# Patient Record
Sex: Female | Born: 1965 | Race: Black or African American | Hispanic: No | Marital: Single
Health system: Midwestern US, Community
[De-identification: ages and names within clinical notes are randomized; demographics above are authoritative.]

## PROBLEM LIST (undated history)

## (undated) DIAGNOSIS — Z87891 Personal history of nicotine dependence: Secondary | ICD-10-CM

## (undated) DIAGNOSIS — F319 Bipolar disorder, unspecified: Secondary | ICD-10-CM

## (undated) DIAGNOSIS — Z7901 Long term (current) use of anticoagulants: Secondary | ICD-10-CM

## (undated) DIAGNOSIS — I2699 Other pulmonary embolism without acute cor pulmonale: Secondary | ICD-10-CM

## (undated) DIAGNOSIS — K219 Gastro-esophageal reflux disease without esophagitis: Secondary | ICD-10-CM

## (undated) DIAGNOSIS — Z8739 Personal history of other diseases of the musculoskeletal system and connective tissue: Secondary | ICD-10-CM

## (undated) DIAGNOSIS — M545 Low back pain, unspecified: Secondary | ICD-10-CM

## (undated) DIAGNOSIS — J309 Allergic rhinitis, unspecified: Secondary | ICD-10-CM

## (undated) DIAGNOSIS — I509 Heart failure, unspecified: Secondary | ICD-10-CM

## (undated) DIAGNOSIS — I10 Essential (primary) hypertension: Secondary | ICD-10-CM

## (undated) DIAGNOSIS — M5136 Other intervertebral disc degeneration, lumbar region: Secondary | ICD-10-CM

## (undated) DIAGNOSIS — M51369 Other intervertebral disc degeneration, lumbar region without mention of lumbar back pain or lower extremity pain: Secondary | ICD-10-CM

## (undated) DIAGNOSIS — G8929 Other chronic pain: Secondary | ICD-10-CM

## (undated) DIAGNOSIS — J449 Chronic obstructive pulmonary disease, unspecified: Secondary | ICD-10-CM

## (undated) DIAGNOSIS — M797 Fibromyalgia: Secondary | ICD-10-CM

## (undated) DIAGNOSIS — E669 Obesity, unspecified: Secondary | ICD-10-CM

## (undated) DIAGNOSIS — M419 Scoliosis, unspecified: Secondary | ICD-10-CM

## (undated) DIAGNOSIS — E66811 Obesity, class 1: Secondary | ICD-10-CM

## (undated) DIAGNOSIS — E785 Hyperlipidemia, unspecified: Secondary | ICD-10-CM

## (undated) DIAGNOSIS — I2510 Atherosclerotic heart disease of native coronary artery without angina pectoris: Secondary | ICD-10-CM

## (undated) DIAGNOSIS — I2602 Saddle embolus of pulmonary artery with acute cor pulmonale: Principal | ICD-10-CM

## (undated) DIAGNOSIS — R319 Hematuria, unspecified: Secondary | ICD-10-CM

## (undated) DIAGNOSIS — R52 Pain, unspecified: Secondary | ICD-10-CM

## (undated) HISTORY — DX: Hyperlipidemia, unspecified: E78.5

## (undated) HISTORY — PX: ABDOMINAL HYSTERECTOMY: SHX81

## (undated) HISTORY — DX: Other chronic pain: G89.29

## (undated) HISTORY — DX: Gastro-esophageal reflux disease without esophagitis: K21.9

## (undated) HISTORY — DX: Low back pain, unspecified: M54.50

## (undated) HISTORY — PX: CARDIAC SURGERY: SHX584

## (undated) HISTORY — DX: Obesity, class 1: E66.811

## (undated) HISTORY — DX: Other intervertebral disc degeneration, lumbar region without mention of lumbar back pain or lower extremity pain: M51.369

## (undated) HISTORY — DX: Personal history of nicotine dependence: Z87.891

## (undated) HISTORY — DX: Allergic rhinitis, unspecified: J30.9

## (undated) HISTORY — DX: Atherosclerotic heart disease of native coronary artery without angina pectoris: I25.10

## (undated) HISTORY — DX: Scoliosis, unspecified: M41.9

## (undated) HISTORY — PX: ABDOMINAL SURGERY: SHX537

## (undated) HISTORY — DX: Obesity, unspecified: E66.9

## (undated) HISTORY — DX: Other intervertebral disc degeneration, lumbar region: M51.36

## (undated) HISTORY — DX: Low back pain: M54.5

## (undated) HISTORY — DX: Long term (current) use of anticoagulants: Z79.01

---

## 2009-11-14 LAB — COMPREHENSIVE METABOLIC PANEL
ALT: 12 U/L (ref 0–63)
AST: 24 U/L (ref 0–35)
Albumin: 4.1 g/dL (ref 3.5–5.2)
Alk Phosphatase: 81 U/L (ref 40–135)
Anion Gap: 7 mEq/L (ref 7–13)
BUN: 6 mg/dL — ABNORMAL LOW (ref 10–26)
CO2: 20 mEq/L — ABNORMAL LOW (ref 22–32)
Calcium: 9 mg/dL (ref 8.5–10.2)
Chloride: 115 mEq/L — ABNORMAL HIGH (ref 99–111)
Creatinine: 0.78 mg/dL (ref 0.50–1.10)
GFR African American: 96.9
GFR Non-African American: 80.1
Globulin: 2.9 g/dL (ref 2.3–3.5)
Glucose: 88 mg/dL (ref 60–115)
Potassium: 4.5 mEq/L (ref 3.5–5.5)
Sodium: 142 mEq/L (ref 135–146)
Total Bilirubin: 0.2 mg/dL (ref 0.2–1.1)
Total Protein: 7 g/dL (ref 6.4–8.1)

## 2009-12-02 LAB — CBC
HCT: 33.5 % — ABNORMAL LOW (ref 37.0–47.0)
Hemoglobin: 11.3 g/dL — ABNORMAL LOW (ref 12.0–16.0)
MCH: 31.3 pg — ABNORMAL HIGH (ref 27.0–31.0)
MCHC: 33.8 % (ref 33.0–37.0)
MCV: 92.7 fL (ref 82.0–100.0)
MPV: 6.9 fL — ABNORMAL LOW (ref 7.4–10.4)
Platelets: 299 10*3/uL (ref 130–400)
RBC: 3.61 10*6/uL — ABNORMAL LOW (ref 4.20–5.40)
RDW: 17.1 % — ABNORMAL HIGH (ref 11.5–14.5)
WBC: 5 10*3/uL (ref 4.8–10.8)

## 2009-12-02 LAB — BASIC METABOLIC PANEL
Anion Gap: 6 mEq/L — ABNORMAL LOW (ref 7–13)
BUN: 10 mg/dL (ref 10–26)
CO2: 24 mEq/L (ref 22–32)
Calcium: 9 mg/dL (ref 8.5–10.2)
Chloride: 114 mEq/L — ABNORMAL HIGH (ref 99–111)
Creatinine: 0.76 mg/dL (ref 0.50–1.10)
GFR African American: 99.8
GFR Non-African American: 82.5
Glucose: 88 mg/dL (ref 60–115)
Potassium: 4.1 mEq/L (ref 3.5–5.5)
Sodium: 144 mEq/L (ref 135–146)

## 2009-12-09 ENCOUNTER — Ambulatory Visit: Admit: 2009-12-09 | Disposition: A | Discharge status: Home / Self Care

## 2009-12-09 LAB — APTT: PTT: 32.7 s (ref 23.5–33.0)

## 2009-12-09 LAB — PROTIME-INR
INR: 1.2
Protime: 11.9 s (ref 9.5–12.5)

## 2009-12-10 LAB — CBC WITH DIFFERENTIAL
Basophils %: 0.3 % (ref 0.0–2.0)
Basophils Absolute: 0 10*3/uL (ref 0.0–0.2)
Eosinophils %: 2.1 % (ref 0.0–4.0)
Eosinophils Absolute: 0.2 10*3/uL (ref 0.0–0.7)
Granulocyte Absolute Count: 5.2 10*3/uL (ref 1.4–6.5)
Granulocytes, Relative Percent: 72 % (ref 42.2–75.2)
HCT: 26.6 % — ABNORMAL LOW (ref 37.0–47.0)
Hemoglobin: 9.2 g/dL — ABNORMAL LOW (ref 12.0–16.0)
Lymphocytes %: 18.3 % — ABNORMAL LOW (ref 20.5–51.1)
Lymphocytes Absolute: 1.3 10*3/uL (ref 1.0–4.8)
MCH: 31.8 pg — ABNORMAL HIGH (ref 27.0–31.0)
MCHC: 34.5 % (ref 33.0–37.0)
MCV: 92.3 fL (ref 82.0–100.0)
MPV: 6.6 fL — ABNORMAL LOW (ref 7.4–10.4)
Monocytes %: 7.3 % (ref 2.0–11.0)
Monocytes Absolute: 0.5 10*3/uL (ref 0.2–0.8)
Platelets: 231 10*3/uL (ref 130–400)
RBC: 2.89 10*6/uL — ABNORMAL LOW (ref 4.20–5.40)
RDW: 16.5 % — ABNORMAL HIGH (ref 11.5–14.5)
WBC: 7.2 10*3/uL (ref 4.8–10.8)

## 2009-12-10 LAB — LUTEINIZING HORMONE: LH: 45.8 m[IU]/mL

## 2009-12-10 LAB — FOLLICLE STIMULATING HORMONE: FSH: 74.7 m[IU]/mL

## 2010-01-23 LAB — COMPREHENSIVE METABOLIC PANEL
ALT: 9 U/L (ref 0–63)
AST: 16 U/L (ref 0–35)
Albumin: 4.5 g/dL (ref 3.5–5.2)
Alk Phosphatase: 88 U/L (ref 40–135)
Anion Gap: 9 mEq/L (ref 7–13)
BUN: 7 mg/dL — ABNORMAL LOW (ref 10–26)
CO2: 22 mEq/L (ref 22–32)
Calcium: 9 mg/dL (ref 8.5–10.2)
Chloride: 112 mEq/L — ABNORMAL HIGH (ref 99–111)
Creatinine: 0.83 mg/dL (ref 0.50–1.10)
GFR African American: 90.1
GFR Non-African American: 74.5
Globulin: 3 g/dL (ref 2.3–3.5)
Glucose: 92 mg/dL (ref 60–115)
Potassium: 4.3 mEq/L (ref 3.5–5.5)
Sodium: 143 mEq/L (ref 135–146)
Total Bilirubin: 0.3 mg/dL (ref 0.2–1.1)
Total Protein: 7.5 g/dL (ref 6.4–8.1)

## 2010-05-07 LAB — CBC WITH DIFFERENTIAL
Basophils %: 0.7 % (ref 0.0–2.0)
Basophils Absolute: 0 10*3/uL (ref 0.0–0.2)
Eosinophils %: 3.3 % (ref 0.0–4.0)
Eosinophils Absolute: 0.2 10*3/uL (ref 0.0–0.7)
Granulocyte Absolute Count: 2 10*3/uL (ref 1.4–6.5)
Granulocytes, Relative Percent: 41 % — ABNORMAL LOW (ref 42.2–75.2)
HCT: 35.1 % — ABNORMAL LOW (ref 37.0–47.0)
Hemoglobin: 11.6 g/dL — ABNORMAL LOW (ref 12.0–16.0)
Lymphocytes %: 47.7 % (ref 20.5–51.1)
Lymphocytes Absolute: 2.3 10*3/uL (ref 1.0–4.8)
MCH: 30.1 pg (ref 27.0–31.0)
MCHC: 33 % (ref 33.0–37.0)
MCV: 91.2 fL (ref 82.0–100.0)
MPV: 7 fL — ABNORMAL LOW (ref 7.4–10.4)
Monocytes %: 7.3 % (ref 2.0–11.0)
Monocytes Absolute: 0.4 10*3/uL (ref 0.2–0.8)
Platelets: 282 10*3/uL (ref 130–400)
RBC: 3.85 10*6/uL — ABNORMAL LOW (ref 4.20–5.40)
RDW: 16.9 % — ABNORMAL HIGH (ref 11.5–14.5)
WBC: 4.9 10*3/uL (ref 4.8–10.8)

## 2010-05-07 LAB — CREATINE KINASE: Total CK: 145 U/L (ref 26–174)

## 2010-05-07 LAB — MAGNESIUM: Magnesium: 2.3 mg/dL (ref 1.4–2.6)

## 2010-05-07 LAB — COMPREHENSIVE METABOLIC PANEL
ALT: 5 U/L (ref 0–63)
AST: 17 U/L (ref 0–35)
Albumin: 4.3 g/dL (ref 3.5–5.2)
Alk Phosphatase: 73 U/L (ref 40–135)
Anion Gap: 6 mEq/L — ABNORMAL LOW (ref 7–13)
BUN: 9 mg/dL — ABNORMAL LOW (ref 10–26)
CO2: 24 mEq/L (ref 22–32)
Calcium: 9.1 mg/dL (ref 8.5–10.2)
Chloride: 114 mEq/L — ABNORMAL HIGH (ref 99–111)
Creatinine: 0.95 mg/dL (ref 0.50–1.10)
GFR African American: 77
GFR Non-African American: 63.6
Globulin: 3.1 g/dL (ref 2.3–3.5)
Glucose: 80 mg/dL (ref 60–115)
Potassium: 4.2 mEq/L (ref 3.5–5.5)
Sodium: 144 mEq/L (ref 135–146)
Total Bilirubin: 0.3 mg/dL (ref 0.2–1.1)
Total Protein: 7.4 g/dL (ref 6.4–8.1)

## 2010-05-07 LAB — PROTIME-INR
INR: 1.5
Protime: 15 s — ABNORMAL HIGH (ref 9.5–12.5)

## 2010-05-07 LAB — URINALYSIS, DIRECTED
Bilirubin, Urine: NEGATIVE
Blood, Urine: NEGATIVE
Glucose, UA: NEGATIVE
Ketones, Urine: NEGATIVE
Leukocyte Esterase, Urine: NEGATIVE
Nitrite, Urine: NEGATIVE
Protein, UA: NEGATIVE
Specific Gravity, UA: 1.01 — ABNORMAL LOW (ref 1.015–1.025)
Urobilinogen, Urine: 1
pH, UA: 6.5

## 2010-05-07 LAB — LIPID PANEL
Chol/HDL Ratio: 6.6
Cholesterol: 318 mg/dL — ABNORMAL HIGH (ref 0–199)
HDL: 48 mg/dL (ref 40–59)
LDL Calculated: 248 mg/dL — ABNORMAL HIGH (ref 0–129)
LDL/HDL Ratio: 5.2 — ABNORMAL HIGH (ref 0.0–2.9)
Triglycerides: 136 mg/dL (ref 0–149)

## 2010-05-07 LAB — APTT: PTT: 29 s (ref 23.5–33.0)

## 2010-05-07 LAB — TROPONIN: Troponin I: 0 ng/mL (ref 0.000–0.040)

## 2010-05-07 LAB — C-REACTIVE PROTEIN HIGH SENSITIVITY: CRP, High Sensitivity: 4.9 mg/L

## 2010-05-07 LAB — BTNP: BTNP: 24 pg/mL

## 2010-05-07 LAB — TSH, HIGH SENSITIVE: TSH: 0.366 u[IU]/mL — ABNORMAL LOW (ref 0.550–4.780)

## 2010-05-12 LAB — POCT CREATININE: POC Creatinine: 1 mg/dL (ref 0.6–1.3)

## 2010-06-05 LAB — COMPREHENSIVE METABOLIC PANEL
ALT: 7 U/L (ref 0–63)
AST: 15 U/L (ref 0–35)
Albumin: 4.2 g/dL (ref 3.5–5.2)
Alk Phosphatase: 71 U/L (ref 40–135)
Anion Gap: 3 mEq/L — ABNORMAL LOW (ref 7–13)
BUN: 8 mg/dL — ABNORMAL LOW (ref 10–26)
CO2: 26 mEq/L (ref 22–32)
Calcium: 8.8 mg/dL (ref 8.5–10.2)
Chloride: 114 mEq/L — ABNORMAL HIGH (ref 99–111)
Creatinine: 0.9 mg/dL (ref 0.50–1.10)
GFR African American: 81.9
GFR Non-African American: 67.7
Globulin: 2.7 g/dL (ref 2.3–3.5)
Glucose: 86 mg/dL (ref 60–115)
Potassium: 3.8 mEq/L (ref 3.5–5.5)
Sodium: 143 mEq/L (ref 135–146)
Total Bilirubin: 0.3 mg/dL (ref 0.2–1.1)
Total Protein: 6.9 g/dL (ref 6.4–8.1)

## 2010-08-31 LAB — CBC WITH DIFFERENTIAL
Basophils %: 0.6 % (ref 0.0–2.0)
Basophils Absolute: 0 10*3/uL (ref 0.0–0.2)
Eosinophils %: 2.5 % (ref 0.0–4.0)
Eosinophils Absolute: 0.1 10*3/uL (ref 0.0–0.7)
Granulocyte Absolute Count: 2.8 10*3/uL (ref 1.4–6.5)
Granulocytes, Relative Percent: 50 % (ref 42.2–75.2)
Hematocrit: 31.6 % — ABNORMAL LOW (ref 37.0–47.0)
Hemoglobin: 10.9 g/dL — ABNORMAL LOW (ref 12.0–16.0)
Lymphocytes %: 40.5 % (ref 20.5–51.1)
Lymphocytes Absolute: 2.3 10*3/uL (ref 1.0–4.8)
MCH: 32.4 pg — ABNORMAL HIGH (ref 27.0–31.0)
MCHC: 34.5 % (ref 33.0–37.0)
MCV: 93.9 fL (ref 82.0–100.0)
MPV: 7.3 fL — ABNORMAL LOW (ref 7.4–10.4)
Monocytes %: 6.4 % (ref 2.0–11.0)
Monocytes Absolute: 0.4 10*3/uL (ref 0.2–0.8)
Platelets: 236 10*3/uL (ref 130–400)
RBC: 3.36 10*6/uL — ABNORMAL LOW (ref 4.20–5.40)
RDW: 16.1 % — ABNORMAL HIGH (ref 11.5–14.5)
WBC: 5.7 10*3/uL (ref 4.8–10.8)

## 2010-08-31 LAB — COMPREHENSIVE METABOLIC PANEL
ALT: 11 U/L (ref 0–63)
AST: 21 U/L (ref 0–35)
Albumin: 4.1 g/dL (ref 3.5–5.2)
Alk Phosphatase: 64 U/L (ref 40–135)
Anion Gap: 7 mEq/L (ref 7–13)
BUN: 9 mg/dL — ABNORMAL LOW (ref 10–26)
CO2: 22 mEq/L (ref 22–32)
Calcium: 9 mg/dL (ref 8.5–10.2)
Chloride: 114 mEq/L — ABNORMAL HIGH (ref 99–111)
Creatinine: 0.86 mg/dL (ref 0.50–1.10)
GFR African American: 86.2
GFR Non-African American: 71.3
Globulin: 2.9 g/dL (ref 2.3–3.5)
Glucose: 86 mg/dL (ref 60–115)
Potassium: 3.8 mEq/L (ref 3.5–5.5)
Sodium: 143 mEq/L (ref 135–146)
Total Bilirubin: 0.3 mg/dL (ref 0.2–1.1)
Total Protein: 7 g/dL (ref 6.4–8.1)

## 2010-08-31 LAB — MULTI DRUG SCREEN, URINE

## 2010-08-31 LAB — LIPID PANEL
Chol/HDL Ratio: 7.3
Cholesterol: 312 mg/dL — ABNORMAL HIGH (ref 0–199)
HDL: 43 mg/dL (ref 40–59)
LDL Calculated: 246 mg/dL — ABNORMAL HIGH (ref 0–129)
LDL/HDL Ratio: 5.7 — ABNORMAL HIGH (ref 0.0–2.9)
Triglycerides: 146 mg/dL (ref 0–149)

## 2010-08-31 LAB — D-DIMER, QUANTITATIVE: D-Dimer, Quant: <0.19 (ref 0.00–0.50)

## 2010-08-31 LAB — PROTIME-INR
INR: 1.9
Protime: 19.6 s — ABNORMAL HIGH (ref 9.5–12.5)

## 2010-08-31 LAB — BTNP: BTNP: 32 pg/mL

## 2010-08-31 LAB — TROPONIN: Troponin I: 0 ng/mL (ref 0.000–0.040)

## 2010-08-31 LAB — MAGNESIUM: Magnesium: 2.1 mg/dL (ref 1.4–2.6)

## 2010-08-31 LAB — APTT: PTT: 26.3 s (ref 23.5–33.0)

## 2010-08-31 LAB — C-REACTIVE PROTEIN HIGH SENSITIVITY: CRP, High Sensitivity: 2.5 mg/L (ref 0.0–5.0)

## 2010-08-31 LAB — CREATINE KINASE: Total CK: 114 U/L (ref 26–174)

## 2010-09-01 LAB — TSH, HIGH SENSITIVE: TSH: 1.029 u[IU]/mL (ref 0.550–4.780)

## 2010-10-03 LAB — CBC
Hematocrit: 35.3 % — ABNORMAL LOW (ref 37.0–47.0)
Hemoglobin: 11.8 g/dL — ABNORMAL LOW (ref 12.0–16.0)
MCH: 31.4 pg — ABNORMAL HIGH (ref 27.0–31.0)
MCHC: 33.3 % (ref 33.0–37.0)
MCV: 94.4 fL (ref 82.0–100.0)
MPV: 7.5 fL (ref 7.4–10.4)
Platelets: 274 10*3/uL (ref 130–400)
RBC: 3.74 10*6/uL — ABNORMAL LOW (ref 4.20–5.40)
RDW: 15.5 % — ABNORMAL HIGH (ref 11.5–14.5)
WBC: 3.9 10*3/uL — ABNORMAL LOW (ref 4.8–10.8)

## 2010-10-03 LAB — COMPREHENSIVE METABOLIC PANEL
ALT: 7 U/L (ref 0–63)
AST: 15 U/L (ref 0–35)
Albumin: 4.1 g/dL (ref 3.5–5.2)
Alk Phosphatase: 50 U/L (ref 40–135)
Anion Gap: 4 mEq/L — ABNORMAL LOW (ref 7–13)
BUN: 10 mg/dL (ref 10–26)
CO2: 23 mEq/L (ref 22–32)
Calcium: 8.6 mg/dL (ref 8.5–10.2)
Chloride: 116 mEq/L — ABNORMAL HIGH (ref 99–111)
Creatinine: 0.79 mg/dL (ref 0.50–1.10)
GFR African American: 95.1
GFR Non-African American: 78.6
Globulin: 2.8 g/dL (ref 2.3–3.5)
Glucose: 80 mg/dL (ref 60–115)
Potassium: 4.3 mEq/L (ref 3.5–5.5)
Sodium: 143 mEq/L (ref 135–146)
Total Bilirubin: 0.4 mg/dL (ref 0.2–1.1)
Total Protein: 6.9 g/dL (ref 6.4–8.1)

## 2010-10-03 LAB — LIPID PANEL
Chol/HDL Ratio: 7
Cholesterol: 331 mg/dL — ABNORMAL HIGH (ref 0–199)
HDL: 47 mg/dL (ref 40–59)
LDL Calculated: 252 mg/dL — ABNORMAL HIGH (ref 0–129)
LDL/HDL Ratio: 5.4 — ABNORMAL HIGH (ref 0.0–2.9)
Triglycerides: 197 mg/dL — ABNORMAL HIGH (ref 0–149)

## 2010-10-03 LAB — TSH, HIGH SENSITIVE: TSH: 1.014 u[IU]/mL (ref 0.550–4.780)

## 2010-11-27 LAB — CBC WITH DIFFERENTIAL
Basophils %: 1.1 % (ref 0.0–2.0)
Basophils Absolute: 0.1 10*3/uL (ref 0.0–0.2)
Eosinophils %: 4.1 % — ABNORMAL HIGH (ref 0.0–4.0)
Eosinophils Absolute: 0.2 10*3/uL (ref 0.0–0.7)
Granulocyte Absolute Count: 1.7 10*3/uL (ref 1.4–6.5)
Granulocytes, Relative Percent: 34.9 % — ABNORMAL LOW (ref 42.2–75.2)
Hematocrit: 33.8 % — ABNORMAL LOW (ref 37.0–47.0)
Hemoglobin: 11 g/dL — ABNORMAL LOW (ref 12.0–16.0)
Lymphocytes %: 52.8 % — ABNORMAL HIGH (ref 20.5–51.1)
Lymphocytes Absolute: 2.6 10*3/uL (ref 1.0–4.8)
MCH: 31 pg (ref 27.0–31.0)
MCHC: 32.6 % — ABNORMAL LOW (ref 33.0–37.0)
MCV: 95 fL (ref 82.0–100.0)
MPV: 7.3 fL — ABNORMAL LOW (ref 7.4–10.4)
Monocytes %: 7.1 % (ref 2.0–11.0)
Monocytes Absolute: 0.4 10*3/uL (ref 0.2–0.8)
Platelets: 247 10*3/uL (ref 130–400)
RBC: 3.56 10*6/uL — ABNORMAL LOW (ref 4.20–5.40)
RDW: 15.6 % — ABNORMAL HIGH (ref 11.5–14.5)
WBC: 4.9 10*3/uL (ref 4.8–10.8)

## 2010-11-27 LAB — PROTIME-INR
INR: 2.2
Protime: 22.7 s — ABNORMAL HIGH (ref 9.5–12.5)

## 2010-11-27 LAB — COMPREHENSIVE METABOLIC PANEL
ALT: 8 U/L (ref 0–63)
AST: 35 U/L (ref 0–35)
Albumin: 4.2 g/dL (ref 3.5–5.2)
Alk Phosphatase: 57 U/L (ref 40–135)
Anion Gap: 2 mEq/L — ABNORMAL LOW (ref 7–13)
BUN: 9 mg/dL — ABNORMAL LOW (ref 10–26)
CO2: 25 mEq/L (ref 22–32)
Calcium: 8.1 mg/dL — ABNORMAL LOW (ref 8.5–10.2)
Chloride: 115 mEq/L — ABNORMAL HIGH (ref 99–111)
Creatinine: 0.87 mg/dL (ref 0.50–1.10)
GFR African American: 85
GFR Non-African American: 70.2
Globulin: 2.7 g/dL (ref 2.3–3.5)
Glucose: 90 mg/dL (ref 60–115)
Potassium: 4.7 mEq/L (ref 3.5–5.5)
Sodium: 142 mEq/L (ref 135–146)
Total Bilirubin: 0.3 mg/dL (ref 0.2–1.1)
Total Protein: 6.9 g/dL (ref 6.4–8.1)

## 2010-11-27 LAB — MULTI DRUG SCREEN, URINE

## 2010-11-27 LAB — URINALYSIS, DIRECTED
Bilirubin Urine: NEGATIVE
Blood, Urine: NEGATIVE
Glucose, Ur: NEGATIVE
Ketones, Urine: NEGATIVE
Leukocyte Esterase, Urine: NEGATIVE
Nitrite, Urine: NEGATIVE
Protein, UA: NEGATIVE
Specific Gravity, UA: 1.01 — ABNORMAL LOW (ref 1.015–1.025)
Urobilinogen, Urine: 1
pH, UA: 7

## 2010-11-27 LAB — CREATINE KINASE: Total CK: 119 U/L (ref 26–174)

## 2010-11-27 LAB — POTASSIUM: Potassium: 3.4 mEq/L — ABNORMAL LOW (ref 3.5–5.5)

## 2010-11-27 LAB — APTT: PTT: 34.3 s — ABNORMAL HIGH (ref 23.5–33.0)

## 2010-11-27 LAB — TSH, HIGH SENSITIVE: TSH: 1.567 u[IU]/mL (ref 0.550–4.780)

## 2010-11-27 LAB — TROPONIN: Troponin I: 0 ng/mL (ref 0.000–0.040)

## 2010-11-27 LAB — ETHANOL: ALCOHOL, PLASMA: <10 mg/dL

## 2010-12-04 LAB — COMPREHENSIVE METABOLIC PANEL
ALT: 10 U/L (ref 0–63)
AST: 18 U/L (ref 0–35)
Albumin: 4.2 g/dL (ref 3.5–5.2)
Alk Phosphatase: 84 U/L (ref 40–135)
Anion Gap: 5 mEq/L — ABNORMAL LOW (ref 7–13)
BUN: 10 mg/dL (ref 10–26)
CO2: 25 mEq/L (ref 22–32)
Calcium: 8.8 mg/dL (ref 8.5–10.2)
Chloride: 117 mEq/L — ABNORMAL HIGH (ref 99–111)
Creatinine: 0.78 mg/dL (ref 0.50–1.10)
GFR African American: 96.4
GFR Non-African American: 79.7
Globulin: 2.7 g/dL (ref 2.3–3.5)
Glucose: 91 mg/dL (ref 60–115)
Potassium: 3.9 mEq/L (ref 3.5–5.5)
Sodium: 147 mEq/L — ABNORMAL HIGH (ref 135–146)
Total Bilirubin: 0.2 mg/dL (ref 0.2–1.1)
Total Protein: 6.9 g/dL (ref 6.4–8.1)

## 2011-01-14 LAB — CBC WITH DIFFERENTIAL
Basophils %: 0.5 % (ref 0.0–2.0)
Basophils Absolute: 0 10*3/uL (ref 0.0–0.2)
Eosinophils %: 2.3 % (ref 0.0–4.0)
Eosinophils Absolute: 0.1 10*3/uL (ref 0.0–0.7)
Granulocyte Absolute Count: 3.6 10*3/uL (ref 1.4–6.5)
Granulocytes, Relative Percent: 58.3 % (ref 42.2–75.2)
Hematocrit: 35.2 % — ABNORMAL LOW (ref 37.0–47.0)
Hemoglobin: 11.7 g/dL — ABNORMAL LOW (ref 12.0–16.0)
Lymphocytes %: 32.8 % (ref 20.5–51.1)
Lymphocytes Absolute: 2 10*3/uL (ref 1.0–4.8)
MCH: 31.5 pg — ABNORMAL HIGH (ref 27.0–31.0)
MCHC: 33.1 % (ref 33.0–37.0)
MCV: 95 fL (ref 82.0–100.0)
MPV: 7.5 fL (ref 7.4–10.4)
Monocytes %: 6.1 % (ref 2.0–11.0)
Monocytes Absolute: 0.4 10*3/uL (ref 0.2–0.8)
Platelets: 243 10*3/uL (ref 130–400)
RBC: 3.71 10*6/uL — ABNORMAL LOW (ref 4.20–5.40)
RDW: 15.5 % — ABNORMAL HIGH (ref 11.5–14.5)
WBC: 6.1 10*3/uL (ref 4.8–10.8)

## 2011-01-14 LAB — COMPREHENSIVE METABOLIC PANEL
ALT: 9 U/L (ref 0–63)
AST: 20 U/L (ref 0–35)
Albumin: 4.4 g/dL (ref 3.5–5.2)
Alk Phosphatase: 83 U/L (ref 40–135)
Anion Gap: 3 mEq/L — ABNORMAL LOW (ref 7–13)
BUN: 7 mg/dL — ABNORMAL LOW (ref 10–26)
CO2: 22 mEq/L (ref 22–32)
Calcium: 8.7 mg/dL (ref 8.5–10.2)
Chloride: 117 mEq/L — ABNORMAL HIGH (ref 99–111)
Creatinine: 0.82 mg/dL (ref 0.50–1.10)
GFR African American: 91
GFR Non-African American: 75.2
Globulin: 2.6 g/dL (ref 2.3–3.5)
Glucose: 79 mg/dL (ref 60–115)
Potassium: 4.4 mEq/L (ref 3.5–5.5)
Sodium: 142 mEq/L (ref 135–146)
Total Bilirubin: 0.2 mg/dL (ref 0.2–1.1)
Total Protein: 7 g/dL (ref 6.4–8.1)

## 2011-01-14 LAB — SEDIMENTATION RATE: Sed Rate: 14 mm/hr (ref 0–20)

## 2011-01-14 LAB — LACTATE DEHYDROGENASE: LD: 182 U/L (ref 110–240)

## 2011-02-28 LAB — COMPREHENSIVE METABOLIC PANEL
ALT: 9 U/L (ref 0–63)
AST: 18 U/L (ref 0–35)
Albumin: 4.5 g/dL (ref 3.5–5.2)
Alk Phosphatase: 78 U/L (ref 40–135)
Anion Gap: 7 mEq/L (ref 7–13)
BUN: 12 mg/dL (ref 10–26)
CO2: 20 mEq/L — ABNORMAL LOW (ref 22–32)
Calcium: 9.2 mg/dL (ref 8.5–10.2)
Chloride: 115 mEq/L — ABNORMAL HIGH (ref 99–111)
Creatinine: 0.72 mg/dL (ref 0.50–1.10)
GFR African American: 105.6
GFR Non-African American: 87.3
Globulin: 2.9 g/dL (ref 2.3–3.5)
Glucose: 103 mg/dL (ref 60–115)
Potassium: 4.1 mEq/L (ref 3.5–5.5)
Sodium: 142 mEq/L (ref 135–146)
Total Bilirubin: 0.2 mg/dL (ref 0.2–1.1)
Total Protein: 7.4 g/dL (ref 6.4–8.1)

## 2011-09-05 LAB — URINALYSIS, DIRECTED
Bilirubin Urine: NEGATIVE
Blood, Urine: NEGATIVE
Glucose, Ur: NEGATIVE mg/dL
Ketones, Urine: NEGATIVE mg/dL
Leukocyte Esterase, Urine: NEGATIVE
Nitrite, Urine: NEGATIVE
Protein, UA: NEGATIVE mg/dL
Specific Gravity, UA: 1.012 (ref 1.005–1.030)
Urobilinogen, Urine: 0.2 E.U./dL (ref <2.0)
pH, UA: 5.5 (ref 5.0–9.0)

## 2011-09-06 LAB — CBC WITH DIFFERENTIAL
Basophils %: 1.1 %
Basophils Absolute: 0.1 10*3/uL (ref 0.0–0.2)
Eosinophils %: 3.3 %
Eosinophils Absolute: 0.2 10*3/uL (ref 0.0–0.7)
Hematocrit: 34.7 % — ABNORMAL LOW (ref 37.0–47.0)
Hemoglobin: 11.3 g/dL — ABNORMAL LOW (ref 12.0–16.0)
Lymphocytes %: 53.8 %
Lymphocytes Absolute: 2.7 10*3/uL (ref 1.0–4.8)
MCH: 30.9 pg (ref 27.0–31.3)
MCHC: 32.6 % — ABNORMAL LOW (ref 33.0–37.0)
MCV: 94.7 fL (ref 82.0–100.0)
MPV: 7.1 fL — ABNORMAL LOW (ref 7.4–10.4)
Monocytes %: 7.4 %
Monocytes Absolute: 0.4 10*3/uL (ref 0.2–0.8)
Neutrophils %: 34.4 %
Neutrophils Absolute: 1.7 10*3/uL (ref 1.4–6.5)
Platelets: 220 10*3/uL (ref 130–400)
RBC: 3.66 M/uL — ABNORMAL LOW (ref 4.20–5.40)
RDW: 14.7 % — ABNORMAL HIGH (ref 11.5–14.5)
WBC: 5 10*3/uL (ref 4.8–10.8)

## 2011-09-06 LAB — COMPREHENSIVE METABOLIC PANEL
ALT: 5 U/L (ref 0–63)
AST: 17 U/L (ref 0–35)
Albumin: 3.9 g/dL (ref 3.5–5.2)
Alkaline Phosphatase: 61 U/L (ref 40–135)
Anion Gap: 7 mEq/L (ref 7–13)
BUN: 10 mg/dL (ref 10–26)
CO2: 21 mEq/L — ABNORMAL LOW (ref 22–32)
Calcium: 9.1 mg/dL (ref 8.5–10.2)
Chloride: 115 mEq/L — ABNORMAL HIGH (ref 99–111)
Creatinine: 0.77 mg/dL (ref 0.50–1.10)
GFR African American: >60 (ref >60)
GFR Non-African American: >60 (ref >60)
Globulin: 2.7 g/dL (ref 2.3–3.5)
Glucose: 82 mg/dL (ref 60–115)
Potassium: 4.3 mEq/L (ref 3.5–5.5)
Sodium: 143 mEq/L (ref 135–146)
Total Bilirubin: 0.3 mg/dL (ref 0.2–1.1)
Total Protein: 6.6 g/dL (ref 6.4–8.1)

## 2011-09-06 LAB — PROTIME-INR
INR: 1.2
Protime: 12.1 s — ABNORMAL HIGH (ref 9.7–11.4)

## 2011-09-06 LAB — LIPID PANEL
Cholesterol, Total: 235 mg/dL — ABNORMAL HIGH (ref 0–199)
HDL: 36 mg/dL — ABNORMAL LOW (ref 40–59)
LDL Calculated: 177 mg/dL — ABNORMAL HIGH (ref 0–129)
Triglycerides: 110 mg/dL (ref 0–149)

## 2011-09-06 LAB — CREATINE KINASE: Total CK: 150 U/L (ref 26–174)

## 2011-09-06 LAB — MAGNESIUM: Magnesium: 2.1 mg/dL (ref 1.4–2.6)

## 2011-09-06 LAB — TROPONIN: Troponin I: 0.023 ng/dL (ref 0.000–0.040)

## 2011-09-06 LAB — APTT: aPTT: 26.7 s (ref 23.5–33.0)

## 2011-09-06 LAB — BTNP: BNP: 11 pg/mL

## 2011-09-06 LAB — TSH, HIGH SENSITIVE: TSH: 1.065 u[IU]/mL (ref 0.550–4.780)

## 2011-10-02 LAB — CBC WITH DIFFERENTIAL
Basophils %: 0.3 %
Basophils Absolute: 0 10*3/uL (ref 0.0–0.2)
Eosinophils %: 1.1 %
Eosinophils Absolute: 0.1 10*3/uL (ref 0.0–0.7)
Hematocrit: 33.5 % — ABNORMAL LOW (ref 37.0–47.0)
Hemoglobin: 11.2 g/dL — ABNORMAL LOW (ref 12.0–16.0)
Lymphocytes %: 31.8 %
Lymphocytes Absolute: 2.1 10*3/uL (ref 1.0–4.8)
MCH: 31.7 pg — ABNORMAL HIGH (ref 27.0–31.3)
MCHC: 33.5 % (ref 33.0–37.0)
MCV: 94.6 fL (ref 82.0–100.0)
MPV: 7.1 fL — ABNORMAL LOW (ref 7.4–10.4)
Monocytes %: 7.2 %
Monocytes Absolute: 0.5 10*3/uL (ref 0.2–0.8)
Neutrophils %: 59.6 %
Neutrophils Absolute: 4 10*3/uL (ref 1.4–6.5)
Platelets: 281 10*3/uL (ref 130–400)
RBC: 3.54 M/uL — ABNORMAL LOW (ref 4.20–5.40)
RDW: 14 % (ref 11.5–14.5)
WBC: 6.7 10*3/uL (ref 4.8–10.8)

## 2011-10-02 LAB — COMPREHENSIVE METABOLIC PANEL
ALT: 6 U/L (ref 0–63)
AST: 13 U/L (ref 0–35)
Albumin: 3.6 g/dL (ref 3.5–5.2)
Alkaline Phosphatase: 71 U/L (ref 40–135)
Anion Gap: 6 mEq/L — ABNORMAL LOW (ref 7–13)
BUN: 9 mg/dL — ABNORMAL LOW (ref 10–26)
CO2: 27 mEq/L (ref 22–32)
Calcium: 8.2 mg/dL — ABNORMAL LOW (ref 8.5–10.2)
Chloride: 108 mEq/L (ref 99–111)
Creatinine: 0.67 mg/dL (ref 0.50–1.10)
GFR African American: >60 (ref >60)
GFR Non-African American: >60 (ref >60)
Globulin: 2.7 g/dL (ref 2.3–3.5)
Glucose: 80 mg/dL (ref 60–115)
Potassium: 4.3 mEq/L (ref 3.5–5.5)
Sodium: 141 mEq/L (ref 135–146)
Total Bilirubin: 0.3 mg/dL (ref 0.2–1.1)
Total Protein: 6.3 g/dL — ABNORMAL LOW (ref 6.4–8.1)

## 2011-10-02 LAB — URINALYSIS, DIRECTED
Bilirubin Urine: NEGATIVE
Blood, Urine: NEGATIVE
Glucose, Ur: NEGATIVE mg/dL
Ketones, Urine: NEGATIVE mg/dL
Leukocyte Esterase, Urine: NEGATIVE
Nitrite, Urine: NEGATIVE
Protein, UA: NEGATIVE mg/dL
Specific Gravity, UA: 1.006 (ref 1.005–1.030)
Urobilinogen, Urine: 0.2 E.U./dL (ref <2.0)
pH, UA: 6 (ref 5.0–9.0)

## 2011-10-02 LAB — MULTI DRUG SCREEN, URINE
Amphetamine Screen, Urine: NEGATIVE (ref <1000)
Barbiturate Screen, Ur: POSITIVE — AB (ref <200)
Benzodiazepine Screen, Urine: NEGATIVE (ref <200)
Cannabinoid Scrn, Ur: NEGATIVE
Cocaine Metabolite Screen, Urine: NEGATIVE (ref <300)
Opiate Scrn, Ur: NEGATIVE
PCP Screen, Urine: NEGATIVE (ref <25)
Tricyclic: NEGATIVE

## 2011-10-02 LAB — LACTIC ACID: Lactic Acid: 0.9 mmol/L (ref 0.3–1.3)

## 2011-10-02 LAB — LIPASE: Lipase: 32 U/L (ref 7–59)

## 2011-12-10 LAB — COMPREHENSIVE METABOLIC PANEL
ALT: 12 U/L (ref 0–63)
AST: 18 U/L (ref 0–35)
Albumin: 4.4 g/dL (ref 3.5–5.2)
Alkaline Phosphatase: 73 U/L (ref 40–135)
Anion Gap: 7 mEq/L (ref 7–13)
BUN: 9 mg/dL — ABNORMAL LOW (ref 10–26)
CO2: 21 mEq/L — ABNORMAL LOW (ref 22–32)
Calcium: 9.3 mg/dL (ref 8.5–10.2)
Chloride: 112 mEq/L — ABNORMAL HIGH (ref 99–111)
Creatinine: 0.77 mg/dL (ref 0.50–1.10)
GFR African American: >60 (ref >60)
GFR Non-African American: >60 (ref >60)
Globulin: 2.8 g/dL (ref 2.3–3.5)
Glucose: 84 mg/dL (ref 60–115)
Potassium: 3.8 mEq/L (ref 3.5–5.5)
Sodium: 140 mEq/L (ref 135–146)
Total Bilirubin: 0.4 mg/dL (ref 0.2–1.1)
Total Protein: 7.2 g/dL (ref 6.4–8.1)

## 2011-12-10 LAB — HOMOCYSTEINE: Homocysteine: 21.4 umol/L — ABNORMAL HIGH (ref 3.7–13.9)

## 2011-12-11 LAB — COMPREHENSIVE METABOLIC PANEL
ALT: 7 U/L (ref 0–63)
AST: 13 U/L (ref 0–35)
Albumin: 3.9 g/dL (ref 3.5–5.2)
Alkaline Phosphatase: 72 U/L (ref 40–135)
Anion Gap: 5 mEq/L — ABNORMAL LOW (ref 7–13)
BUN: 8 mg/dL — ABNORMAL LOW (ref 10–26)
CO2: 23 mEq/L (ref 22–32)
Calcium: 8.7 mg/dL (ref 8.5–10.2)
Chloride: 116 mEq/L — ABNORMAL HIGH (ref 99–111)
Creatinine: 0.81 mg/dL (ref 0.50–1.10)
GFR African American: >60 (ref >60)
GFR Non-African American: >60 (ref >60)
Globulin: 2.4 g/dL (ref 2.3–3.5)
Glucose: 97 mg/dL (ref 60–115)
Potassium: 3.9 mEq/L (ref 3.5–5.5)
Sodium: 144 mEq/L (ref 135–146)
Total Bilirubin: 0.3 mg/dL (ref 0.2–1.1)
Total Protein: 6.3 g/dL — ABNORMAL LOW (ref 6.4–8.1)

## 2011-12-11 LAB — CBC WITH DIFFERENTIAL
Basophils %: 0.5 %
Basophils Absolute: 0 10*3/uL (ref 0.0–0.2)
Eosinophils %: 2.2 %
Eosinophils Absolute: 0.1 10*3/uL (ref 0.0–0.7)
Hematocrit: 35.4 % — ABNORMAL LOW (ref 37.0–47.0)
Hemoglobin: 12 g/dL (ref 12.0–16.0)
Lymphocytes %: 52.7 %
Lymphocytes Absolute: 3.5 10*3/uL (ref 1.0–4.8)
MCH: 32.9 pg — ABNORMAL HIGH (ref 27.0–31.3)
MCHC: 33.8 % (ref 33.0–37.0)
MCV: 97.4 fL (ref 82.0–100.0)
MPV: 6.7 fL — ABNORMAL LOW (ref 7.4–10.4)
Monocytes %: 7.2 %
Monocytes Absolute: 0.5 10*3/uL (ref 0.2–0.8)
Neutrophils %: 37.4 %
Neutrophils Absolute: 2.5 10*3/uL (ref 1.4–6.5)
Platelets: 245 10*3/uL (ref 130–400)
RBC: 3.64 M/uL — ABNORMAL LOW (ref 4.20–5.40)
RDW: 14.9 % — ABNORMAL HIGH (ref 11.5–14.5)
WBC: 6.6 10*3/uL (ref 4.8–10.8)

## 2011-12-25 LAB — PROTIME-INR
INR: 1.1
Protime: 11.4 s (ref 9.7–11.4)

## 2011-12-25 LAB — APTT: aPTT: 25.9 s (ref 23.5–33.0)

## 2012-01-08 LAB — COMPREHENSIVE METABOLIC PANEL
ALT: 17 U/L (ref 0–63)
AST: 22 U/L (ref 0–35)
Albumin: 3.8 g/dL (ref 3.5–5.2)
Alkaline Phosphatase: 62 U/L (ref 40–135)
Anion Gap: 2 mEq/L — ABNORMAL LOW (ref 7–13)
BUN: 5 mg/dL — ABNORMAL LOW (ref 10–26)
CO2: 21 mEq/L — ABNORMAL LOW (ref 22–32)
Calcium: 9.2 mg/dL (ref 8.5–10.2)
Chloride: 119 mEq/L — ABNORMAL HIGH (ref 99–111)
Creatinine: 0.85 mg/dL (ref 0.50–1.10)
GFR African American: >60 (ref >60)
GFR Non-African American: >60 (ref >60)
Globulin: 2.6 g/dL (ref 2.3–3.5)
Glucose: 94 mg/dL (ref 60–115)
Potassium: 4.2 mEq/L (ref 3.5–5.5)
Sodium: 142 mEq/L (ref 135–146)
Total Bilirubin: 0.3 mg/dL (ref 0.2–1.1)
Total Protein: 6.4 g/dL (ref 6.4–8.1)

## 2012-01-08 LAB — CREATINE KINASE: Total CK: 84 U/L (ref 26–174)

## 2012-01-08 LAB — PROTIME-INR
INR: 2.3
Protime: 24.1 s — ABNORMAL HIGH (ref 9.7–11.4)

## 2012-01-08 LAB — CBC WITH DIFFERENTIAL
Basophils %: 0.4 %
Basophils Absolute: 0 10*3/uL (ref 0.0–0.2)
Eosinophils %: 2.2 %
Eosinophils Absolute: 0.1 10*3/uL (ref 0.0–0.7)
Hematocrit: 35.8 % — ABNORMAL LOW (ref 37.0–47.0)
Hemoglobin: 12.1 g/dL (ref 12.0–16.0)
Lymphocytes %: 44.2 %
Lymphocytes Absolute: 2.4 10*3/uL (ref 1.0–4.8)
MCH: 32.6 pg — ABNORMAL HIGH (ref 27.0–31.3)
MCHC: 33.7 % (ref 33.0–37.0)
MCV: 96.6 fL (ref 82.0–100.0)
MPV: 6.6 fL — ABNORMAL LOW (ref 7.4–10.4)
Monocytes %: 6.4 %
Monocytes Absolute: 0.3 10*3/uL (ref 0.2–0.8)
Neutrophils %: 46.8 %
Neutrophils Absolute: 2.5 10*3/uL (ref 1.4–6.5)
Platelets: 268 10*3/uL (ref 130–400)
RBC: 3.7 M/uL — ABNORMAL LOW (ref 4.20–5.40)
RDW: 13.5 % (ref 11.5–14.5)
WBC: 5.4 10*3/uL (ref 4.8–10.8)

## 2012-01-08 LAB — BTNP: BNP: 17 pg/mL

## 2012-01-08 LAB — LIPID PANEL
Cholesterol, Total: 156 mg/dL (ref 0–199)
HDL: 52 mg/dL (ref 40–59)
LDL Calculated: 93 mg/dL (ref 0–129)
Triglycerides: 53 mg/dL (ref 0–149)

## 2012-01-08 LAB — TSH, HIGH SENSITIVE: TSH: 0.541 u[IU]/mL — ABNORMAL LOW (ref 0.550–4.780)

## 2012-01-08 LAB — MAGNESIUM: Magnesium: 2.2 mg/dL (ref 1.4–2.6)

## 2012-01-08 LAB — TROPONIN: Troponin: 0 ng/dL (ref 0.000–0.040)

## 2012-01-08 LAB — D-DIMER, QUANTITATIVE: D-Dimer, Quant: 0.39 mg/L FEU (ref 0.00–0.50)

## 2012-01-08 LAB — APTT: aPTT: 30.5 s (ref 23.5–33.0)

## 2012-01-08 LAB — C-REACTIVE PROTEIN HIGH SENSITIVITY: CRP High Sensitivity: 0.3 mg/L (ref 0.0–5.0)

## 2012-06-08 LAB — COMPREHENSIVE METABOLIC PANEL
ALT: 7 U/L (ref 0–33)
AST: 16 U/L (ref 0–35)
Albumin: 4.5 g/dL (ref 3.9–4.9)
Alkaline Phosphatase: 63 U/L (ref 40–130)
Anion Gap: 11 mEq/L (ref 7–13)
BUN: 10 mg/dL (ref 6–20)
CO2: 20 mEq/L — ABNORMAL LOW (ref 22–29)
Calcium: 9 mg/dL (ref 8.6–10.2)
Chloride: 111 mEq/L — ABNORMAL HIGH (ref 98–107)
Creatinine: 0.77 mg/dL (ref 0.50–0.90)
GFR African American: >60 (ref >60)
GFR Non-African American: >60 (ref >60)
Globulin: 2.1 g/dL — ABNORMAL LOW (ref 2.3–3.5)
Glucose: 95 mg/dL (ref 74–109)
Potassium: 3.9 mEq/L (ref 3.5–5.1)
Sodium: 142 mEq/L (ref 132–144)
Total Bilirubin: 0.2 mg/dL (ref 0.0–1.2)
Total Protein: 6.6 g/dL (ref 6.4–8.1)

## 2012-06-22 LAB — CBC WITH DIFFERENTIAL
Basophils %: 0.9 %
Basophils Absolute: 0 10*3/uL (ref 0.0–0.2)
Eosinophils %: 2.4 %
Eosinophils Absolute: 0.1 10*3/uL (ref 0.0–0.7)
Hematocrit: 33.7 % — ABNORMAL LOW (ref 37.0–47.0)
Hemoglobin: 11.5 g/dL — ABNORMAL LOW (ref 12.0–16.0)
Lymphocytes %: 56.3 %
Lymphocytes Absolute: 2.8 10*3/uL (ref 1.0–4.8)
MCH: 31.4 pg — ABNORMAL HIGH (ref 27.0–31.3)
MCHC: 34.2 % (ref 33.0–37.0)
MCV: 91.9 fL (ref 82.0–100.0)
MPV: 6.9 fL — ABNORMAL LOW (ref 7.4–10.4)
Monocytes %: 8.2 %
Monocytes Absolute: 0.4 10*3/uL (ref 0.2–0.8)
Neutrophils %: 32.2 %
Neutrophils Absolute: 1.6 10*3/uL (ref 1.4–6.5)
Platelets: 221 10*3/uL (ref 130–400)
RBC: 3.67 M/uL — ABNORMAL LOW (ref 4.20–5.40)
RDW: 13.3 % (ref 11.5–14.5)
WBC: 5 10*3/uL (ref 4.8–10.8)

## 2012-06-22 LAB — COMPREHENSIVE METABOLIC PANEL
ALT: 9 U/L (ref 0–33)
AST: 39 U/L — ABNORMAL HIGH (ref 0–35)
Albumin: 4.3 g/dL (ref 3.9–4.9)
Alkaline Phosphatase: 62 U/L (ref 40–130)
Anion Gap: 9 mEq/L (ref 7–13)
BUN: 8 mg/dL (ref 6–20)
CO2: 23 mEq/L (ref 22–29)
Calcium: 8.8 mg/dL (ref 8.6–10.2)
Chloride: 107 mEq/L (ref 98–107)
Creatinine: 0.68 mg/dL (ref 0.50–0.90)
GFR African American: >60 (ref >60)
GFR Non-African American: >60 (ref >60)
Globulin: 2.4 g/dL (ref 2.3–3.5)
Glucose: 91 mg/dL (ref 74–109)
Potassium: 4.5 mEq/L (ref 3.5–5.1)
Sodium: 139 mEq/L (ref 132–144)
Total Bilirubin: 0.3 mg/dL (ref 0.0–1.2)
Total Protein: 6.7 g/dL (ref 6.4–8.1)

## 2012-06-22 LAB — PROTIME-INR
INR: 1.4
Protime: 14.4 s — ABNORMAL HIGH (ref 9.7–11.4)

## 2012-06-22 LAB — APTT: aPTT: 26.6 s (ref 23.5–33.0)

## 2012-09-30 NOTE — Progress Notes (Signed)
Alisha Mccall  47 y.o. female  Referred by: Mal Misty    Medicines, social history, past medical history, surgical history, and allergies have been reviewed and updated as needed.     Chief Complaint   Patient presents with   ??? Abdominal Pain   ??? Diarrhea         HPI:   Alisha Mccall is a 47 year old BF with abdominal pain.  Patient has left sided abdominal pain and bouts of diarrhea.  I saw the patient in 2007 and performed a colonoscopy which was normal with a negative biopsy.  EGD showed a medium sized hiatal hernia.  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        Physical Exam:  BP 125/83   Pulse 67   Wt 156 lb (70.761 kg)  Constitutional:  Patient appears well nourished, well developed, and hydrated. Alert and oriented x3 and appropriate. Non icteric and not pale.  Neck/Thyroid: Neck is supple. No palpable nodules. No carotid bruits. Thyroid is symmetrical, without thyromegaly, masses, or palpable nodules.  Respiratory:  Normal to inspection.  Lungs clear to auscultation and percussion. No wheezes rhonchi or rales.  Cardiovascular:  Regular rate and rhythm.  No murmurs, gallops, or rubs.  Abdomen:  Soft, tender left lateral abdomen and non-distended. No organomegaly. No masses. No rebound or guarding. Normal bowel sounds.  Integumentary:  The skin is unremarkable.  No rashes.  No suspicious lesions. Warm and dry.  Neuro: Grossly intact. Cranial nerves, sensation and reflexes grossly intact.      Assessment:  1. Abdominal pain, left lower quadrant    2. Diarrhea        Plan:  I have asked the patient to bring in the records from her abdominal surgery.  I have advised her to take Metamucil twice a day and I will see her again in one month.      I, Quin Hoop, scribed the above note for Dr Lynnell Catalan. 09/30/2012 4:16 PM             I , Lynnell Catalan, have read and agree with the above documentation.  10/06/2012 3:05 PM

## 2012-10-17 LAB — CBC WITH DIFFERENTIAL
Basophils %: 0.5 %
Basophils %: 0.5 %
Basophils Absolute: 0 10*3/uL (ref 0.0–0.2)
Basophils Absolute: 0 10*3/uL (ref 0.0–0.2)
Eosinophils %: 3 %
Eosinophils %: 2.7 %
Eosinophils Absolute: 0.2 10*3/uL (ref 0.0–0.7)
Eosinophils Absolute: 0.1 10*3/uL (ref 0.0–0.7)
Hematocrit: 33.6 % — ABNORMAL LOW (ref 37.0–47.0)
Hematocrit: 33.8 % — ABNORMAL LOW (ref 37.0–47.0)
Hemoglobin: 11.2 g/dL — ABNORMAL LOW (ref 12.0–16.0)
Hemoglobin: 11.1 g/dL — ABNORMAL LOW (ref 12.0–16.0)
Lymphocytes %: 58.8 %
Lymphocytes %: 68.4 %
Lymphocytes Absolute: 3.1 10*3/uL (ref 1.0–4.8)
Lymphocytes Absolute: 3.1 10*3/uL (ref 1.0–4.8)
MCH: 31.4 pg — ABNORMAL HIGH (ref 27.0–31.3)
MCH: 30.9 pg (ref 27.0–31.3)
MCHC: 33.5 % (ref 33.0–37.0)
MCHC: 32.9 % — ABNORMAL LOW (ref 33.0–37.0)
MCV: 93.9 fL (ref 82.0–100.0)
MCV: 94.1 fL (ref 82.0–100.0)
MPV: 7.1 fL — ABNORMAL LOW (ref 7.4–10.4)
MPV: 8 fL (ref 7.4–10.4)
Monocytes %: 7.6 %
Monocytes %: 5.6 %
Monocytes Absolute: 0.4 10*3/uL (ref 0.2–0.8)
Monocytes Absolute: 0.3 10*3/uL (ref 0.2–0.8)
Neutrophils %: 30.1 %
Neutrophils %: 22.8 %
Neutrophils Absolute: 1.6 10*3/uL (ref 1.4–6.5)
Neutrophils Absolute: 1 10*3/uL — ABNORMAL LOW (ref 1.4–6.5)
Platelets: 215 10*3/uL (ref 130–400)
Platelets: 181 10*3/uL (ref 130–400)
RBC: 3.58 M/uL — ABNORMAL LOW (ref 4.20–5.40)
RBC: 3.6 M/uL — ABNORMAL LOW (ref 4.20–5.40)
RDW: 13.9 % (ref 11.5–14.5)
RDW: 13.8 % (ref 11.5–14.5)
WBC: 5.3 10*3/uL (ref 4.8–10.8)
WBC: 4.5 10*3/uL — ABNORMAL LOW (ref 4.8–10.8)

## 2012-10-17 LAB — COMPREHENSIVE METABOLIC PANEL
ALT: 6 U/L (ref 0–33)
ALT: 6 U/L (ref 0–33)
AST: 15 U/L (ref 0–35)
AST: 23 U/L (ref 0–35)
Albumin: 4.1 g/dL (ref 3.9–4.9)
Albumin: 3.9 g/dL (ref 3.9–4.9)
Alkaline Phosphatase: 52 U/L (ref 40–130)
Alkaline Phosphatase: 45 U/L (ref 40–130)
Anion Gap: 8 mEq/L (ref 7–13)
Anion Gap: 10 mEq/L (ref 7–13)
BUN: 10 mg/dL (ref 6–20)
BUN: 8 mg/dL (ref 6–20)
CO2: 24 mEq/L (ref 22–29)
CO2: 20 mEq/L — ABNORMAL LOW (ref 22–29)
Calcium: 8.8 mg/dL (ref 8.6–10.2)
Calcium: 8.5 mg/dL — ABNORMAL LOW (ref 8.6–10.2)
Chloride: 107 mEq/L (ref 98–107)
Chloride: 108 mEq/L — ABNORMAL HIGH (ref 98–107)
Creatinine: 0.78 mg/dL (ref 0.50–0.90)
Creatinine: 0.62 mg/dL (ref 0.50–0.90)
GFR African American: >60 (ref >60)
GFR African American: >60 (ref >60)
GFR Non-African American: >60 (ref >60)
GFR Non-African American: >60 (ref >60)
Globulin: 1.9 g/dL — ABNORMAL LOW (ref 2.3–3.5)
Globulin: 1.8 g/dL — ABNORMAL LOW (ref 2.3–3.5)
Glucose: 89 mg/dL (ref 74–109)
Glucose: 80 mg/dL (ref 74–109)
Potassium: 3.3 mEq/L — ABNORMAL LOW (ref 3.5–5.1)
Potassium: 4.1 mEq/L (ref 3.5–5.1)
Sodium: 139 mEq/L (ref 132–144)
Sodium: 138 mEq/L (ref 132–144)
Total Bilirubin: 0.2 mg/dL (ref 0.0–1.2)
Total Bilirubin: 0.3 mg/dL (ref 0.0–1.2)
Total Protein: 6 g/dL — ABNORMAL LOW (ref 6.4–8.1)
Total Protein: 5.7 g/dL — ABNORMAL LOW (ref 6.4–8.1)

## 2012-10-17 LAB — TROPONIN
Troponin: <0.01 ng/mL (ref 0.000–0.010)
Troponin: <0.01 ng/mL (ref 0.000–0.010)
Troponin: <0.01 ng/mL (ref 0.000–0.010)

## 2012-10-17 LAB — PROTIME-INR
INR: 1.2
INR: 1.4
Protime: 13.2 s — ABNORMAL HIGH (ref 9.7–11.4)
Protime: 14.7 s — ABNORMAL HIGH (ref 9.7–11.4)

## 2012-10-17 LAB — APTT
aPTT: 26.7 s (ref 23.5–33.0)
aPTT: 30.6 s (ref 23.5–33.0)

## 2012-10-17 LAB — D-DIMER, QUANTITATIVE: D-Dimer, Quant: 0.25 mg/L FEU (ref 0.00–0.50)

## 2012-10-17 LAB — LIPID PANEL
Cholesterol, Total: 157 mg/dL (ref 0–199)
HDL: 49 mg/dL (ref 40–59)
LDL Calculated: 84 mg/dL (ref 0–129)
Triglycerides: 119 mg/dL (ref 0–200)

## 2012-10-17 LAB — MYOGLOBIN, PLASMA: Myoglobin: 24 ng/mL — ABNORMAL LOW (ref 25–58)

## 2012-10-17 LAB — C-REACTIVE PROTEIN HIGH SENSITIVITY: CRP High Sensitivity: 0.7 mg/L (ref 0.0–5.0)

## 2012-10-17 LAB — CREATINE KINASE
Total CK: 90 U/L (ref 0–170)
Total CK: 93 U/L (ref 0–170)
Total CK: 91 U/L (ref 0–170)

## 2012-10-17 LAB — MAGNESIUM: Magnesium: 2.1 mg/dL (ref 1.7–2.3)

## 2012-10-17 LAB — BTNP: Pro-BNP: 86 pg/mL

## 2012-10-17 LAB — TSH, HIGH SENSITIVE: TSH: 2.33 u[IU]/mL (ref 0.270–4.200)

## 2012-11-10 LAB — HOMOCYSTEINE: Homocysteine: 10.3 umol/L (ref 0.0–15.0)

## 2012-12-08 LAB — MULTI DRUG SCREEN, URINE
Amphetamine Screen, Urine: NEGATIVE (ref <1000)
Barbiturate Screen, Ur: NEGATIVE (ref <200)
Benzodiazepine Screen, Urine: NEGATIVE (ref <200)
Cannabinoid Scrn, Ur: NEGATIVE (ref <50)
Cocaine Metabolite Screen, Urine: NEGATIVE (ref <300)
Opiate Scrn, Ur: NEGATIVE (ref <300)
PCP Screen, Urine: NEGATIVE (ref <25)

## 2012-12-08 LAB — CBC WITH DIFFERENTIAL
Basophils %: 1.3 %
Basophils Absolute: 0.1 10*3/uL (ref 0.0–0.2)
Eosinophils %: 0.4 %
Eosinophils Absolute: 0 10*3/uL (ref 0.0–0.7)
Hematocrit: 44 % (ref 37.0–47.0)
Hemoglobin: 14.5 g/dL (ref 12.0–16.0)
Lymphocytes %: 39.5 %
Lymphocytes Absolute: 2.9 10*3/uL (ref 1.0–4.8)
MCH: 30.8 pg (ref 27.0–31.3)
MCHC: 32.9 % — ABNORMAL LOW (ref 33.0–37.0)
MCV: 93.6 fL (ref 82.0–100.0)
MPV: 7.7 fL (ref 7.4–10.4)
Monocytes %: 4.8 %
Monocytes Absolute: 0.4 10*3/uL (ref 0.2–0.8)
Neutrophils %: 54 %
Neutrophils Absolute: 4 10*3/uL (ref 1.4–6.5)
Platelets: 307 10*3/uL (ref 130–400)
RBC: 4.69 M/uL (ref 4.20–5.40)
RDW: 14.1 % (ref 11.5–14.5)
WBC: 7.5 10*3/uL (ref 4.8–10.8)

## 2012-12-08 LAB — TROPONIN: Troponin: <0.01 ng/mL (ref 0.000–0.010)

## 2012-12-08 LAB — URINALYSIS WITH MICROSCOPIC

## 2012-12-08 LAB — URINALYSIS, DIRECTED
Blood, Urine: NEGATIVE
Glucose, Ur: NEGATIVE mg/dL
Nitrite, Urine: NEGATIVE
Protein, UA: NEGATIVE mg/dL
Specific Gravity, UA: 1.026 (ref 1.005–1.030)
Urobilinogen, Urine: 1 E.U./dL (ref <2.0)
pH, UA: 5.5 (ref 5.0–9.0)

## 2012-12-08 LAB — LIPID PANEL
Cholesterol, Total: 175 mg/dL (ref 0–199)
HDL: 46 mg/dL (ref 40–59)
LDL Calculated: 100 mg/dL (ref 0–129)
Triglycerides: 143 mg/dL (ref 0–200)

## 2012-12-08 LAB — COMPREHENSIVE METABOLIC PANEL
ALT: 7 U/L (ref 0–33)
AST: 29 U/L (ref 0–35)
Albumin: 4.5 g/dL (ref 3.9–4.9)
Alkaline Phosphatase: 60 U/L (ref 40–130)
Anion Gap: 12 mEq/L (ref 7–13)
BUN: 15 mg/dL (ref 6–20)
CO2: 17 mEq/L — ABNORMAL LOW (ref 22–29)
Calcium: 9.5 mg/dL (ref 8.6–10.2)
Chloride: 108 mEq/L — ABNORMAL HIGH (ref 98–107)
Creatinine: 0.99 mg/dL — ABNORMAL HIGH (ref 0.50–0.90)
GFR African American: >60 (ref >60)
GFR Non-African American: >60 (ref >60)
Globulin: 2.7 g/dL (ref 2.3–3.5)
Glucose: 96 mg/dL (ref 74–109)
Potassium: 5.2 mEq/L — ABNORMAL HIGH (ref 3.5–5.1)
Sodium: 137 mEq/L (ref 132–144)
Total Bilirubin: 0.2 mg/dL (ref 0.0–1.2)
Total Protein: 7.2 g/dL (ref 6.4–8.1)

## 2012-12-08 LAB — C-REACTIVE PROTEIN HIGH SENSITIVITY: CRP High Sensitivity: 4 mg/L (ref 0.0–5.0)

## 2012-12-08 LAB — BTNP: Pro-BNP: 124 pg/mL

## 2012-12-08 LAB — PROTIME-INR
INR: 1.4
Protime: 15.3 s — ABNORMAL HIGH (ref 9.7–11.4)

## 2012-12-08 LAB — CREATINE KINASE: Total CK: 84 U/L (ref 0–170)

## 2012-12-08 LAB — MAGNESIUM: Magnesium: 2.2 mg/dL (ref 1.7–2.3)

## 2012-12-08 LAB — APTT: aPTT: 22.6 s — ABNORMAL LOW (ref 23.5–33.0)

## 2012-12-08 LAB — TSH, HIGH SENSITIVE: TSH: 0.737 u[IU]/mL (ref 0.270–4.200)

## 2013-03-03 LAB — COMPREHENSIVE METABOLIC PANEL
ALT: 8 U/L (ref 0–33)
AST: 15 U/L (ref 0–35)
Albumin: 4 g/dL (ref 3.9–4.9)
Alkaline Phosphatase: 49 U/L (ref 40–130)
Anion Gap: 11 mEq/L (ref 7–13)
BUN: 9 mg/dL (ref 6–20)
CO2: 20 mEq/L — ABNORMAL LOW (ref 22–29)
Calcium: 8.6 mg/dL (ref 8.6–10.2)
Chloride: 109 mEq/L — ABNORMAL HIGH (ref 98–107)
Creatinine: 0.75 mg/dL (ref 0.50–0.90)
GFR African American: >60 (ref >60)
GFR Non-African American: >60 (ref >60)
Globulin: 2.3 g/dL (ref 2.3–3.5)
Glucose: 80 mg/dL (ref 74–109)
Potassium: 3.9 mEq/L (ref 3.5–5.1)
Sodium: 140 mEq/L (ref 132–144)
Total Bilirubin: 0.3 mg/dL (ref 0.0–1.2)
Total Protein: 6.3 g/dL — ABNORMAL LOW (ref 6.4–8.1)

## 2013-03-04 LAB — HOMOCYSTEINE: Homocysteine: 10 umol/L (ref 0.0–15.0)

## 2013-05-22 LAB — CBC WITH DIFFERENTIAL
Basophils %: 1.4 %
Basophils Absolute: 0.1 10*3/uL (ref 0.0–0.2)
Eosinophils %: 2.7 %
Eosinophils Absolute: 0.1 10*3/uL (ref 0.0–0.7)
Hematocrit: 37.5 % (ref 37.0–47.0)
Hemoglobin: 12.1 g/dL (ref 12.0–16.0)
Lymphocytes %: 44.5 %
Lymphocytes Absolute: 2.4 10*3/uL (ref 1.0–4.8)
MCH: 31.3 pg (ref 27.0–31.3)
MCHC: 32.2 % — ABNORMAL LOW (ref 33.0–37.0)
MCV: 97.2 fL (ref 82.0–100.0)
MPV: 7.3 fL — ABNORMAL LOW (ref 7.4–10.4)
Monocytes %: 5.8 %
Monocytes Absolute: 0.3 10*3/uL (ref 0.2–0.8)
Neutrophils %: 45.6 %
Neutrophils Absolute: 2.5 10*3/uL (ref 1.4–6.5)
Platelets: 196 10*3/uL (ref 130–400)
RBC: 3.86 M/uL — ABNORMAL LOW (ref 4.20–5.40)
RDW: 15 % — ABNORMAL HIGH (ref 11.5–14.5)
WBC: 5.4 10*3/uL (ref 4.8–10.8)

## 2013-05-22 LAB — COMPREHENSIVE METABOLIC PANEL
ALT: 10 U/L (ref 0–33)
AST: 33 U/L (ref 0–35)
Albumin: 3.9 g/dL (ref 3.9–4.9)
Alkaline Phosphatase: 44 U/L (ref 40–130)
Anion Gap: 11 mEq/L (ref 7–13)
BUN: 12 mg/dL (ref 6–20)
CO2: 19 mEq/L — ABNORMAL LOW (ref 22–29)
Calcium: 8.4 mg/dL — ABNORMAL LOW (ref 8.6–10.2)
Chloride: 108 mEq/L — ABNORMAL HIGH (ref 98–107)
Creatinine: 0.64 mg/dL (ref 0.50–0.90)
GFR African American: >60 (ref >60)
GFR Non-African American: >60 (ref >60)
Globulin: 2.4 g/dL (ref 2.3–3.5)
Glucose: 73 mg/dL — ABNORMAL LOW (ref 74–109)
Potassium: 4.6 mEq/L (ref 3.5–5.1)
Sodium: 138 mEq/L (ref 132–144)
Total Bilirubin: 0.3 mg/dL (ref 0.0–1.2)
Total Protein: 6.3 g/dL — ABNORMAL LOW (ref 6.4–8.1)

## 2013-05-22 LAB — URINALYSIS, DIRECTED
Bilirubin Urine: NEGATIVE
Glucose, Ur: NEGATIVE mg/dL
Ketones, Urine: NEGATIVE mg/dL
Nitrite, Urine: NEGATIVE
Protein, UA: NEGATIVE mg/dL
Specific Gravity, UA: 1.026 (ref 1.005–1.030)
Urobilinogen, Urine: 0.2 E.U./dL (ref <2.0)
pH, UA: 6.5 (ref 5.0–9.0)

## 2013-05-22 LAB — URINALYSIS WITH MICROSCOPIC

## 2013-05-22 LAB — LACTIC ACID: Lactic Acid: 0.8 mmol/L (ref 0.5–2.2)

## 2013-05-22 LAB — LIPASE: Lipase: 43 U/L (ref 13–60)

## 2013-05-23 LAB — POCT VENOUS
GFR African American: >60 (ref >60)
GFR Non-African American: >60 (ref >60)
POC Creatinine: 0.8 mg/dL (ref 0.6–1.1)

## 2013-05-24 NOTE — Progress Notes (Signed)
Subjective:      Patient ID: Alisha Mccall is a 48 y.o. female.    HPI  [facility] and abdominal pain.  Patient notes  moderate discomfort in the abdomen.  The pain is located  LUQ.  She complains of nausea and diarrhea. There  is a bulge the epigastric area. The bulge is not getting larger. There is a history of surgery in proximity to the area of discomfort. The patient has known about a hernia for 2 year(s). She is on anticoagulants. She has had a ventral hernia repair in the past but unclear on details of any of her surgeries.  Imaging studies include  CT.  Findings include ventral hernia and gastroenteritis. Bowel function is diarrhea. She is being followed by Dr Lars MageJuan and Dr Ike Beneazack.    Review of Systems   Constitutional: Negative.  Negative for activity change, appetite change and unexpected weight change.   HENT: Negative.  Negative for congestion, nosebleeds, rhinorrhea and sneezing.    Eyes: Negative.  Negative for visual disturbance.   Respiratory: Negative.  Negative for chest tightness and shortness of breath.    Cardiovascular: Negative.  Negative for chest pain and leg swelling.   Gastrointestinal: Negative.  Negative for nausea, abdominal pain, blood in stool, abdominal distention and rectal pain.   Genitourinary: Negative.  Negative for difficulty urinating.   Musculoskeletal: Negative.    Skin: Negative.  Negative for color change.   Neurological: Negative.  Negative for seizures, light-headedness, numbness and headaches.   Hematological: Does not bruise/bleed easily.   Psychiatric/Behavioral: Negative.  Negative for sleep disturbance.       Objective:   Physical Exam   Constitutional: She is oriented to person, place, and time. She appears well-developed and well-nourished. No distress.   HENT:   Head: Normocephalic.   Eyes: Pupils are equal, round, and reactive to light.   Neck: Normal range of motion.   Cardiovascular: Normal rate and  normal heart sounds.    Pulmonary/Chest: Effort normal and breath sounds normal.   Abdominal: Soft. Bowel sounds are normal. There is no hepatosplenomegaly. There is tenderness in the epigastric area and left upper quadrant. A hernia (epigastric incisional tender) is present.   Neurological: She is alert and oriented to person, place, and time.   Skin: Skin is warm.   Psychiatric: She has a normal mood and affect.       Assessment:      Chronic ventral hernia with no obstruction  Abdominal pain      Plan:      Consult Dr Ike Beneazack  The patient is instructed to return to see me in 1 week.   [facility]

## 2013-08-29 LAB — VITAMIN D 25 HYDROXY: Vit D, 25-Hydroxy: 10.6 ng/mL — ABNORMAL LOW (ref 30.0–100.0)

## 2013-11-05 ENCOUNTER — Inpatient Hospital Stay: Admit: 2013-11-05 | Discharge: 2013-11-06 | Disposition: A | Discharge status: Home / Self Care

## 2013-11-05 LAB — CBC WITH DIFFERENTIAL
Basophils %: 0.3 %
Basophils Absolute: 0 10*3/uL (ref 0.0–0.2)
Eosinophils %: 1.9 %
Eosinophils Absolute: 0.1 10*3/uL (ref 0.0–0.7)
Hematocrit: 36.7 % — ABNORMAL LOW (ref 37.0–47.0)
Hemoglobin: 12.1 g/dL (ref 12.0–16.0)
Lymphocytes %: 46.9 %
Lymphocytes Absolute: 2.5 10*3/uL (ref 1.0–4.8)
MCH: 30.7 pg (ref 27.0–31.3)
MCHC: 33.1 % (ref 33.0–37.0)
MCV: 92.8 fL (ref 82.0–100.0)
MPV: 7.1 fL — ABNORMAL LOW (ref 7.4–10.4)
Monocytes %: 6.3 %
Monocytes Absolute: 0.3 10*3/uL (ref 0.2–0.8)
Neutrophils %: 44.6 %
Neutrophils Absolute: 2.4 10*3/uL (ref 1.4–6.5)
Platelets: 247 10*3/uL (ref 130–400)
RBC: 3.96 M/uL — ABNORMAL LOW (ref 4.20–5.40)
RDW: 14 % (ref 11.5–14.5)
WBC: 5.4 10*3/uL (ref 4.8–10.8)

## 2013-11-05 LAB — PROTIME-INR
INR: 1.4
Protime: 15.3 s — ABNORMAL HIGH (ref 9.6–12.3)

## 2013-11-05 LAB — TROPONIN: Troponin: <0.01 ng/mL (ref 0.000–0.010)

## 2013-11-05 LAB — APTT: aPTT: 29.7 s (ref 23.5–33.0)

## 2013-11-05 LAB — BTNP: Pro-BNP: 143 pg/mL (ref 0–450)

## 2013-11-06 LAB — COMPREHENSIVE METABOLIC PANEL
ALT: <5 U/L (ref 0–33)
AST: 15 U/L (ref 0–35)
Albumin: 4.2 g/dL (ref 3.9–4.9)
Alkaline Phosphatase: 53 U/L (ref 40–130)
Anion Gap: 12 mEq/L (ref 7–13)
BUN: 8 mg/dL (ref 6–20)
CO2: 24 mEq/L (ref 22–29)
Calcium: 8.8 mg/dL (ref 8.6–10.2)
Chloride: 104 mEq/L (ref 98–107)
Creatinine: 0.72 mg/dL (ref 0.50–0.90)
GFR African American: >60 (ref >60)
GFR Non-African American: >60 (ref >60)
Globulin: 2.5 g/dL (ref 2.3–3.5)
Glucose: 83 mg/dL (ref 74–109)
Potassium: 3.6 mEq/L (ref 3.5–5.1)
Sodium: 140 mEq/L (ref 132–144)
Total Bilirubin: 0.3 mg/dL (ref 0.0–1.2)
Total Protein: 6.7 g/dL (ref 6.4–8.1)

## 2013-11-06 LAB — TSH, HIGH SENSITIVE: TSH: 1.06 u[IU]/mL (ref 0.270–4.200)

## 2013-11-06 LAB — CREATINE KINASE: Total CK: 70 U/L (ref 0–170)

## 2013-11-06 LAB — D-DIMER, QUANTITATIVE: D-Dimer, Quant: 0.22 mg/L FEU (ref 0.00–0.50)

## 2013-11-06 LAB — C-REACTIVE PROTEIN HIGH SENSITIVITY: CRP High Sensitivity: 0.8 mg/L (ref 0.0–5.0)

## 2013-11-06 LAB — LIPID PANEL
Cholesterol, Total: 207 mg/dL — ABNORMAL HIGH (ref 0–199)
HDL: 53 mg/dL (ref 40–59)
LDL Calculated: 130 mg/dL — ABNORMAL HIGH (ref 0–129)
Triglycerides: 118 mg/dL (ref 0–200)

## 2013-11-06 LAB — MAGNESIUM: Magnesium: 2.1 mg/dL (ref 1.7–2.3)

## 2013-11-06 MED FILL — DIAZEPAM 5 MG/ML IJ SOLN: 5 MG/ML | INTRAMUSCULAR | Qty: 2

## 2013-11-06 MED FILL — SODIUM CHLORIDE 0.9 % IV SOLN: 0.9 % | INTRAVENOUS | Qty: 100

## 2013-11-06 MED FILL — MONOJECT FLUSH SYRINGE 0.9 % IV SOLN: 0.9 % | INTRAVENOUS | Qty: 10

## 2013-11-06 MED FILL — ORPHENADRINE CITRATE 30 MG/ML IJ SOLN: 30 MG/ML | INTRAMUSCULAR | Qty: 2

## 2014-01-26 LAB — COMPREHENSIVE METABOLIC PANEL
ALT: <5 U/L (ref 0–33)
AST: 14 U/L (ref 0–35)
Albumin: 3.8 g/dL — ABNORMAL LOW (ref 3.9–4.9)
Alkaline Phosphatase: 63 U/L (ref 40–130)
Anion Gap: 14 mEq/L — ABNORMAL HIGH (ref 7–13)
BUN: 8 mg/dL (ref 6–20)
CO2: 20 mEq/L — ABNORMAL LOW (ref 22–29)
Calcium: 8.4 mg/dL — ABNORMAL LOW (ref 8.6–10.2)
Chloride: 107 mEq/L (ref 98–107)
Creatinine: 0.66 mg/dL (ref 0.50–0.90)
GFR African American: >60 (ref >60)
GFR Non-African American: >60 (ref >60)
Globulin: 2.4 g/dL (ref 2.3–3.5)
Glucose: 87 mg/dL (ref 74–109)
Potassium: 4.1 mEq/L (ref 3.5–5.1)
Sodium: 141 mEq/L (ref 132–144)
Total Bilirubin: 0.2 mg/dL (ref 0.0–1.2)
Total Protein: 6.2 g/dL — ABNORMAL LOW (ref 6.4–8.1)

## 2014-01-26 LAB — HOMOCYSTEINE: Homocysteine: 11.6 umol/L (ref 0.0–15.0)

## 2014-02-06 LAB — CBC WITH DIFFERENTIAL
Basophils %: 0.5 %
Basophils Absolute: 0 10*3/uL (ref 0.0–0.2)
Eosinophils %: 2.6 %
Eosinophils Absolute: 0.1 10*3/uL (ref 0.0–0.7)
Hematocrit: 38.1 % (ref 37.0–47.0)
Hemoglobin: 12.4 g/dL (ref 12.0–16.0)
Lymphocytes %: 47.2 %
Lymphocytes Absolute: 2.3 10*3/uL (ref 1.0–4.8)
MCH: 31.2 pg (ref 27.0–31.3)
MCHC: 32.6 % — ABNORMAL LOW (ref 33.0–37.0)
MCV: 95.8 fL (ref 82.0–100.0)
Monocytes %: 10.3 %
Monocytes Absolute: 0.5 10*3/uL (ref 0.2–0.8)
Neutrophils %: 39.4 %
Neutrophils Absolute: 1.9 10*3/uL (ref 1.4–6.5)
Platelets: 233 10*3/uL (ref 130–400)
RBC: 3.98 M/uL — ABNORMAL LOW (ref 4.20–5.40)
RDW: 14.3 % (ref 11.5–14.5)
WBC: 4.8 10*3/uL (ref 4.8–10.8)

## 2014-02-06 LAB — COMPREHENSIVE METABOLIC PANEL
ALT: <5 U/L (ref 0–33)
AST: 17 U/L (ref 0–35)
Albumin: 4 g/dL (ref 3.9–4.9)
Alkaline Phosphatase: 62 U/L (ref 40–130)
Anion Gap: 16 mEq/L — ABNORMAL HIGH (ref 7–13)
BUN: 12 mg/dL (ref 6–20)
CO2: 18 mEq/L — ABNORMAL LOW (ref 22–29)
Calcium: 8.2 mg/dL — ABNORMAL LOW (ref 8.6–10.2)
Chloride: 107 mEq/L (ref 98–107)
Creatinine: 0.84 mg/dL (ref 0.50–0.90)
GFR African American: >60 (ref >60)
GFR Non-African American: >60 (ref >60)
Globulin: 2.4 g/dL (ref 2.3–3.5)
Glucose: 85 mg/dL (ref 74–109)
Potassium: 4.2 mEq/L (ref 3.5–5.1)
Sodium: 141 mEq/L (ref 132–144)
Total Bilirubin: 0.2 mg/dL (ref 0.0–1.2)
Total Protein: 6.4 g/dL (ref 6.4–8.1)

## 2014-02-06 LAB — LIPID PANEL
Cholesterol, Total: 189 mg/dL (ref 0–199)
HDL: 55 mg/dL (ref 40–59)
LDL Calculated: 115 mg/dL (ref 0–129)
Triglycerides: 93 mg/dL (ref 0–200)

## 2014-04-26 ENCOUNTER — Ambulatory Visit
Admit: 2014-04-26 | Discharge: 2014-04-26 | Payer: PRIVATE HEALTH INSURANCE | Attending: Urology | Primary: Family Medicine

## 2014-04-26 DIAGNOSIS — R319 Hematuria, unspecified: Secondary | ICD-10-CM

## 2014-04-26 LAB — POCT URINALYSIS DIPSTICK W/O MICROSCOPE (AUTO)
Glucose, UA POC: NEGATIVE
Nitrite, UA: NEGATIVE
Spec Grav, UA: 1.02
Urobilinogen, UA: 4
pH, UA: 7
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
## 2014-04-30 ENCOUNTER — Encounter: Admit: 2014-04-30 | Discharge: 2014-04-30 | Payer: PRIVATE HEALTH INSURANCE | Primary: Family Medicine

## 2014-05-03 ENCOUNTER — Encounter

## 2014-05-03 NOTE — Progress Notes (Signed)
Pre/post kub order needed for ct urogram. Order will be faxed to [facility].

## 2014-05-10 ENCOUNTER — Ambulatory Visit
Admit: 2014-05-10 | Discharge: 2014-05-10 | Payer: PRIVATE HEALTH INSURANCE | Attending: Urology | Primary: Family Medicine

## 2014-05-10 DIAGNOSIS — R319 Hematuria, unspecified: Secondary | ICD-10-CM
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
## 2014-05-17 LAB — HOMOCYSTEINE: Homocysteine: 9.7 umol/L (ref 0.0–15.0)

## 2014-05-25 ENCOUNTER — Encounter: Attending: Urology | Primary: Family Medicine

## 2014-05-25 MED FILL — MONOJECT FLUSH SYRINGE 0.9 % IV SOLN: 0.9 % | INTRAVENOUS | Qty: 10

## 2014-06-08 LAB — TSH, HIGH SENSITIVE: TSH: 0.448 u[IU]/mL (ref 0.270–4.200)

## 2014-06-08 LAB — CBC WITH DIFFERENTIAL
Basophils %: 0.5 %
Basophils Absolute: 0 10*3/uL (ref 0.0–0.2)
Eosinophils %: 2.7 %
Eosinophils Absolute: 0.1 10*3/uL (ref 0.0–0.7)
Hematocrit: 38.4 % (ref 37.0–47.0)
Hemoglobin: 13.2 g/dL (ref 12.0–16.0)
Lymphocytes %: 46.7 %
Lymphocytes Absolute: 1.9 10*3/uL (ref 1.0–4.8)
MCH: 32.6 pg — ABNORMAL HIGH (ref 27.0–31.3)
MCHC: 34.3 % (ref 33.0–37.0)
MCV: 95 fL (ref 82.0–100.0)
Monocytes %: 8.1 %
Monocytes Absolute: 0.3 10*3/uL (ref 0.2–0.8)
Neutrophils %: 42 %
Neutrophils Absolute: 1.7 10*3/uL (ref 1.4–6.5)
Platelets: 234 10*3/uL (ref 130–400)
RBC: 4.04 M/uL — ABNORMAL LOW (ref 4.20–5.40)
RDW: 14 % (ref 11.5–14.5)
WBC: 4.1 10*3/uL — ABNORMAL LOW (ref 4.8–10.8)

## 2014-06-08 LAB — COMPREHENSIVE METABOLIC PANEL
ALT: 9 U/L (ref 0–33)
AST: 19 U/L (ref 0–35)
Albumin: 4.2 g/dL (ref 3.9–4.9)
Alkaline Phosphatase: 49 U/L (ref 40–130)
Anion Gap: 10 mEq/L (ref 7–13)
BUN: 7 mg/dL (ref 6–20)
CO2: 20 mEq/L — ABNORMAL LOW (ref 22–29)
Calcium: 9 mg/dL (ref 8.6–10.2)
Chloride: 109 mEq/L — ABNORMAL HIGH (ref 98–107)
Creatinine: 0.7 mg/dL (ref 0.50–0.90)
GFR African American: >60 (ref >60)
GFR Non-African American: >60 (ref >60)
Globulin: 2.3 g/dL (ref 2.3–3.5)
Glucose: 90 mg/dL (ref 74–109)
Potassium: 3.8 mEq/L (ref 3.5–5.1)
Sodium: 139 mEq/L (ref 132–144)
Total Bilirubin: 0.4 mg/dL (ref 0.0–1.2)
Total Protein: 6.5 g/dL (ref 6.4–8.1)

## 2014-06-08 LAB — LIPID PANEL
Cholesterol, Total: 151 mg/dL (ref 0–199)
HDL: 66 mg/dL — ABNORMAL HIGH (ref 40–59)
LDL Calculated: 72 mg/dL (ref 0–129)
Triglycerides: 63 mg/dL (ref 0–200)

## 2014-11-09 ENCOUNTER — Inpatient Hospital Stay: Admit: 2014-11-09 | Discharge: 2014-11-09 | Disposition: A | Discharge status: Home / Self Care

## 2014-11-30 ENCOUNTER — Inpatient Hospital Stay
Admit: 2014-11-30 | Discharge: 2014-12-04 | Disposition: A | Discharge status: Home / Self Care | Payer: PRIVATE HEALTH INSURANCE | Source: Ambulatory Visit | Admitting: Psychiatry

## 2014-11-30 DIAGNOSIS — F3181 Bipolar II disorder: Principal | ICD-10-CM

## 2014-11-30 LAB — COMPREHENSIVE METABOLIC PANEL
ALT: 7 U/L (ref 0–33)
AST: 15 U/L (ref 0–35)
Albumin: 4.2 g/dL (ref 3.9–4.9)
Alkaline Phosphatase: 73 U/L (ref 40–130)
Anion Gap: 13 mEq/L (ref 7–13)
BUN: 13 mg/dL (ref 6–20)
CO2: 19 mEq/L — ABNORMAL LOW (ref 22–29)
Calcium: 8.9 mg/dL (ref 8.6–10.2)
Chloride: 105 mEq/L (ref 98–107)
Creatinine: 0.78 mg/dL (ref 0.50–0.90)
GFR African American: >60 (ref >60)
GFR Non-African American: >60 (ref >60)
Globulin: 2 g/dL — ABNORMAL LOW (ref 2.3–3.5)
Glucose: 128 mg/dL — ABNORMAL HIGH (ref 74–109)
Potassium: 3.3 mEq/L — ABNORMAL LOW (ref 3.5–5.1)
Sodium: 137 mEq/L (ref 132–144)
Total Bilirubin: 0.2 mg/dL (ref 0.0–1.2)
Total Protein: 6.2 g/dL — ABNORMAL LOW (ref 6.4–8.1)

## 2014-11-30 LAB — CBC WITH AUTO DIFFERENTIAL
Basophils %: 0.5 %
Basophils Absolute: 0 10*3/uL (ref 0.0–0.2)
Eosinophils %: 3 %
Eosinophils Absolute: 0.2 10*3/uL (ref 0.0–0.7)
Hematocrit: 35.7 % — ABNORMAL LOW (ref 37.0–47.0)
Hemoglobin: 12.2 g/dL (ref 12.0–16.0)
Lymphocytes %: 45.8 %
Lymphocytes Absolute: 2.3 10*3/uL (ref 1.0–4.8)
MCH: 31 pg (ref 27.0–31.3)
MCHC: 34.1 % (ref 33.0–37.0)
MCV: 91 fL (ref 82.0–100.0)
Monocytes %: 6.1 %
Monocytes Absolute: 0.3 10*3/uL (ref 0.2–0.8)
Neutrophils %: 44.6 %
Neutrophils Absolute: 2.2 10*3/uL (ref 1.4–6.5)
Platelets: 224 10*3/uL (ref 130–400)
RBC: 3.93 M/uL — ABNORMAL LOW (ref 4.20–5.40)
RDW: 15 % — ABNORMAL HIGH (ref 11.5–14.5)
WBC: 5 10*3/uL (ref 4.8–10.8)

## 2014-11-30 LAB — URINE DRUG SCREEN
Amphetamine Screen, Urine: NEGATIVE (ref <1000)
Barbiturate Screen, Ur: NEGATIVE (ref <200)
Benzodiazepine Screen, Urine: NEGATIVE (ref <200)
Cannabinoid Scrn, Ur: NEGATIVE (ref <50)
Cocaine Metabolite Screen, Urine: NEGATIVE (ref <300)
Opiate Scrn, Ur: NEGATIVE (ref <300)
PCP Screen, Urine: NEGATIVE (ref <25)

## 2014-11-30 LAB — PROTIME-INR
INR: 1.2
Protime: 13.2 s (ref 8.1–13.7)

## 2014-11-30 LAB — URINALYSIS WITH REFLEX TO CULTURE
Blood, Urine: NEGATIVE
Glucose, Ur: NEGATIVE mg/dL
Ketones, Urine: NEGATIVE mg/dL
Nitrite, Urine: NEGATIVE
Protein, UA: NEGATIVE mg/dL
Specific Gravity, UA: 1.027 (ref 1.005–1.030)
Urobilinogen, Urine: 1 E.U./dL (ref <2.0)
pH, UA: 6 (ref 5.0–9.0)

## 2014-11-30 LAB — URINALYSIS WITH MICROSCOPIC

## 2014-11-30 LAB — TSH: TSH: 0.875 u[IU]/mL (ref 0.270–4.200)

## 2014-11-30 LAB — ETHANOL: Ethanol Lvl: <10 mg/dL

## 2014-11-30 LAB — CK: Total CK: 73 U/L (ref 0–170)

## 2014-11-30 MED ORDER — NICOTINE POLACRILEX 2 MG MT GUM
2 MG | Freq: Once | OROMUCOSAL | Status: AC
Start: 2014-11-30 — End: 2014-11-30
  Administered 2014-11-30: 23:00:00 2 mg via ORAL

## 2014-11-30 MED FILL — NICORELIEF 2 MG MT GUM: 2 MG | OROMUCOSAL | Qty: 1

## 2014-11-30 NOTE — ED Notes (Signed)
Pt.requesting pain medication for lower back pain.  PA Theodoro Gristave notified.  Pain medication orders pending     Yancey FlemingsLaura A Valborg Friar, LPN  98/12/9108/14/16 [id number]

## 2014-11-30 NOTE — ED Notes (Signed)
Pt. Brought in via [facility]. Transferred to bed 4.  Pt. Is currently with registration.  Writer will obtain vitals and have patient change into hospital clothing and obtain urine sample.     Yancey FlemingsLaura A Roxie Kreeger, LPN  30/86/5710/14/16 [id number]

## 2014-11-30 NOTE — ED Notes (Signed)
Pt. Given nicorette gum p.o per order.  Bar code on bracet did not scan, and medication given did not have barcode.     Yancey FlemingsLaura A Goldman Birchall, LPN  16/11/9608/14/16 [id number]

## 2014-11-30 NOTE — ED Notes (Signed)
Pt. Vitals obtained.  Pt. Is resting in chair comfortably.  Dinner tray delivered.     Yancey FlemingsLaura A Collier Bohnet, LPN  04/54/0910/14/16 [phone]

## 2014-11-30 NOTE — Other (Signed)
The following meds were sent to pharmacy:  Diphenhydramine 25 mg, diclofnac 75 mg,  Gabapentin 300 mg (x2),  Latuda 40mg ,  Warfarin 5 mg,  Vit b12 1,06100mcg  Folic acid 1 mg,  Rosuvastatin 40mg ,  oxycoone 325mg ,  Vit b6 100mg ,  Nitrostat 0.4mg ,  Gabapentin 600mg   topmax 100mg   Omeprazole DR 20 mg  zanaflex 4mg   Aspirin 81 mg  Prenatal 100ct  metroprolol 25mg 
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
## 2014-11-30 NOTE — ED Notes (Signed)
[facility] called to [facility] Alberteen SpindleJohn RN.     Elmore GuiseJanet M Crystalyn Delia, RN  11/30/14 2151

## 2014-11-30 NOTE — ED Notes (Signed)
Pt. Is resting quietly in chair watching tv.  Denies any needs at this time.     Yancey FlemingsLaura A Nickol Collister, LPN  54/10/8108/14/16 [phone]

## 2014-11-30 NOTE — ED Notes (Signed)
Pt. Is at bedside talking quietly with another patient.  Denies needs at this time.  Awaiting transport to [facility].     Yancey FlemingsLaura A Raziya Aveni, LPN  16/11/9608/14/16 [phone]

## 2014-11-30 NOTE — ED Notes (Signed)
Lab @ bedside. Labs drawn & sent to lab. Tolerated lab draw well. Urine specimen sent to lab.     Elmore GuiseJanet M Lockie Bothun, RN  11/30/14 [phone]

## 2014-11-30 NOTE — ED Notes (Signed)
Pt. Is talking with PA/Student at bedside.  Denies any needs at this time.  Still awaiting order for nicorette gum for smoking cessation.     Yancey FlemingsLaura A Naoko Diperna, LPN  16/11/9608/14/16 [id number]

## 2014-12-01 MED ORDER — TOPIRAMATE 100 MG PO TABS
100 MG | Freq: Two times a day (BID) | ORAL | Status: DC
Start: 2014-12-01 — End: 2014-12-04
  Administered 2014-12-01 – 2014-12-04 (×7): 100 mg via ORAL

## 2014-12-01 MED ORDER — IBUPROFEN 800 MG PO TABS
800 MG | Freq: Three times a day (TID) | ORAL | Status: DC | PRN
Start: 2014-12-01 — End: 2014-12-04
  Administered 2014-12-01 – 2014-12-02 (×2): 800 mg via ORAL

## 2014-12-01 MED ORDER — FOLIC ACID 1 MG PO TABS
1 MG | Freq: Every day | ORAL | Status: DC
Start: 2014-12-01 — End: 2014-12-04
  Administered 2014-12-01 – 2014-12-04 (×4): 1 mg via ORAL

## 2014-12-01 MED ORDER — WARFARIN SODIUM 7.5 MG PO TABS
7.5 MG | ORAL | Status: DC
Start: 2014-12-01 — End: 2014-12-04
  Administered 2014-12-03: 22:00:00 7.5 mg via ORAL

## 2014-12-01 MED ORDER — LURASIDONE HCL 20 MG PO TABS
20 MG | Freq: Every evening | ORAL | Status: DC
Start: 2014-12-01 — End: 2014-12-03
  Administered 2014-12-02 – 2014-12-03 (×2): 60 mg via ORAL

## 2014-12-01 MED ORDER — ASPIRIN 81 MG PO CHEW
81 MG | Freq: Every day | ORAL | Status: DC
Start: 2014-12-01 — End: 2014-12-04
  Administered 2014-12-01 – 2014-12-04 (×4): 81 mg via ORAL

## 2014-12-01 MED ORDER — METOPROLOL TARTRATE 50 MG PO TABS
50 MG | Freq: Two times a day (BID) | ORAL | Status: DC
Start: 2014-12-01 — End: 2014-12-04
  Administered 2014-12-01 – 2014-12-04 (×7): 50 mg via ORAL

## 2014-12-01 MED ORDER — HYDROXYZINE PAMOATE 50 MG PO CAPS
50 MG | Freq: Four times a day (QID) | ORAL | Status: DC | PRN
Start: 2014-12-01 — End: 2014-12-04
  Administered 2014-12-01 – 2014-12-04 (×6): 50 mg via ORAL

## 2014-12-01 MED ORDER — MOMETASONE FURO-FORMOTEROL FUM 200-5 MCG/ACT IN AERO
200-5 MCG/ACT | Freq: Two times a day (BID) | RESPIRATORY_TRACT | Status: DC
Start: 2014-12-01 — End: 2014-12-04
  Administered 2014-12-01 – 2014-12-04 (×7): 1 via RESPIRATORY_TRACT

## 2014-12-01 MED ORDER — LURASIDONE HCL 40 MG PO TABS
40 MG | Freq: Every evening | ORAL | Status: DC
Start: 2014-12-01 — End: 2014-12-01

## 2014-12-01 MED ORDER — HYDROXYZINE HCL 50 MG/ML IM SOLN
50 MG/ML | Freq: Four times a day (QID) | INTRAMUSCULAR | Status: DC | PRN
Start: 2014-12-01 — End: 2014-12-04

## 2014-12-01 MED ORDER — MAGNESIUM HYDROXIDE 400 MG/5ML PO SUSP
400 MG/5ML | Freq: Every day | ORAL | Status: DC | PRN
Start: 2014-12-01 — End: 2014-12-04

## 2014-12-01 MED ORDER — ACETAMINOPHEN 325 MG PO TABS
325 MG | ORAL | Status: DC | PRN
Start: 2014-12-01 — End: 2014-12-04

## 2014-12-01 MED ORDER — PANTOPRAZOLE SODIUM 20 MG PO TBEC
20 MG | Freq: Every day | ORAL | Status: DC
Start: 2014-12-01 — End: 2014-12-04
  Administered 2014-12-01 – 2014-12-04 (×4): 20 mg via ORAL

## 2014-12-01 MED ORDER — IPRATROPIUM-ALBUTEROL 0.5-2.5 (3) MG/3ML IN SOLN
RESPIRATORY_TRACT | Status: DC | PRN
Start: 2014-12-01 — End: 2014-12-02

## 2014-12-01 MED ORDER — VITAMIN B-12 500 MCG PO TABS
500 MCG | Freq: Every day | ORAL | Status: DC
Start: 2014-12-01 — End: 2014-12-04
  Administered 2014-12-01 – 2014-12-04 (×4): 1000 ug via ORAL

## 2014-12-01 MED ORDER — ATORVASTATIN CALCIUM 40 MG PO TABS
40 MG | Freq: Every evening | ORAL | Status: DC
Start: 2014-12-01 — End: 2014-12-04
  Administered 2014-12-02 – 2014-12-04 (×3): 80 mg via ORAL

## 2014-12-01 MED ORDER — PYRIDOXINE HCL 50 MG PO TABS
50 MG | Freq: Every day | ORAL | Status: DC
Start: 2014-12-01 — End: 2014-12-04
  Administered 2014-12-01 – 2014-12-04 (×4): 100 mg via ORAL

## 2014-12-01 MED ORDER — NITROGLYCERIN 0.4 MG SL SUBL
0.4 MG | SUBLINGUAL | Status: DC | PRN
Start: 2014-12-01 — End: 2014-12-04

## 2014-12-01 MED ORDER — WARFARIN SODIUM 5 MG PO TABS
5 MG | ORAL | Status: DC
Start: 2014-12-01 — End: 2014-12-04
  Administered 2014-12-01 – 2014-12-02 (×2): 5 mg via ORAL

## 2014-12-01 MED ORDER — CITALOPRAM HYDROBROMIDE 20 MG PO TABS
20 MG | Freq: Every day | ORAL | Status: DC
Start: 2014-12-01 — End: 2014-12-02
  Administered 2014-12-01 – 2014-12-02 (×2): 20 mg via ORAL

## 2014-12-01 MED ORDER — DIPHENHYDRAMINE HCL 25 MG PO TABS
25 MG | Freq: Four times a day (QID) | ORAL | Status: DC | PRN
Start: 2014-12-01 — End: 2014-12-04

## 2014-12-01 MED ORDER — TIZANIDINE HCL 4 MG PO TABS
4 MG | Freq: Three times a day (TID) | ORAL | Status: DC | PRN
Start: 2014-12-01 — End: 2014-12-04
  Administered 2014-12-01 – 2014-12-03 (×6): 4 mg via ORAL

## 2014-12-01 MED ORDER — NICOTINE POLACRILEX 2 MG MT GUM
2 MG | OROMUCOSAL | Status: DC | PRN
Start: 2014-12-01 — End: 2014-12-04
  Administered 2014-12-01 – 2014-12-04 (×10): 2 mg via ORAL

## 2014-12-01 MED ORDER — OXYCODONE-ACETAMINOPHEN 5-325 MG PO TABS
5-325 MG | Freq: Three times a day (TID) | ORAL | Status: DC | PRN
Start: 2014-12-01 — End: 2014-12-04
  Administered 2014-12-01 – 2014-12-04 (×8): 1 via ORAL

## 2014-12-01 MED ORDER — GABAPENTIN 300 MG PO CAPS
300 MG | Freq: Three times a day (TID) | ORAL | Status: DC
Start: 2014-12-01 — End: 2014-12-04
  Administered 2014-12-01 – 2014-12-04 (×11): 600 mg via ORAL

## 2014-12-01 MED FILL — CHILDRENS ASPIRIN 81 MG PO CHEW: 81 MG | ORAL | Qty: 1

## 2014-12-01 MED FILL — GABAPENTIN 300 MG PO CAPS: 300 MG | ORAL | Qty: 2

## 2014-12-01 MED FILL — METOPROLOL TARTRATE 50 MG PO TABS: 50 MG | ORAL | Qty: 1

## 2014-12-01 MED FILL — IBUPROFEN 800 MG PO TABS: 800 MG | ORAL | Qty: 1

## 2014-12-01 MED FILL — TIZANIDINE HCL 4 MG PO TABS: 4 MG | ORAL | Qty: 1

## 2014-12-01 MED FILL — NICORELIEF 2 MG MT GUM: 2 MG | OROMUCOSAL | Qty: 1

## 2014-12-01 MED FILL — FOLIC ACID 1 MG PO TABS: 1 MG | ORAL | Qty: 1

## 2014-12-01 MED FILL — IPRATROPIUM-ALBUTEROL 0.5-2.5 (3) MG/3ML IN SOLN: RESPIRATORY_TRACT | Qty: 3

## 2014-12-01 MED FILL — CITALOPRAM HYDROBROMIDE 20 MG PO TABS: 20 MG | ORAL | Qty: 1

## 2014-12-01 MED FILL — HYDROXYZINE PAMOATE 50 MG PO CAPS: 50 MG | ORAL | Qty: 1

## 2014-12-01 MED FILL — COUMADIN 5 MG PO TABS: 5 MG | ORAL | Qty: 1

## 2014-12-01 MED FILL — PANTOPRAZOLE SODIUM 20 MG PO TBEC: 20 MG | ORAL | Qty: 1

## 2014-12-01 MED FILL — OXYCODONE-ACETAMINOPHEN 5-325 MG PO TABS: 5-325 MG | ORAL | Qty: 1

## 2014-12-01 MED FILL — TOPIRAMATE 100 MG PO TABS: 100 MG | ORAL | Qty: 1

## 2014-12-01 MED FILL — VITAMIN B-12 500 MCG PO TABS: 500 MCG | ORAL | Qty: 2

## 2014-12-01 MED FILL — DULERA 200-5 MCG/ACT IN AERO: 200-5 MCG/ACT | RESPIRATORY_TRACT | Qty: 88.64

## 2014-12-01 NOTE — Plan of Care (Signed)
Problem: Altered Mood, Depressive Behavior  Goal: LTG-Able to verbalize acceptance of life and situations over which he or she has no control  Outcome: Ongoing  Goal: LTG-Able to verbalize and/or display a decrease in depressive symptoms  Outcome: Ongoing  Goal: STG-Able to verbalize suicidal ideations  Outcome: Ongoing  Goal: STG-Able to verbalize support system  Outcome: Ongoing  Goal: LTG-Absence of self-harm  Outcome: Ongoing  Goal: STG-Knowledge of positive coping patterns  Outcome: Ongoing  Goal: Patient Specific Goal  Outcome: Ongoing  Goal: Participation in care planning  Outcome: Ongoing
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
## 2014-12-01 NOTE — Care Coordination-Inpatient (Signed)
Patient gets medications at [facility] This helps her take meds correctly Also has difficulty opening pill bottles
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
## 2014-12-01 NOTE — Progress Notes (Signed)
Current Order:duoneb q4prn  Home Regime duoneb q4prn  Ordering Physician:bolajiRe-evaluation Date:none    Diagnosis mental illnessPatient Status: Stable / Unstable + Physician notified    The following MDI Criteria must be met in order to convert aerosol to MDI with spacer. If unable to meet, MDI will be converted to aerosol:  []   Patient able to demonstrate the ability to use MDI effectively  []   Patient alert and cooperative  []   Patient able to take deep breath with 5-10 second hold  []   Medication(s) available in this delivery method   []   Peak flow greater than or equal to 200 ml/min            Current Order Substituted To  (same drug, same frequency)   Aerosol to MDI []  Albuterol Sulfate 0.083% unit dose by aerosol Albuterol Sulfate MDI 2 puffs by inhalation with spacer    []  Levalbuterol 1.25 mg unit dose by aerosol Levalbuterol MDI 2 puffs by inhalation with spacer    []  Levalbuterol 0.63 mg unit dose by aerosol Levalbuterol MDI 2 puffs by inhalation with spacer    []  Ipratropium Bromide 0.02% unit dose by aerosol Ipratropium Bromide MDI 2 puffs by inhalation with spacer    []  Duoneb (Ipratropium + Albuterol) unit dose by aerosol Ipratropium MDI + Albuterol MDI 2 puffs by inhalation w/spacer   MDI to Aerosol []  Albuterol Sulfate MDI Albuterol Sulfate 0.083% unit dose by aerosol    []  Levalbuterol MDI 2 puffs by inhalation Levalbuterol 1.25 mg unit dose by aerosol    []  Ipratropium Bromide MDI by inhalation Ipratropium Bromide 0.02% unit dose by aerosol    []  Combivent (Ipratropium + Albuterol) MDI by inhalation Duoneb (Ipratropium + Albuterol) unit dose by aerosol   Treatment Assessment [Frequency/Schedule]:  Change frequency to: ________no changes__________________________________________per Protocol, P&T, MEC      Points 0 1 2 3 4    Pulmonary Status  Non-Smoker  []    Smoking history   < 20 pack years  []    Smoking history  ? 20 pack years  []    Pulmonary Disorder  (acute or chronic)  [x]    Severe or  Chronic w/ Exacerbation  []      Surgical Status No [x]    Surgeries     General []    Surgery Lower []    Abdominal Thoracic or []    Upper Abdominal Thoracic with  PulmonaryDisorder  []      Chest X-ray Clear/Not  Ordered     [x]   Chronic Changes  Results Pending  []   Infiltrates, atelectasis, pleural effusion, or edema  []   Infiltrates in more than one lobe []   Infiltrate + Atelectasis, &/or pleural effusion  []     Respiratory Pattern Regular,  RR = 12-20 [x]   Increased,  RR = 21-25 []   DOE, irregular,  or RR = 26-30 []   Decreased FEV1  or RR = 31-35 []   Severe SOB, use  of accessory muscles, or RR ? 35  []     Mental Status Alert, oriented,  Cooperative [x]   Confused but Follows commands []   Lethargic or unable to follow commands []   Obtunded  []   Comatose  []     Breath Sounds Clear to  auscultation  [x]   Decreased unilaterally or  in bases only []   Decreased  bilaterally  []   Crackles or intermittent wheezes []   Wheezes []     Cough Strong, Spontan., & nonproductive [x]   Strong,  spontaneous, &  productive []   Weak,  Nonproductive []   Weak, productive or  with wheezes []   No spontaneous  cough or may require suctioning []     Level of Activity Ambulatory []   Ambulatory w/ Assist  []   Non-ambulatory []   Paraplegic []   Quadriplegic []     Total    Score:___3____     Triage Score:___5_____      Tri       Triage:     1. (>20) Freq: Q3    2. (16-20) Freq: Q4   3. (11-15) Freq: QID & Albuterol Q2 PRN    4. (6-10) Freq: TID & Albuterol Q2 PRN    5. (0-5) Freq Q4prn

## 2014-12-01 NOTE — Progress Notes (Signed)
Pt. Up on unit noted with rapid racing speech. Pt. Was irritable about what meds she was going to and worried she wouldn't get her coumedin.  Requested percocet for pain to her rt hip and back with good results.  Stated she wasn't on celexa at home and should of been and was agreeable to it this am.  Also had vistaril for noted racing thoughts that appeared to be helpful and pt appeared calmer less racing thoughts. Expressed paranoia thinks people are talking about her.  Denied suicidal thoughts.

## 2014-12-01 NOTE — Progress Notes (Signed)
Pt. Is visible on unit and calm and cooperative with staff.  Social on unit.  Eating well.  Patient di state she slept well last night.  States no needs at this time.

## 2014-12-01 NOTE — Progress Notes (Signed)
Group Therapy Note    Date:12/01/14  Start Time:1100  End Time:  1145    Number of Participants:5    Type of Group: Psychoeducation    Patient's Goal:  To participate in activities group.    Notes: Patient declined to attend psychoeducation group at1100 despite encouragement by staff.     Discipline Responsible: Social Worker/Counselor    Lucretia FieldAnita M Raahim Shartzer, LISW
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
## 2014-12-02 LAB — CULTURE, URINE: Urine Culture, Routine: >50000

## 2014-12-02 LAB — PROTIME-INR
INR: 1.2
Protime: 12.7 s (ref 8.1–13.7)

## 2014-12-02 MED ORDER — IPRATROPIUM-ALBUTEROL 20-100 MCG/ACT IN AERS
20-100 MCG/ACT | RESPIRATORY_TRACT | Status: DC | PRN
Start: 2014-12-02 — End: 2014-12-04
  Administered 2014-12-02 – 2014-12-04 (×5): 2 via RESPIRATORY_TRACT

## 2014-12-02 MED FILL — TIZANIDINE HCL 4 MG PO TABS: 4 MG | ORAL | Qty: 1

## 2014-12-02 MED FILL — HYDROXYZINE PAMOATE 50 MG PO CAPS: 50 MG | ORAL | Qty: 1

## 2014-12-02 MED FILL — IBUPROFEN 800 MG PO TABS: 800 MG | ORAL | Qty: 1

## 2014-12-02 MED FILL — GABAPENTIN 300 MG PO CAPS: 300 MG | ORAL | Qty: 2

## 2014-12-02 MED FILL — CITALOPRAM HYDROBROMIDE 20 MG PO TABS: 20 MG | ORAL | Qty: 1

## 2014-12-02 MED FILL — NICORELIEF 2 MG MT GUM: 2 MG | OROMUCOSAL | Qty: 1

## 2014-12-02 MED FILL — CHILDRENS ASPIRIN 81 MG PO CHEW: 81 MG | ORAL | Qty: 1

## 2014-12-02 MED FILL — METOPROLOL TARTRATE 50 MG PO TABS: 50 MG | ORAL | Qty: 1

## 2014-12-02 MED FILL — VITAMIN B-12 500 MCG PO TABS: 500 MCG | ORAL | Qty: 2

## 2014-12-02 MED FILL — TOPIRAMATE 100 MG PO TABS: 100 MG | ORAL | Qty: 1

## 2014-12-02 MED FILL — FOLIC ACID 1 MG PO TABS: 1 MG | ORAL | Qty: 1

## 2014-12-02 MED FILL — COUMADIN 5 MG PO TABS: 5 MG | ORAL | Qty: 1

## 2014-12-02 MED FILL — OXYCODONE-ACETAMINOPHEN 5-325 MG PO TABS: 5-325 MG | ORAL | Qty: 1

## 2014-12-02 MED FILL — ATORVASTATIN CALCIUM 40 MG PO TABS: 40 MG | ORAL | Qty: 2

## 2014-12-02 MED FILL — PANTOPRAZOLE SODIUM 20 MG PO TBEC: 20 MG | ORAL | Qty: 1

## 2014-12-02 MED FILL — LATUDA 20 MG PO TABS: 20 MG | ORAL | Qty: 1

## 2014-12-02 MED FILL — COMBIVENT RESPIMAT 20-100 MCG/ACT IN AERS: 20-100 MCG/ACT | RESPIRATORY_TRACT | Qty: 200

## 2014-12-02 MED FILL — VITAMIN B-6 50 MG PO TABS: 50 MG | ORAL | Qty: 2

## 2014-12-02 NOTE — Plan of Care (Signed)
Problem: Altered Mood, Depressive Behavior  Goal: LTG-Able to verbalize acceptance of life and situations over which he or she has no control  Outcome: Met This Shift  Goal: LTG-Able to verbalize and/or display a decrease in depressive symptoms  Outcome: Ongoing  Goal: STG-Able to verbalize suicidal ideations  Outcome: Met This Shift  Pt denies SI. Electronically signed by Quitman Livings, RN on 12/02/2014 at 2:46 AM  Goal: STG-Able to verbalize support system  Outcome: Met This Shift  Pt says she has a good support system, lists family, friends and the [place] [facility]. Electronically signed by Quitman Livings, RN on 12/02/2014 at 2:46 AM  Goal: LTG-Absence of self-harm  Outcome: Met This Shift  Goal: STG-Knowledge of positive coping patterns  Outcome: Ongoing  Pt able to list listening to music and talking to friends and family as positive coping skills. Electronically signed by Quitman Livings, RN on 12/02/2014 at 2:47 AM  Goal: Patient Specific Goal  Outcome: Ongoing  Goal: Participation in care planning  Outcome: Ongoing    Problem: Falls - Risk of  Goal: Absence of falls  Outcome: Met This Shift

## 2014-12-02 NOTE — Progress Notes (Signed)
Group Therapy Note    Date: 12/02/2014  Start Time: 1100  End Time: 1130    Number of Participants: 5    Type of Group: Psychoeducation      Patient's Goal:  To participate in activities group.    Notes:  Patient completed one task. Seemed to enjoy the session.    Status After Intervention:  Improved    Participation Level: Active Listener    Participation Quality: Appropriate    Speech:  normal    Thought Process/Content: Logical    Affective Functioning: Congruent    Mood: elevated    Level of consciousness:  Alert    Response to Learning: Able to verbalize current knowledge/experience    Endings: None Reported    Modes of Intervention: Education      Discipline Responsible: Social Worker/Counselor      Signature:  Lucretia FieldAnita M Nour Scalise, LISW

## 2014-12-02 NOTE — Progress Notes (Signed)
Pt. Denies all.  States she is sleeping better.  Pt. Intrusive and loud.  Demanding and entitled.  Pressured speech and flight of ideas

## 2014-12-02 NOTE — Progress Notes (Signed)
FAMILY COLLATERAL NOTE    Family/Support Name: Ms. Mechele CollinKimbro-Frost  Contact #: [phone]  Relationship to Pt: Patient's Aide      Placed call to above.      Response:  Child psychotherapistocial Worker confirmed and dialed phone number provided by patient. Phone rang with a recording stating that the subscriber was not accepting incoming calls.      Lucretia FieldAnita M Nature Kueker, LISW

## 2014-12-02 NOTE — Progress Notes (Signed)
Pt. Up on unit noted hyper verbal and anxious even after vistaril given this am. Also had zanaflex and motrin for back pain and rt hip pain. Pt states" I think its going to rain today because I am so achy"  Denies suicidal thoughts and voices reports anxiety a #8

## 2014-12-02 NOTE — Progress Notes (Signed)
Current Order: Duoneb Q4 PRN   Home Regimen: Duoneb Q4 PRN       Ordering Physician: Bolagi  Re-evaluation Date:       Diagnosis: Bipolar       Patient Status: Stable     The following MDI Criteria must be met in order to convert aerosol to MDI with spacer. If unable to meet, MDI will be converted to aerosol:  [x]  Patient able to demonstrate the ability to use MDI effectively  [x]  Patient alert and cooperative  [x]  Patient able to take deep breath with 5-10 second hold  [x]  Medication(s) available in this delivery method   [x]  Peak flow greater than or equal to 200 ml/min            Current Order Substituted To  (same drug, same frequency)   Aerosol to MDI [] Albuterol Sulfate 0.083% unit dose by aerosol Albuterol Sulfate MDI 2 puffs by inhalation with spacer    [] Levalbuterol 1.25 mg unit dose by aerosol Levalbuterol MDI 2 puffs by inhalation with spacer    [] Levalbuterol 0.63 mg unit dose by aerosol Levalbuterol MDI 2 puffs by inhalation with spacer    [] Ipratropium Bromide 0.02% unit dose by aerosol Ipratropium Bromide MDI 2 puffs by inhalation with spacer    [x] Duoneb (Ipratropium + Albuterol) unit dose by aerosol Ipratropium MDI + Albuterol MDI 2 puffs by inhalation w/spacer   MDI to Aerosol [] Albuterol Sulfate MDI Albuterol Sulfate 0.083% unit dose by aerosol    [] Levalbuterol MDI 2 puffs by inhalation Levalbuterol 1.25 mg unit dose by aerosol    [] Ipratropium Bromide MDI by inhalation Ipratropium Bromide 0.02% unit dose by aerosol    [] Combivent (Ipratropium + Albuterol) MDI by inhalation Duoneb (Ipratropium + Albuterol) unit dose by aerosol   Treatment Assessment [Frequency/Schedule]:  Change frequency to: ___________Combivent Q4 Prn _______per Protocol, P&T, MEC      Points 0 _0 Pulmonary Status  Non-Smoker  []   Smoking history   < 20 pack years  []   Smoking history  ? 20 pack years  []   Pulmonary Disorder  (acute or chronic)  [x]   Severe or Chronic w/ Exacerbation  []     Surgical  Status No [x]   Surgeries     General []   Surgery Lower []   Abdominal Thoracic or []   Upper Abdominal Thoracic with  PulmonaryDisorder  []     Chest X-ray Clear/Not  Ordered     [x]  Chronic Changes  Results Pending  []  Infiltrates, atelectasis, pleural effusion, or edema  []  Infiltrates in more than one lobe []  Infiltrate + Atelectasis, &/or pleural effusion  []    Respiratory Pattern Regular,  RR = 12-20 [x]  Increased,  RR = 21-25 []  DOE, irregular,  or RR = 26-30 []  Decreased FEV1  or RR = 31-35 []  Severe SOB, use  of accessory muscles, or RR ? 35  []    Mental Status Alert, oriented,  Cooperative [x]  Confused but Follows commands []  Lethargic or unable to follow commands []  Obtunded  []  Comatose  []    Breath Sounds Clear to  auscultation  [x]  Decreased unilaterally or  in bases only []  Decreased  bilaterally  []  Crackles or intermittent wheezes []  Wheezes []  Cough Strong, Spontan., & nonproductive [x]  Strong,  spontaneous, &  productive []  Weak,  Nonproductive []  Weak, productive or  with wheezes []  No spontaneous  cough or may require suctioning []    Level of Activity Ambulatory [x]  Ambulatory w/ Assist  []  Non-ambulatory []  Paraplegic []  Quadriplegic []    Total    Score:____3___     Triage Score:___5_____      Tri       Triage:     1. (>20) Freq: Q3    2. (16-20) Freq: Q4   3. (11-15) Freq: QID & Albuterol Q2 PRN    4. (6-10) Freq: TID & Albuterol Q2 PRN    5. (0-5) Freq Q4prn

## 2014-12-02 NOTE — Plan of Care (Signed)
Problem: Anger Management/Homicidal Ideation  Goal: STG-Able to express anger through appropriate verbalization and healthy physical outlets  Outcome: Ongoing  Goal: LTG-Absence of angry outbursts  Outcome: Ongoing    Problem: Falls - Risk of:  Goal: Will remain free from falls  Will remain free from falls  Outcome: Met This Shift  Goal: Absence of physical injury  Absence of physical injury  Outcome: Met This Shift

## 2014-12-02 NOTE — Progress Notes (Signed)
BEHAVIORAL HEALTH FOLLOW-UP NOTE     12/02/2014     Patient was seen and examined in person, Chart reviewed   Patient's case discussed with staff/team    Chief Complaint: Manic    Interim History:   Pt continue to remain manic, pressured  Loud and intrusive  Irritable, angry about her home meds not started  Paranoia on and off  No AVH  Racing thoughts  No grandiose delusions          Appetite:   Normal/Unchanged   Increased   Decreased      Sleep:        Normal/Unchanged   Fair        Poor              Energy:     Normal/Unchanged   Increased   Decreased        SI  Present   Absent    HI  Present   Absent     Aggression: verbal   yes   no    Patient is  able   unable to CONTRACT FOR SAFETY     Medication side effects(SE):   None(Psych. Meds.)  Other      PAST MEDICAL/PSYCHIATRIC HISTORY:   Past Medical History   Diagnosis Date   ??? CAD (coronary artery disease)    ??? Cardiac angina (HCC)    ??? CHF (congestive heart failure) (HCC)    ??? COPD (chronic obstructive pulmonary disease) (HCC)    ??? 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 GERD (gastroesophageal reflux disease)    ??? History of blood transfusion    ??? Hyperlipidemia    ??? Hypertension    ??? MI (myocardial infarction) (HCC)    ??? Neuromuscular disorder (HCC)    ??? Osteoarthritis    ??? Pneumonia    ??? Psychiatric problem    ??? Pulmonary embolism (HCC)    ??? Scoliosis        FAMILY/SOCIAL HISTORY:  Family History   Problem Relation Age of Onset   ??? Cirrhosis Mother    ??? Other Mother      Hepatitis B   ??? High Blood Pressure Father    ??? Cancer Maternal Grandmother    ??? Arthritis Maternal Grandmother    ??? Cancer Paternal Grandmother    ??? Scoliosis Paternal Grandmother    ??? High Blood Pressure Paternal Grandfather      Social History     Social History   ??? Marital status: Widowed     Spouse name: N/A   ??? Number of children: N/A   ??? Years of education: N/A     Occupational History   ??? Not on file.     Social History Main Topics   ???  Smoking status: Current Every Day Smoker     Packs/day: 1.50     Years: 30.00   ??? Smokeless tobacco: Not on file   ??? Alcohol use No   ??? Drug use: No   ??? Sexual activity: Not Currently     Other Topics Concern   ??? Not on file     Social History Narrative           ROS:   All negative/unchanged except if checked. Explain positive(checked items) below:   Constitutional   Eyes   Ear/Nose/Mouth/Throat   Respiratory   CV   GI   GU   Musculoskeletal   Skin/Breast   Neurological    Endocrine  []  Heme/Lymph  []  Allergic/Immunologic    Explanation:     MEDICATIONS:    Current Facility-Administered Medications:   ???  ibuprofen (ADVIL;MOTRIN) tablet 800 mg, 800 mg, Oral, Q8H PRN, Tanda Rockers, MD, 800 mg at 12/02/14 0912  ???  aspirin chewable tablet 81 mg, 81 mg, Oral, Daily, Tanda Rockers, MD, 81 mg at 12/02/14 0912  ???  citalopram (CELEXA) tablet 20 mg, 20 mg, Oral, Daily, Tanda Rockers, MD, 20 mg at 12/02/14 0913  ???  diphenhydrAMINE (BENADRYL) tablet 25 mg, 25 mg, Oral, Q6H PRN, Tanda Rockers, MD  ???  folic acid (FOLVITE) tablet 1 mg, 1 mg, Oral, Daily, Tanda Rockers, MD, 1 mg at 12/02/14 0913  ???  gabapentin (NEURONTIN) capsule 600 mg, 600 mg, Oral, TID, Tanda Rockers, MD, 600 mg at 12/02/14 0911  ???  ipratropium-albuterol (DUONEB) nebulizer solution 1 ampule, 1 ampule, Inhalation, Q4H PRN, Tanda Rockers, MD  ???  metoprolol tartrate (LOPRESSOR) tablet 50 mg, 50 mg, Oral, BID, Tanda Rockers, MD, 50 mg at 12/01/14 2059  ???  nitroGLYCERIN (NITROSTAT) SL tablet 0.4 mg, 0.4 mg, Sublingual, Q5 Min PRN, Tanda Rockers, MD  ???  pantoprazole (PROTONIX) tablet 20 mg, 20 mg, Oral, Daily, Tanda Rockers, MD, 20 mg at 12/02/14 0620  ???  oxyCODONE-acetaminophen (PERCOCET) 5-325 MG per tablet 1 tablet, 1 tablet, Oral, TID PRN, Tanda Rockers, MD, 1 tablet at 12/01/14 1807  ???  pyridoxine (B-6) tablet 100 mg, 100 mg, Oral, Daily, Tanda Rockers, MD, 100 mg at 12/02/14 0912  ???  tiZANidine  (ZANAFLEX) tablet 4 mg, 4 mg, Oral, TID PRN, Tanda Rockers, MD, 4 mg at 12/02/14 0911  ???  topiramate (TOPAMAX) tablet 100 mg, 100 mg, Oral, BID, Tanda Rockers, MD, 100 mg at 12/01/14 2059  ???  vitamin B-12 (CYANOCOBALAMIN) tablet 1,000 mcg, 1,000 mcg, Oral, Daily, Tanda Rockers, MD, 1,000 mcg at 12/02/14 0912  ???  warfarin (COUMADIN) tablet 5 mg, 5 mg, Oral, Once per day on Sun Tue Wed Thu Sat, Tanda Rockers, MD, 5 mg at 12/01/14 1817  ???  mometasone-formoterol (DULERA) 200-5 MCG/ACT inhaler 1 puff, 1 puff, Inhalation, BID, Tanda Rockers, MD, 1 puff at 12/02/14 0814  ???  [START ON 12/03/2014] warfarin (COUMADIN) tablet 7.5 mg, 7.5 mg, Oral, Once per day on Mon Fri, Ekundyo E Bolaji, MD  ???  atorvastatin (LIPITOR) tablet 80 mg, 80 mg, Oral, Nightly, Tanda Rockers, MD, 80 mg at 12/01/14 2059  ???  lurasidone (LATUDA) tablet 60 mg, 60 mg, Oral, Nightly, Gillermina Hu, MD, 60 mg at 12/01/14 2059  ???  acetaminophen (TYLENOL) tablet 650 mg, 650 mg, Oral, Q4H PRN, Desma Paganini, MD  ???  magnesium hydroxide (MILK OF MAGNESIA) 400 MG/5ML suspension 30 mL, 30 mL, Oral, Daily PRN, Desma Paganini, MD  ???  nicotine polacrilex (NICORETTE) gum 2 mg, 2 mg, Oral, PRN, Desma Paganini, MD, 2 mg at 12/02/14 0912  ???  hydrOXYzine (VISTARIL) capsule 50 mg, 50 mg, Oral, Q6H PRN, Desma Paganini, MD, 50 mg at 12/02/14 0911  ???  hydrOXYzine (VISTARIL) injection 50 mg, 50 mg, Intramuscular, Q6H PRN, Desma Paganini, MD      Examination:  Visit Vitals   ??? BP 104/73   ??? Pulse 67   ??? Temp 98 ??F (36.7 ??C) (Oral)   ??? Resp 18   ??? Ht 5\' 4"  (1.626 m)   ??? Wt 170 lb (77.1 kg)   ???  LMP Comment: pt states "3-5 years ago"   ??? SpO2 99%   ??? Breastfeeding No   ??? BMI 29.18 kg/m2     Gait - steady    Mental Status Examination:    Level of consciousness:  within normal limits   Appearance:  hospital attire  Behavior/Motor:  psychomotor agitation  Attitude toward examiner:  argumentative and hostile  Speech:  hyperverbal, pressured and interrupting    Mood: irritable and labile  Affect:  intense and angry  Thought processes:  flight of ideas, tangential and circumstantial   Thought content:  Preoccupied with her home meds  Delusions:  ideas of reference  Perceptual Disturbance:  denies any perceptual disturbance  Cognition:  oriented to person, place, and time   Concentration distractible  Insight poor   Judgement poor     ASSESSMENT:  Patient symptoms are:  []  Well controlled  []  Improving  []  Worsening  []  No change      Diagnosis:  Principal Problem:    Bipolar 2 disorder, major depressive episode (HCC)      LABS:    Recent Labs      11/30/14   1800   WBC  5.0   HGB  12.2   PLT  224     Recent Labs      11/30/14   1800   NA  137   K  3.3*   CL  105   CO2  19*   BUN  13   CREATININE  0.78   GLUCOSE  128*     Recent Labs      11/30/14   1800   BILITOT  0.2   ALKPHOS  73   AST  15   ALT  7     Lab Results   Component Value Date    LABAMPH Neg 11/30/2014    BARBSCNU Neg 11/30/2014    LABBENZ Neg 11/30/2014    LABBENZ NotDTCD 11/27/2010    OPIATESCREENURINE Neg 11/30/2014    PHENCYCLIDINESCREENURINE Neg 11/30/2014    ETOH <10 11/30/2014     Lab Results   Component Value Date    TSH 0.875 11/30/2014     No results found for: LITHIUM  No results found for: VALPROATE, CBMZ      Treatment Plan:  Reviewed current Medications with the patient. Consult hospitalist for coreg prescription. D/C Celexa  Risks, benefits, side effects, drug-to-drug interactions and alternatives to treatment were discussed.  Collateral information: pending  Encourage patient to attend group and other milieu activities.  Discharge planning discussed with the patient and treatment team.    PSYCHOTHERAPY/COUNSELING:  []  Therapeutic interview  []  Supportive  []  CBT  []  Ongoing  []  Other      Anticipated Length of stay:  3 days      Electronically signed by Gillermina HuBALAJI Sabryna Lahm, MD on 12/02/2014 at 10:10 AM
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
## 2014-12-02 NOTE — Progress Notes (Signed)
Group Therapy Note    Date: 12/02/2014  Start Time: 0900  End Time:  0915    Number of Participants:11    Type of Group: Psychoeducation    Patient's Goal:  To participate in morning meeting.    Notes:  Patient participated in discussion and exercise.    Status After Intervention:  Improved    Participation Level: Active Listener    Participation Quality: Appropriate    Speech:  normal    Thought Process/Content: Logical    Affective Functioning: Congruent    Mood: elevated    Level of consciousness:  Alert    Response to Learning: Able to verbalize current knowledge/experience    Endings: None Reported    Modes of Intervention: Education      Discipline Responsible: Social Worker/Counselor      Signature:  Lucretia FieldAnita M Reyne Falconi, LISW

## 2014-12-03 LAB — PROTIME-INR
INR: 1.3
Protime: 13.8 s — ABNORMAL HIGH (ref 8.1–13.7)

## 2014-12-03 MED ORDER — SIMETHICONE 80 MG PO CHEW
80 MG | Freq: Four times a day (QID) | ORAL | Status: DC | PRN
Start: 2014-12-03 — End: 2014-12-04
  Administered 2014-12-03 – 2014-12-04 (×2): 80 mg via ORAL

## 2014-12-03 MED ORDER — LURASIDONE HCL 40 MG PO TABS
40 MG | Freq: Every evening | ORAL | Status: DC
Start: 2014-12-03 — End: 2014-12-04
  Administered 2014-12-04: 80 mg via ORAL

## 2014-12-03 MED ORDER — ENOXAPARIN SODIUM 40 MG/0.4ML SC SOLN
40 MG/0.4ML | Freq: Every day | SUBCUTANEOUS | Status: DC
Start: 2014-12-03 — End: 2014-12-04
  Administered 2014-12-03 – 2014-12-04 (×2): 40 mg via SUBCUTANEOUS

## 2014-12-03 MED ORDER — DOCUSATE SODIUM 100 MG PO CAPS
100 MG | Freq: Two times a day (BID) | ORAL | Status: DC
Start: 2014-12-03 — End: 2014-12-04
  Administered 2014-12-03 – 2014-12-04 (×2): 100 mg via ORAL

## 2014-12-03 MED FILL — TOPIRAMATE 100 MG PO TABS: 100 MG | ORAL | Qty: 1

## 2014-12-03 MED FILL — ENOXAPARIN SODIUM 40 MG/0.4ML SC SOLN: 40 MG/0.4ML | SUBCUTANEOUS | Qty: 0.4

## 2014-12-03 MED FILL — SIMETHICONE 80 MG PO CHEW: 80 MG | ORAL | Qty: 1

## 2014-12-03 MED FILL — CHILDRENS ASPIRIN 81 MG PO CHEW: 81 MG | ORAL | Qty: 1

## 2014-12-03 MED FILL — ATORVASTATIN CALCIUM 40 MG PO TABS: 40 MG | ORAL | Qty: 2

## 2014-12-03 MED FILL — VITAMIN B-6 50 MG PO TABS: 50 MG | ORAL | Qty: 2

## 2014-12-03 MED FILL — LATUDA 20 MG PO TABS: 20 MG | ORAL | Qty: 1

## 2014-12-03 MED FILL — GABAPENTIN 300 MG PO CAPS: 300 MG | ORAL | Qty: 2

## 2014-12-03 MED FILL — TIZANIDINE HCL 4 MG PO TABS: 4 MG | ORAL | Qty: 1

## 2014-12-03 MED FILL — OXYCODONE-ACETAMINOPHEN 5-325 MG PO TABS: 5-325 MG | ORAL | Qty: 1

## 2014-12-03 MED FILL — VITAMIN B-12 500 MCG PO TABS: 500 MCG | ORAL | Qty: 2

## 2014-12-03 MED FILL — DOCUSATE SODIUM 100 MG PO CAPS: 100 MG | ORAL | Qty: 1

## 2014-12-03 MED FILL — FOLIC ACID 1 MG PO TABS: 1 MG | ORAL | Qty: 1

## 2014-12-03 MED FILL — NICORELIEF 2 MG MT GUM: 2 MG | OROMUCOSAL | Qty: 1

## 2014-12-03 MED FILL — METOPROLOL TARTRATE 50 MG PO TABS: 50 MG | ORAL | Qty: 1

## 2014-12-03 MED FILL — HYDROXYZINE PAMOATE 50 MG PO CAPS: 50 MG | ORAL | Qty: 1

## 2014-12-03 MED FILL — PANTOPRAZOLE SODIUM 20 MG PO TBEC: 20 MG | ORAL | Qty: 1

## 2014-12-03 MED FILL — COUMADIN 7.5 MG PO TABS: 7.5 MG | ORAL | Qty: 1

## 2014-12-03 NOTE — Care Coordination-Inpatient (Signed)
Pt denies SI, HI and A/V hallucinations. Pt elevated in mood, pressured speech and flight of ideas. Pt preoccupied with her son. Reports that she wants her son to come back home. Pt reports a safety plan and a desire [facility]. Pt compliant with medications. Pt reported that she had depression in the morning but that it has greatly decreased. Pt visible on the unit and social with peers. Electronically signed by Crecencio McElizabeth K Tanecia Mccay, RN on 12/03/2014 at 5:42 PM

## 2014-12-03 NOTE — Behavioral Health Treatment Team (Signed)
Group Therapy Note    Date: 12/03/2014  Start Time: 1000  End Time:  1045  Number of Participants: 6    Type of Group: Psychoeducation    Wellness Binder Information  Module Name:    Session Number:      Patient's Goal:  "To go home."    Notes:  Calm and participated fairly.    Status After Intervention:  Improved    Participation Level: Active Listener    Participation Quality: Appropriate and Attentive      Speech:  normal      Thought Process/Content: Logical      Affective Functioning: Congruent      Mood: calm      Level of consciousness:  Alert      Response to Learning: Able to retain information      Endings: None Reported    Modes of Intervention: Education, Socialization and Activity      Discipline Responsible: Psychoeducational Specialist      Signature:  Tressie EllisMaria Malini Flemings

## 2014-12-03 NOTE — Progress Notes (Signed)
1421 med for c/o shoulder, hip and back pain.
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
## 2014-12-03 NOTE — Progress Notes (Signed)
Group Therapy Note    Date: 12/03/2014  Start Time: 1445  End Time: 1515    Number of Participants:4    Type of Group: Psychoeducation    Patient's Goal: To participate in mood management group.    Notes:  Patient learned to challenge her emotional mind.    Status After Intervention:  Improved    Participation Level: Active Listener    Participation Quality: Appropriate    Speech:  normal    Thought Process/Content: Logical    Affective Functioning: Congruent    Mood: elevated    Level of consciousness:  Alert    Response to Learning: Able to verbalize current knowledge/experience    Endings: None Reported    Modes of Intervention: Education      Discipline Responsible: Social Worker/Counselor      Signature:  Lucretia FieldAnita M Sesar Madewell, LISW

## 2014-12-03 NOTE — Progress Notes (Addendum)
BEHAVIORAL HEALTH FOLLOW-UP NOTE     12/03/2014     Patient was seen and examined in person, Chart reviewed   Patient's case discussed with staff/team    Chief Complaint: manic    Interim History:   Pt continue to improve with her manic symptoms  Pt is less intrusive behavior, racing thoughts.   Less pressured speech, irritability and impulsive thoughts.  Grandiose delusions or ideas - denies  Hallucinations no  Agitation behavior no      Appetite:  [x]  Normal/Unchanged  []  Increased  []  Decreased      Sleep:       []  Normal/Unchanged  [x]  Fair       []  Poor              Energy:    [x]  Normal/Unchanged  []  Increased  []  Decreased        SI []  Present  [x]  Absent    HI  [] Present  [x]  Absent     Aggression:  []  yes  [x]  no    Patient is [x]  able  []  unable to CONTRACT FOR SAFETY     PAST MEDICAL/PSYCHIATRIC HISTORY:   Past Medical History   Diagnosis Date   ??? CAD (coronary artery disease)    ??? Cardiac angina (HCC)    ??? CHF (congestive heart failure) (HCC)    ??? COPD (chronic obstructive pulmonary disease) (HCC)    ??? 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 GERD (gastroesophageal reflux disease)    ??? History of blood transfusion    ??? Hyperlipidemia    ??? Hypertension    ??? MI (myocardial infarction) (HCC)    ??? Neuromuscular disorder (HCC)    ??? Osteoarthritis    ??? Pneumonia    ??? Psychiatric problem    ??? Pulmonary embolism (HCC)    ??? Scoliosis        FAMILY/SOCIAL HISTORY:  Family History   Problem Relation Age of Onset   ??? Cirrhosis Mother    ??? Other Mother      Hepatitis B   ??? High Blood Pressure Father    ??? Cancer Maternal Grandmother    ??? Arthritis Maternal Grandmother    ??? Cancer Paternal Grandmother    ??? Scoliosis Paternal Grandmother    ??? High Blood Pressure Paternal Grandfather      Social History     Social History   ??? Marital status: Widowed     Spouse name: N/A   ??? Number of children: N/A   ??? Years of education: N/A     Occupational History   ??? Not on file.     Social History Main Topics   ??? Smoking status:  Current Every Day Smoker     Packs/day: 1.50     Years: 30.00   ??? Smokeless tobacco: Not on file   ??? Alcohol use No   ??? Drug use: No   ??? Sexual activity: Not Currently     Other Topics Concern   ??? Not on file     Social History Narrative           ROS:  [x]  All negative/unchanged except if checked. Explain positive(checked items) below:  []  Constitutional  []  Eyes  []  Ear/Nose/Mouth/Throat  []  Respiratory  []  CV  []  GI  []  GU  []  Musculoskeletal  []  Skin/Breast  []  Neurological  []  Endocrine  []  Heme/Lymph  []  Allergic/Immunologic    Explanation:  MEDICATIONS:    Current Facility-Administered Medications:   ???  albuterol-ipratropium (COMBIVENT RESPIMAT) 20-100 MCG/ACT inhaler 2 puff, 2 puff, Inhalation, Q4H PRN, Clayborne ArtistJohn Michael Dobritch, PA, 2 puff at 12/03/14 [id number]  ???  ibuprofen (ADVIL;MOTRIN) tablet 800 mg, 800 mg, Oral, Q8H PRN, Tanda RockersEkundyo E Bolaji, MD, 800 mg at 12/02/14 0912  ???  aspirin chewable tablet 81 mg, 81 mg, Oral, Daily, Tanda RockersEkundyo E Bolaji, MD, 81 mg at 12/02/14 0912  ???  diphenhydrAMINE (BENADRYL) tablet 25 mg, 25 mg, Oral, Q6H PRN, Tanda RockersEkundyo E Bolaji, MD  ???  folic acid (FOLVITE) tablet 1 mg, 1 mg, Oral, Daily, Tanda RockersEkundyo E Bolaji, MD, 1 mg at 12/02/14 0913  ???  gabapentin (NEURONTIN) capsule 600 mg, 600 mg, Oral, TID, Tanda RockersEkundyo E Bolaji, MD, 600 mg at 12/02/14 2016  ???  metoprolol tartrate (LOPRESSOR) tablet 50 mg, 50 mg, Oral, BID, Tanda RockersEkundyo E Bolaji, MD, 50 mg at 12/02/14 2017  ???  nitroGLYCERIN (NITROSTAT) SL tablet 0.4 mg, 0.4 mg, Sublingual, Q5 Min PRN, Tanda RockersEkundyo E Bolaji, MD  ???  pantoprazole (PROTONIX) tablet 20 mg, 20 mg, Oral, Daily, Tanda RockersEkundyo E Bolaji, MD, 20 mg at 12/03/14 [id number]  ???  oxyCODONE-acetaminophen (PERCOCET) 5-325 MG per tablet 1 tablet, 1 tablet, Oral, TID PRN, Tanda RockersEkundyo E Bolaji, MD, 1 tablet at 12/03/14 [id number]  ???  pyridoxine (B-6) tablet 100 mg, 100 mg, Oral, Daily, Tanda RockersEkundyo E Bolaji, MD, 100 mg at 12/02/14 0912  ???  tiZANidine (ZANAFLEX) tablet 4 mg, 4 mg, Oral, TID PRN, Tanda RockersEkundyo E Bolaji, MD, 4 mg at 12/03/14  0641  ???  topiramate (TOPAMAX) tablet 100 mg, 100 mg, Oral, BID, Tanda RockersEkundyo E Bolaji, MD, 100 mg at 12/02/14 2016  ???  vitamin B-12 (CYANOCOBALAMIN) tablet 1,000 mcg, 1,000 mcg, Oral, Daily, Tanda RockersEkundyo E Bolaji, MD, 1,000 mcg at 12/02/14 0912  ???  warfarin (COUMADIN) tablet 5 mg, 5 mg, Oral, Once per day on Sun Tue Wed Thu Sat, Tanda RockersEkundyo E Bolaji, MD, 5 mg at 12/02/14 1804  ???  mometasone-formoterol (DULERA) 200-5 MCG/ACT inhaler 1 puff, 1 puff, Inhalation, BID, Tanda RockersEkundyo E Bolaji, MD, 1 puff at 12/03/14 0710  ???  warfarin (COUMADIN) tablet 7.5 mg, 7.5 mg, Oral, Once per day on Mon Fri, Ekundyo E Bolaji, MD  ???  atorvastatin (LIPITOR) tablet 80 mg, 80 mg, Oral, Nightly, Tanda RockersEkundyo E Bolaji, MD, 80 mg at 12/02/14 2016  ???  lurasidone (LATUDA) tablet 60 mg, 60 mg, Oral, Nightly, Gillermina HuBalaji Leata Dominy, MD, 60 mg at 12/02/14 2016  ???  acetaminophen (TYLENOL) tablet 650 mg, 650 mg, Oral, Q4H PRN, Desma Paganinianiel Ionescu, MD  ???  magnesium hydroxide (MILK OF MAGNESIA) 400 MG/5ML suspension 30 mL, 30 mL, Oral, Daily PRN, Desma Paganinianiel Ionescu, MD  ???  nicotine polacrilex (NICORETTE) gum 2 mg, 2 mg, Oral, PRN, Desma Paganinianiel Ionescu, MD, 2 mg at 12/03/14 0544  ???  hydrOXYzine (VISTARIL) capsule 50 mg, 50 mg, Oral, Q6H PRN, Desma Paganinianiel Ionescu, MD, 50 mg at 12/02/14 2020  ???  hydrOXYzine (VISTARIL) injection 50 mg, 50 mg, Intramuscular, Q6H PRN, Desma Paganinianiel Ionescu, MD      Examination:  Visit Vitals   ??? BP 101/69   ??? Pulse 65   ??? Temp 98 ??F (36.7 ??C) (Oral)   ??? Resp 16   ??? Ht 5\' 4"  (1.626 m)   ??? Wt 170 lb (77.1 kg)   ??? LMP Comment: pt states "3-5 years ago"   ??? SpO2 98%   ??? Breastfeeding No   ??? BMI 29.18 kg/m2     Gait -  steady  Medication side effects(SE): no    Mental Status Examination:    Level of consciousness:  within normal limits   Appearance:  well-appearing  Behavior/Motor:  no abnormalities noted  Attitude toward examiner:  cooperative  Speech:  normal rate   Mood: anxious  Affect:  anxious  Thought processes:  coherent   Thought content:  Homocidal ideation no  Suicidal  Ideation:  denies suicidal ideation  Delusions:  no evidence of delusions  Perceptual Disturbance:  denies any perceptual disturbance  Cognition:  oriented to person, place, and time   Concentration intact  Insight fair   Judgement fair     ASSESSMENT:  Patient symptoms are:   Well controlled   Improving   Worsening   No change      Diagnosis:  Principal Problem:    Bipolar 2 disorder, major depressive episode (HCC)      LABS:    Recent Labs      11/30/14   1800   WBC  5.0   HGB  12.2   PLT  224     Recent Labs      11/30/14   1800   NA  137   K  3.3*   CL  105   CO2  19*   BUN  13   CREATININE  0.78   GLUCOSE  128*     Recent Labs      11/30/14   1800   BILITOT  0.2   ALKPHOS  73   AST  15   ALT  7     Lab Results   Component Value Date    LABAMPH Neg 11/30/2014    BARBSCNU Neg 11/30/2014    LABBENZ Neg 11/30/2014    LABBENZ NotDTCD 11/27/2010    OPIATESCREENURINE Neg 11/30/2014    PHENCYCLIDINESCREENURINE Neg 11/30/2014    ETOH <10 11/30/2014     Lab Results   Component Value Date    TSH 0.875 11/30/2014     No results found for: LITHIUM  No results found for: VALPROATE, CBMZ      Treatment Plan:  Reviewed current Medications with the patient.  Increase Latuda to 80 mg tomorrow  Risks, benefits, side effects, drug-to-drug interactions and alternatives to treatment were discussed.  Collateral information: reviewed  Encourage patient to attend group and other milieu activities.  Discharge planning discussed with the patient and treatment team.    PSYCHOTHERAPY/COUNSELING:   Therapeutic interview   Supportive   CBT   Ongoing   Other   Patient was seen 1:1 for 20 minutes, other than E&M time spent, focusing on      - coping skills techniques     - Anxiety management techniques discussed including deep breathing exercise and PMR     - discussing patients strength and weakness        Anticipated Length of stay: tomorrow if stable      Electronically signed by Gillermina Hu, MD on 12/03/2014 at  8:03 AM
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
## 2014-12-03 NOTE — Plan of Care (Signed)
Problem: Altered Mood, Depressive Behavior  Goal: STG-Able to verbalize suicidal ideations  Outcome: Ongoing  Pt denies SI at this time. Electronically signed by Bluford Kaufmann, RN on 12/03/2014 at 5:43 PM      Goal: STG-Able to verbalize support system  Outcome: Ongoing  Pt reports positive support system from her family. Electronically signed by Bluford Kaufmann, RN on 12/03/2014 at 5:44 PM      Goal: LTG-Absence of self-harm  Outcome: Ongoing  Pt denies SI or thoughts to harm self. Electronically signed by Bluford Kaufmann, RN on 12/03/2014 at 5:46 PM     Goal: STG-Knowledge of positive coping patterns  Outcome: Met This Shift  Pt reports prayer as a coping mechanism. Electronically signed by Bluford Kaufmann, RN on 12/03/2014 at 5:47 PM
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
## 2014-12-03 NOTE — Plan of Care (Signed)
Problem: Altered Mood, Depressive Behavior  Goal: LTG-Able to verbalize acceptance of life and situations over which he or she has no control  Outcome: Ongoing  Goal: LTG-Able to verbalize and/or display a decrease in depressive symptoms  Outcome: Ongoing  Goal: STG-Able to verbalize suicidal ideations  Outcome: Ongoing  Goal: STG-Able to verbalize support system  Outcome: Ongoing  Goal: LTG-Absence of self-harm  Outcome: Ongoing  Goal: STG-Knowledge of positive coping patterns  Outcome: Ongoing  Goal: Patient Specific Goal  Outcome: Ongoing  Goal: Participation in care planning  Outcome: Ongoing    Problem: Falls - Risk of  Goal: Absence of falls  Outcome: Ongoing    Problem: Anger Management/Homicidal Ideation  Goal: STG-Able to express anger through appropriate verbalization and healthy physical outlets  Outcome: Ongoing  Goal: LTG-Absence of angry outbursts  Outcome: Ongoing    Problem: Falls - Risk of:  Goal: Will remain free from falls  Will remain free from falls   Outcome: Ongoing  Goal: Absence of physical injury  Absence of physical injury   Outcome: Ongoing

## 2014-12-03 NOTE — Progress Notes (Signed)
Patient is social on unit and cooperative with staff.  Denies HI/SI.  Looking fowrard to discharge.  Patient is enrolling in classes upon discharge.  Cooperative with staff and medication compliant.  Denies any further needs at this time.

## 2014-12-03 NOTE — Behavioral Health Treatment Team (Signed)
Group Therapy Note    Date: 12/03/2014  Start Time: 1330  End Time:  1415  Number of Participants: 6    Type of Group: Healthy Living/Wellness    Wellness Binder Information  Module Name:  Depression  Session Number:        Notes:  Patient was interactive in group discussion with peers, able to relate to the material, and able to identify stressors in daily living.    Status After Intervention:  Improved    Participation Level: Active Listener and Interactive    Participation Quality: Appropriate, Attentive and Sharing      Speech:  normal      Thought Process/Content: Logical      Affective Functioning: Congruent      Mood: elevated      Level of consciousness:  Alert, Oriented x4 and Attentive      Response to Learning: Able to verbalize current knowledge/experience, Able to verbalize/acknowledge new learning and Able to retain information      Endings: None Reported    Modes of Intervention: Education      Discipline Responsible: MicrobiologistBehavorial Health Tech      Signature:  Forestine NaLaura Nicole Hadia Minier  Electronically signed by Forestine NaLaura Nicole Anton Cheramie on 12/03/2014 at 8:17 PM

## 2014-12-03 NOTE — Progress Notes (Signed)
Patient attended the 0900 community meeting. Electronically signed by Tressie EllisMaria Stefannie Defeo on 12/03/2014 at 9:39 AM

## 2014-12-04 LAB — PROTIME-INR
INR: 1.5
Protime: 16.1 s — ABNORMAL HIGH (ref 8.1–13.7)

## 2014-12-04 LAB — POTASSIUM: Potassium: 4 mEq/L (ref 3.5–5.1)

## 2014-12-04 MED ORDER — ENOXAPARIN SODIUM 80 MG/0.8ML SC SOLN
80 MG/0.8ML | INJECTION | Freq: Two times a day (BID) | SUBCUTANEOUS | 0 refills | Status: DC
Start: 2014-12-04 — End: 2015-01-30

## 2014-12-04 MED ORDER — HYDROXYZINE PAMOATE 50 MG PO CAPS
50 MG | ORAL_CAPSULE | Freq: Four times a day (QID) | ORAL | 1 refills | Status: AC | PRN
Start: 2014-12-04 — End: 2014-12-18

## 2014-12-04 MED ORDER — ENOXAPARIN SODIUM 80 MG/0.8ML SC SOLN
80 MG/0.8ML | Freq: Two times a day (BID) | SUBCUTANEOUS | Status: DC
Start: 2014-12-04 — End: 2014-12-04

## 2014-12-04 MED ORDER — LURASIDONE HCL 80 MG PO TABS
80 MG | ORAL_TABLET | Freq: Every evening | ORAL | 1 refills | Status: DC
Start: 2014-12-04 — End: 2015-01-30

## 2014-12-04 MED ORDER — ATORVASTATIN CALCIUM 80 MG PO TABS
80 MG | ORAL_TABLET | Freq: Every evening | ORAL | 3 refills | Status: AC
Start: 2014-12-04 — End: ?

## 2014-12-04 MED FILL — SIMETHICONE 80 MG PO CHEW: 80 MG | ORAL | Qty: 1

## 2014-12-04 MED FILL — TIZANIDINE HCL 4 MG PO TABS: 4 MG | ORAL | Qty: 1

## 2014-12-04 MED FILL — ENOXAPARIN SODIUM 40 MG/0.4ML SC SOLN: 40 MG/0.4ML | SUBCUTANEOUS | Qty: 0.4

## 2014-12-04 MED FILL — NICORELIEF 2 MG MT GUM: 2 MG | OROMUCOSAL | Qty: 1

## 2014-12-04 MED FILL — GABAPENTIN 300 MG PO CAPS: 300 MG | ORAL | Qty: 2

## 2014-12-04 MED FILL — DOCUSATE SODIUM 100 MG PO CAPS: 100 MG | ORAL | Qty: 1

## 2014-12-04 MED FILL — PANTOPRAZOLE SODIUM 20 MG PO TBEC: 20 MG | ORAL | Qty: 1

## 2014-12-04 MED FILL — TOPIRAMATE 100 MG PO TABS: 100 MG | ORAL | Qty: 1

## 2014-12-04 MED FILL — LATUDA 40 MG PO TABS: 40 MG | ORAL | Qty: 2

## 2014-12-04 MED FILL — ATORVASTATIN CALCIUM 40 MG PO TABS: 40 MG | ORAL | Qty: 2

## 2014-12-04 MED FILL — METOPROLOL TARTRATE 50 MG PO TABS: 50 MG | ORAL | Qty: 1

## 2014-12-04 MED FILL — OXYCODONE-ACETAMINOPHEN 5-325 MG PO TABS: 5-325 MG | ORAL | Qty: 1

## 2014-12-04 MED FILL — IBUPROFEN 800 MG PO TABS: 800 MG | ORAL | Qty: 1

## 2014-12-04 MED FILL — VITAMIN B-12 500 MCG PO TABS: 500 MCG | ORAL | Qty: 2

## 2014-12-04 MED FILL — VITAMIN B-6 50 MG PO TABS: 50 MG | ORAL | Qty: 2

## 2014-12-04 MED FILL — HYDROXYZINE PAMOATE 50 MG PO CAPS: 50 MG | ORAL | Qty: 1

## 2014-12-04 MED FILL — CHILDRENS ASPIRIN 81 MG PO CHEW: 81 MG | ORAL | Qty: 1

## 2014-12-04 MED FILL — FOLIC ACID 1 MG PO TABS: 1 MG | ORAL | Qty: 1

## 2014-12-04 NOTE — Plan of Care (Signed)
Problem: Falls - Risk of  Goal: Absence of falls  Outcome: Completed Date Met:  12/04/14

## 2014-12-04 NOTE — Behavioral Health Treatment Team (Signed)
Group Therapy Note    Date: 12/04/2014  Start Time: 1000  End Time:  1045  Number of Participants: 5    Type of Group: Psychoeducation    Wellness Binder Information  Module Name:    Session Number:      Patient's Goal:  "To be happy all day."    Notes:  Worked adequately on her task.    Status After Intervention:  Improved    Participation Level: Active Listener    Participation Quality: Appropriate and Attentive      Speech:  normal      Thought Process/Content: Logical      Affective Functioning: Congruent      Mood: Calmer when working on her task.      Level of consciousness:  Alert and Oriented x4      Response to Learning: Able to retain information      Endings: None Reported    Modes of Intervention: Education, Socialization and Activity      Discipline Responsible: Psychoeducational Specialist      Signature:  Tressie EllisMaria Jared Cahn

## 2014-12-04 NOTE — Discharge Instructions (Signed)
As tolerated

## 2014-12-04 NOTE — Progress Notes (Signed)
Pt. Up on unit brighter today. Admistered lovenox well w/o question or help. Pt. States she was trained by a Engineer, civil (consulting)nurse.  Pt denies suicidal thoughts , voices and depression.  Pt. Denied anx and offer for vistaril, motrin and zanaflex stated she didn't want to be so sleepy.

## 2014-12-04 NOTE — Plan of Care (Signed)
Problem: Falls - Risk of:  Goal: Will remain free from falls  Will remain free from falls   Outcome: Completed Date Met:  12/04/14  Goal: Absence of physical injury  Absence of physical injury   Outcome: Completed Date Met:  12/04/14

## 2014-12-04 NOTE — Progress Notes (Signed)
BEHAVIORAL HEALTH FOLLOW-UP NOTE     12/04/2014     Patient was seen and examined in person, Chart reviewed   Patient's case discussed with staff/team    Chief Complaint: Manic    Interim History:     Patient has been feeling better. Significant progress in the symptoms since admission.  Mood better, with the score of 10/10  No AVH or paranoid thoughts  noHopeless or worthless feeling  No active SI/HI        Appetite:  [x]  Normal  []  Increased  []  Decreased      Sleep:       [x]  Normal  []  Fair       []  Poor              Energy:    [x]  Normal  []  Increased  []  Decreased        SI []  Present  [x]  Absent    HI  [] Present  [x]  Absent     Aggression:  []  yes  []  no    Patient is [x]  able  []  unable to CONTRACT FOR SAFETY     Medication side effects(SE):  [x]  None(Psych. Meds.) []  Other      PAST MEDICAL/PSYCHIATRIC HISTORY:   Past Medical History   Diagnosis Date   ??? CAD (coronary artery disease)    ??? Cardiac angina (HCC)    ??? CHF (congestive heart failure) (HCC)    ??? COPD (chronic obstructive pulmonary disease) (HCC)    ??? 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 GERD (gastroesophageal reflux disease)    ??? History of blood transfusion    ??? Hyperlipidemia    ??? Hypertension    ??? MI (myocardial infarction) (HCC)    ??? Neuromuscular disorder (HCC)    ??? Osteoarthritis    ??? Pneumonia    ??? Psychiatric problem    ??? Pulmonary embolism (HCC)    ??? Scoliosis        FAMILY/SOCIAL HISTORY:  Family History   Problem Relation Age of Onset   ??? Cirrhosis Mother    ??? Other Mother      Hepatitis B   ??? High Blood Pressure Father    ??? Cancer Maternal Grandmother    ??? Arthritis Maternal Grandmother    ??? Cancer Paternal Grandmother    ??? Scoliosis Paternal Grandmother    ??? High Blood Pressure Paternal Grandfather      Social History     Social History   ??? Marital status: Widowed     Spouse name: N/A   ??? Number of children: N/A   ??? Years of education: N/A     Occupational History   ??? Not on file.     Social History Main Topics   ??? Smoking  status: Current Every Day Smoker     Packs/day: 1.50     Years: 30.00   ??? Smokeless tobacco: Not on file   ??? Alcohol use No   ??? Drug use: No   ??? Sexual activity: Not Currently     Other Topics Concern   ??? Not on file     Social History Narrative           ROS:  [x]  All negative/unchanged except if checked. Explain positive(checked items) below:  []  Constitutional  []  Eyes  []  Ear/Nose/Mouth/Throat  []  Respiratory  []  CV  []  GI  []  GU  []  Musculoskeletal  []  Skin/Breast  []  Neurological  []   Endocrine   Heme/Lymph   Allergic/Immunologic    Explanation:     MEDICATIONS:    Current Facility-Administered Medications:   ???  lurasidone (LATUDA) tablet 80 mg, 80 mg, Oral, Nightly, Gillermina Hu, MD, 80 mg at 12/03/14 2022  ???  simethicone (MYLICON) chewable tablet 80 mg, 80 mg, Oral, Q6H PRN, Philipp Ovens, PA-C, 80 mg at 12/03/14 1421  ???  docusate sodium (COLACE) capsule 100 mg, 100 mg, Oral, BID, Tiffany T Brown, PA-C, 100 mg at 12/03/14 2021  ???  enoxaparin (LOVENOX) injection 40 mg, 40 mg, Subcutaneous, Daily, Tiffany T Brown, PA-C, 40 mg at 12/03/14 1818  ???  albuterol-ipratropium (COMBIVENT RESPIMAT) 20-100 MCG/ACT inhaler 2 puff, 2 puff, Inhalation, Q4H PRN, Clayborne Artist Dobritch, PA, 2 puff at 12/03/14 2020  ???  ibuprofen (ADVIL;MOTRIN) tablet 800 mg, 800 mg, Oral, Q8H PRN, Tanda Rockers, MD, 800 mg at 12/02/14 0912  ???  aspirin chewable tablet 81 mg, 81 mg, Oral, Daily, Tanda Rockers, MD, 81 mg at 12/03/14 1011  ???  diphenhydrAMINE (BENADRYL) tablet 25 mg, 25 mg, Oral, Q6H PRN, Tanda Rockers, MD  ???  folic acid (FOLVITE) tablet 1 mg, 1 mg, Oral, Daily, Tanda Rockers, MD, 1 mg at 12/03/14 1011  ???  gabapentin (NEURONTIN) capsule 600 mg, 600 mg, Oral, TID, Tanda Rockers, MD, 600 mg at 12/03/14 2022  ???  metoprolol tartrate (LOPRESSOR) tablet 50 mg, 50 mg, Oral, BID, Tanda Rockers, MD, 50 mg at 12/03/14 2021  ???  nitroGLYCERIN (NITROSTAT) SL tablet 0.4 mg, 0.4 mg, Sublingual, Q5 Min PRN, Tanda Rockers, MD  ???  pantoprazole (PROTONIX) tablet 20 mg, 20 mg, Oral, Daily, Tanda Rockers, MD, 20 mg at 12/04/14 0701  ???  oxyCODONE-acetaminophen (PERCOCET) 5-325 MG per tablet 1 tablet, 1 tablet, Oral, TID PRN, Tanda Rockers, MD, 1 tablet at 12/04/14 8657  ???  pyridoxine (B-6) tablet 100 mg, 100 mg, Oral, Daily, Tanda Rockers, MD, 100 mg at 12/03/14 1011  ???  tiZANidine (ZANAFLEX) tablet 4 mg, 4 mg, Oral, TID PRN, Tanda Rockers, MD, 4 mg at 12/03/14 1824  ???  topiramate (TOPAMAX) tablet 100 mg, 100 mg, Oral, BID, Tanda Rockers, MD, 100 mg at 12/03/14 2022  ???  vitamin B-12 (CYANOCOBALAMIN) tablet 1,000 mcg, 1,000 mcg, Oral, Daily, Tanda Rockers, MD, 1,000 mcg at 12/03/14 1011  ???  warfarin (COUMADIN) tablet 5 mg, 5 mg, Oral, Once per day on Sun Tue Wed Thu Sat, Tanda Rockers, MD, 5 mg at 12/02/14 1804  ???  mometasone-formoterol (DULERA) 200-5 MCG/ACT inhaler 1 puff, 1 puff, Inhalation, BID, Tanda Rockers, MD, 1 puff at 12/03/14 2020  ???  warfarin (COUMADIN) tablet 7.5 mg, 7.5 mg, Oral, Once per day on Mon Fri, Ekundyo E Bolaji, MD, 7.5 mg at 12/03/14 1824  ???  atorvastatin (LIPITOR) tablet 80 mg, 80 mg, Oral, Nightly, Tanda Rockers, MD, 80 mg at 12/03/14 2021  ???  acetaminophen (TYLENOL) tablet 650 mg, 650 mg, Oral, Q4H PRN, Desma Paganini, MD  ???  magnesium hydroxide (MILK OF MAGNESIA) 400 MG/5ML suspension 30 mL, 30 mL, Oral, Daily PRN, Desma Paganini, MD  ???  nicotine polacrilex (NICORETTE) gum 2 mg, 2 mg, Oral, PRN, Desma Paganini, MD, 2 mg at 12/04/14 8469  ???  hydrOXYzine (VISTARIL) capsule 50 mg, 50 mg, Oral, Q6H PRN, Desma Paganini, MD, 50 mg at 12/03/14 2021  ???  hydrOXYzine (VISTARIL) injection 50 mg, 50 mg, Intramuscular,  Q6H PRN, Desma Paganini, MD      Examination:  Visit Vitals   ??? BP 99/68   ??? Pulse 59   ??? Temp 97 ??F (36.1 ??C) (Oral)   ??? Resp 18   ??? Ht  (1.626 m)   ??? Wt 170 lb (77.1 kg)   ??? LMP Comment: pt states "3-5 years ago"   ??? SpO2 96%   ??? Breastfeeding No   ??? BMI 29.18 kg/m2      Gait - steady    Mental Status Examination:    Level of consciousness:  within normal limits   Appearance:  well-appearing  Behavior/Motor:  no abnormalities noted  Attitude toward examiner:  attentive and good eye contact  Speech:  spontaneous, normal rate and normal volume   Mood: euthymic  Affect:  mood congruent  Thought processes:  linear, goal directed and coherent   Thought content:  Homocidal ideation denies  Suicidal Ideation:  denies suicidal ideation  Delusions:  no evidence of delusions  Perceptual Disturbance:  denies any perceptual disturbance  Cognition:  oriented to person, place, and time   Concentration intact  Memory intact  Insight good   Judgement fair   Fund of Knowledge adequate      ASSESSMENT:  Patient symptoms are:   Well controlled   Improving   Worsening   No change      Diagnosis:  Principal Problem:    Bipolar 2 disorder, major depressive episode (HCC)      LABS:    No results for input(s): WBC, HGB, PLT in the last 72 hours.  Recent Labs      12/03/14   2245   K  4.0     No results for input(s): BILITOT, ALKPHOS, AST, ALT in the last 72 hours.  Lab Results   Component Value Date    LABAMPH Neg 11/30/2014    BARBSCNU Neg 11/30/2014    LABBENZ Neg 11/30/2014    LABBENZ NotDTCD 11/27/2010    OPIATESCREENURINE Neg 11/30/2014    PHENCYCLIDINESCREENURINE Neg 11/30/2014    ETOH <10 11/30/2014     Lab Results   Component Value Date    TSH 0.875 11/30/2014     No results found for: LITHIUM  No results found for: VALPROATE, CBMZ    RISK ASSESSMENT: Low risk for suicide and homicide.     Treatment Plan:  Reviewed current Medications with the patient. Education provided on the complaince with treatment.  Risks, benefits, side effects, drug-to-drug interactions and alternatives to treatment were discussed.  Encourage patient to attend outpatient follow up appointment and therapy.  Discharge planning discussed with the patient and treatment team.      PSYCHOTHERAPY/COUNSELING:    Therapeutic interview   Supportive   CBT   Ongoing   Other      Anticipated Length of stay:Patient will be discharged home today       Electronically signed by Gillermina Hu, MD on 12/04/2014 at 9:03 AM

## 2014-12-04 NOTE — Progress Notes (Signed)
Pt. attended the 0900 community meeting. Electronically signed by Mliss SaxPatricia A Nathanuel Cabreja, [facility] on 12/04/2014 at 9:58 AM

## 2014-12-04 NOTE — Progress Notes (Signed)
Patient denies suicidal/homicidal ideation- reviewed and verbalized understanding of all discharge instructions, follow up and meds-----patient able to state understanding of lab work needed tomorrow at coumadin clinic and her lovenox- states she will [facility] with dr Lars [facility]ju[facility] about her coumadin doses.  D/c with care giver for transport home- with home meds and belongings

## 2014-12-04 NOTE — Plan of Care (Signed)
Problem: Anger Management/Homicidal Ideation  Goal: STG-Able to express anger through appropriate verbalization and healthy physical outlets  Outcome: Completed Date Met:  12/04/14  Goal: LTG-Absence of angry outbursts  Outcome: Completed Date Met:  12/04/14
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
## 2014-12-04 NOTE — Discharge Instructions (Signed)
Good nutrition is important when healing from an illness, injury, or surgery. Follow any nutrition recommendations given to you during the hospital stay. If you are instructed to take an oral nutrition supplement at home, you can take it with meals, in-between meals, and/or before bedtime. These supplements can be purchased at most local grocery stores, pharmacies, and chain super-stores. If you need help in covering the cost of your medication or oral supplement, visit www.RxAssist.org. If you have any questions about your diet or nutrition, call the hospital and ask for the dietitian.       Your nutrition orders at the time of discharge were:  DIET GENERAL;.

## 2014-12-04 NOTE — Progress Notes (Signed)
Group Therapy Note    Date: 12/04/2014  Start Time: 1100  End Time:  1145  Number of Participants: 6    Type of Group: Psychotherapy    Patient's Goal:  To go home    Notes:  Pt shared looking forward to discharge and [facility]  Smiling and pleasant.    Status After Intervention:  Improved    Participation Level: Interactive    Participation Quality: Appropriate, Attentive, Sharing and Supportive      Speech:  normal      Thought Process/Content: Logical  Linear      Affective Functioning: Congruent      Mood: euthymic      Level of consciousness:  Alert, Oriented x4 and Attentive      Response to Learning: Able to verbalize current knowledge/experience, Able to retain information and Able to change behavior      Endings: None Reported    Modes of Intervention: Exploration and Clarifying      Discipline Responsible: Social Worker/Counselor      Signature:  Perfecto KingdomNadine M Darwyn Ponzo, [facility]

## 2014-12-04 NOTE — Plan of Care (Signed)
Problem: Altered Mood, Depressive Behavior  Goal: LTG-Able to verbalize acceptance of life and situations over which he or she has no control  Outcome: Completed Date Met:  12/04/14  Goal: LTG-Able to verbalize and/or display a decrease in depressive symptoms  Outcome: Completed Date Met:  12/04/14  Goal: STG-Able to verbalize suicidal ideations  Outcome: Completed Date Met:  12/04/14  Goal: STG-Able to verbalize support system  Outcome: Completed Date Met:  12/04/14  Goal: LTG-Absence of self-harm  Outcome: Completed Date Met:  12/04/14  Goal: STG-Knowledge of positive coping patterns  Outcome: Completed Date Met:  12/04/14  Goal: Patient Specific Goal  Outcome: Completed Date Met:  12/04/14  Goal: Participation in care planning  Outcome: Completed Date Met:  12/04/14

## 2014-12-05 ENCOUNTER — Inpatient Hospital Stay: Payer: PRIVATE HEALTH INSURANCE | Primary: Family Medicine

## 2014-12-24 NOTE — Telephone Encounter (Signed)
Spoke w/ patient, rec'd referral -- sched for 1st appt 12-26-14 . Patient says she has been on warfarin approx 10 years (5mg  5x/week and 7.5mg  2-day per week)

## 2015-01-07 ENCOUNTER — Ambulatory Visit: Payer: PRIVATE HEALTH INSURANCE | Primary: Family Medicine

## 2015-01-22 ENCOUNTER — Ambulatory Visit: Payer: PRIVATE HEALTH INSURANCE | Primary: Family Medicine

## 2015-01-23 ENCOUNTER — Inpatient Hospital Stay
Admit: 2015-01-23 | Discharge: 2015-01-30 | Disposition: A | Discharge status: Home / Self Care | Payer: PRIVATE HEALTH INSURANCE | Admitting: Psychiatry

## 2015-01-23 DIAGNOSIS — F3181 Bipolar II disorder: Principal | ICD-10-CM

## 2015-01-23 LAB — PROTIME-INR
INR: 3.2
Protime: 35.5 s — ABNORMAL HIGH (ref 8.1–13.7)

## 2015-01-23 LAB — URINALYSIS WITH REFLEX TO CULTURE
Blood, Urine: NEGATIVE
Glucose, Ur: NEGATIVE mg/dL
Ketones, Urine: NEGATIVE mg/dL
Nitrite, Urine: NEGATIVE
Protein, UA: NEGATIVE mg/dL
Specific Gravity, UA: 1.015 (ref 1.005–1.030)
Urobilinogen, Urine: 1 E.U./dL (ref <2.0)
pH, UA: 6 (ref 5.0–9.0)

## 2015-01-23 LAB — CBC WITH AUTO DIFFERENTIAL
Basophils %: 0.5 %
Basophils Absolute: 0 10*3/uL (ref 0.0–0.2)
Eosinophils %: 1.1 %
Eosinophils Absolute: 0.1 10*3/uL (ref 0.0–0.7)
Hematocrit: 41.9 % (ref 37.0–47.0)
Hemoglobin: 13.5 g/dL (ref 12.0–16.0)
Lymphocytes %: 23.1 %
Lymphocytes Absolute: 1.6 10*3/uL (ref 1.0–4.8)
MCH: 30.7 pg (ref 27.0–31.3)
MCHC: 32.3 % — ABNORMAL LOW (ref 33.0–37.0)
MCV: 94.8 fL (ref 82.0–100.0)
Monocytes %: 10.3 %
Monocytes Absolute: 0.7 10*3/uL (ref 0.2–0.8)
Neutrophils %: 65 %
Neutrophils Absolute: 4.6 10*3/uL (ref 1.4–6.5)
Platelets: 309 10*3/uL (ref 130–400)
RBC: 4.42 M/uL (ref 4.20–5.40)
RDW: 16 % — ABNORMAL HIGH (ref 11.5–14.5)
WBC: 7 10*3/uL (ref 4.8–10.8)

## 2015-01-23 LAB — URINE DRUG SCREEN
Amphetamine Screen, Urine: NEGATIVE (ref <1000)
Barbiturate Screen, Ur: NEGATIVE (ref <200)
Benzodiazepine Screen, Urine: NEGATIVE (ref <200)
Cannabinoid Scrn, Ur: NEGATIVE (ref <50)
Cocaine Metabolite Screen, Urine: POSITIVE — AB (ref <300)
Opiate Scrn, Ur: NEGATIVE (ref <300)
PCP Screen, Urine: NEGATIVE (ref <25)

## 2015-01-23 LAB — ETHANOL: Ethanol Lvl: <10 mg/dL

## 2015-01-23 LAB — LITHIUM LEVEL: Lithium Lvl: 0.2 mEq/L — ABNORMAL LOW (ref 0.6–1.2)

## 2015-01-23 LAB — COMPREHENSIVE METABOLIC PANEL
ALT: 11 U/L (ref 0–33)
AST: 34 U/L (ref 0–35)
Albumin: 4.5 g/dL (ref 3.9–4.9)
Alkaline Phosphatase: 80 U/L (ref 40–130)
Anion Gap: 13 mEq/L (ref 7–13)
BUN: 6 mg/dL (ref 6–20)
CO2: 19 mEq/L — ABNORMAL LOW (ref 22–29)
Calcium: 9.3 mg/dL (ref 8.6–10.2)
Chloride: 104 mEq/L (ref 98–107)
Creatinine: 1.02 mg/dL — ABNORMAL HIGH (ref 0.50–0.90)
GFR African American: >60 (ref >60)
GFR Non-African American: 57.4 — ABNORMAL LOW (ref >60)
Globulin: 2.8 g/dL (ref 2.3–3.5)
Glucose: 109 mg/dL (ref 74–109)
Potassium: 4 mEq/L (ref 3.5–5.1)
Sodium: 136 mEq/L (ref 132–144)
Total Bilirubin: 0.7 mg/dL (ref 0.0–1.2)
Total Protein: 7.3 g/dL (ref 6.4–8.1)

## 2015-01-23 LAB — MICROSCOPIC URINALYSIS

## 2015-01-23 LAB — APTT: aPTT: 37.4 s — ABNORMAL HIGH (ref 21.6–35.4)

## 2015-01-23 LAB — HCG, SERUM, QUALITATIVE: hCG Qual: NEGATIVE

## 2015-01-23 LAB — CK-MB INDEX
CK-MB Index: 0.9 % (ref 0.0–3.5)
CK-MB: 8.5 ng/mL — ABNORMAL HIGH (ref 0.0–3.8)
Total CK: 963 U/L — ABNORMAL HIGH (ref 0–170)

## 2015-01-23 LAB — TSH: TSH: 1.07 u[IU]/mL (ref 0.270–4.200)

## 2015-01-23 MED ORDER — PANTOPRAZOLE SODIUM 40 MG PO TBEC
40 MG | Freq: Every day | ORAL | Status: DC
Start: 2015-01-23 — End: 2015-01-30
  Administered 2015-01-24 – 2015-01-30 (×8): 40 mg via ORAL

## 2015-01-23 MED ORDER — IPRATROPIUM-ALBUTEROL 0.5-2.5 (3) MG/3ML IN SOLN
RESPIRATORY_TRACT | Status: DC
Start: 2015-01-23 — End: 2015-01-24
  Administered 2015-01-23 – 2015-01-24 (×2): 1 via RESPIRATORY_TRACT

## 2015-01-23 MED FILL — IPRATROPIUM-ALBUTEROL 0.5-2.5 (3) MG/3ML IN SOLN: RESPIRATORY_TRACT | Qty: 3

## 2015-01-23 NOTE — ED Notes (Signed)
Lab notified of need for lab draw.     Elmore GuiseJanet M Stephannie Broner, RN  01/23/15 [phone]

## 2015-01-23 NOTE — ED Triage Notes (Signed)
[facility] & [facility].

## 2015-01-23 NOTE — ED Notes (Signed)
Denies need to void. PO fluids encouraged.     Elmore GuiseJanet M Dania Marsan, RN  01/23/15 [phone]
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
## 2015-01-23 NOTE — Progress Notes (Signed)
Report called by Henrene PastorJanet RN, BAC. Pt is 49y/o female. VOLUNTARY admit of Dr. Harvel QualeMomen with diagnosis of Bipolar 2. Pt reports she was having trouble with her sons and was going to "taker herself out of the equation" and see if they missed her. Pt stated she was leaving howm to live with mother, mother states pt is NOT coming to her house. Sons report pt has ample income yet was pawning household items. Sons also report mom has hx of going "on crack cocaine" Pt tested + for cocaine at admission. Sons report they believe pt is having a nervous breakdown.     Pt placed on verified home meds with some adjustment. Pt was irritable and argumentative with [facility] assessor who pink sheeted at interview at home, [facility] Pt has had respiratory treatment at ER and will be followed by RT on unit.     Med history = COPD  Surgical History = unstated  Pysch History =Bipolar 2, previous 3wt hosp

## 2015-01-23 NOTE — ED Notes (Signed)
Lab here. Labs drawn. Tolerated lab draw well.     Elmore GuiseJanet M Nayzeth Altman, RN  01/23/15 [phone]
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
## 2015-01-24 LAB — LITHIUM LEVEL: Lithium Lvl: 0.2 mEq/L — ABNORMAL LOW (ref 0.6–1.2)

## 2015-01-24 MED ORDER — HYDROXYZINE HCL 50 MG/ML IM SOLN
50 MG/ML | Freq: Four times a day (QID) | INTRAMUSCULAR | Status: DC | PRN
Start: 2015-01-24 — End: 2015-01-30

## 2015-01-24 MED ORDER — ALBUTEROL SULFATE HFA 108 (90 BASE) MCG/ACT IN AERS
108 (90 Base) MCG/ACT | RESPIRATORY_TRACT | Status: DC | PRN
Start: 2015-01-24 — End: 2015-01-27

## 2015-01-24 MED ORDER — TRAMADOL HCL 50 MG PO TABS
50 MG | Freq: Four times a day (QID) | ORAL | Status: DC | PRN
Start: 2015-01-24 — End: 2015-01-30
  Administered 2015-01-25 – 2015-01-30 (×15): 50 mg via ORAL

## 2015-01-24 MED ORDER — ACETAMINOPHEN 325 MG PO TABS
325 MG | ORAL | Status: DC | PRN
Start: 2015-01-24 — End: 2015-01-30
  Administered 2015-01-26: 21:00:00 650 mg via ORAL

## 2015-01-24 MED ORDER — IPRATROPIUM-ALBUTEROL 20-100 MCG/ACT IN AERS
20-100 MCG/ACT | Freq: Four times a day (QID) | RESPIRATORY_TRACT | Status: DC
Start: 2015-01-24 — End: 2015-01-24

## 2015-01-24 MED ORDER — NICOTINE POLACRILEX 4 MG MT GUM
4 MG | OROMUCOSAL | Status: DC | PRN
Start: 2015-01-24 — End: 2015-01-28
  Administered 2015-01-24 – 2015-01-27 (×8): 4 mg via ORAL

## 2015-01-24 MED ORDER — GABAPENTIN 300 MG PO CAPS
300 MG | Freq: Four times a day (QID) | ORAL | Status: DC
Start: 2015-01-24 — End: 2015-01-24
  Administered 2015-01-24 (×4): 300 mg via ORAL

## 2015-01-24 MED ORDER — LIDOCAINE 5 % EX PTCH
5 % | Freq: Every day | CUTANEOUS | Status: DC
Start: 2015-01-24 — End: 2015-01-30
  Administered 2015-01-25 – 2015-01-30 (×5): 2 via TRANSDERMAL

## 2015-01-24 MED ORDER — MOMETASONE FURO-FORMOTEROL FUM 200-5 MCG/ACT IN AERO
200-5 MCG/ACT | Freq: Two times a day (BID) | RESPIRATORY_TRACT | Status: DC
Start: 2015-01-24 — End: 2015-01-30
  Administered 2015-01-24 – 2015-01-30 (×14): 2 via RESPIRATORY_TRACT

## 2015-01-24 MED ORDER — TRAZODONE HCL 50 MG PO TABS
50 MG | Freq: Every evening | ORAL | Status: DC
Start: 2015-01-24 — End: 2015-01-29
  Administered 2015-01-24 – 2015-01-29 (×6): 50 mg via ORAL

## 2015-01-24 MED ORDER — HALOPERIDOL LACTATE 5 MG/ML IJ SOLN
5 MG/ML | Freq: Four times a day (QID) | INTRAMUSCULAR | Status: DC | PRN
Start: 2015-01-24 — End: 2015-01-30

## 2015-01-24 MED ORDER — LURASIDONE HCL 40 MG PO TABS
40 MG | Freq: Every day | ORAL | Status: DC
Start: 2015-01-24 — End: 2015-01-26
  Administered 2015-01-24 – 2015-01-25 (×2): 120 mg via ORAL

## 2015-01-24 MED ORDER — PANTOPRAZOLE SODIUM 40 MG PO TBEC
40 MG | Freq: Every day | ORAL | Status: DC
Start: 2015-01-24 — End: 2015-01-23

## 2015-01-24 MED ORDER — IPRATROPIUM-ALBUTEROL 0.5-2.5 (3) MG/3ML IN SOLN
Freq: Three times a day (TID) | RESPIRATORY_TRACT | Status: DC
Start: 2015-01-24 — End: 2015-01-24

## 2015-01-24 MED ORDER — HYDROXYZINE PAMOATE 25 MG PO CAPS
25 MG | Freq: Four times a day (QID) | ORAL | Status: DC | PRN
Start: 2015-01-24 — End: 2015-01-30
  Administered 2015-01-24 – 2015-01-30 (×4): 50 mg via ORAL

## 2015-01-24 MED ORDER — LITHIUM CARBONATE 150 MG PO CAPS
150 MG | Freq: Two times a day (BID) | ORAL | Status: DC
Start: 2015-01-24 — End: 2015-01-27
  Administered 2015-01-24 – 2015-01-27 (×8): 150 mg via ORAL

## 2015-01-24 MED ORDER — FAMOTIDINE 20 MG PO TABS
20 MG | Freq: Once | ORAL | Status: AC
Start: 2015-01-24 — End: 2015-01-23
  Administered 2015-01-24: 20 mg via ORAL

## 2015-01-24 MED ORDER — NICOTINE 21 MG/24HR TD PT24
21 MG/24HR | Freq: Every day | TRANSDERMAL | Status: DC
Start: 2015-01-24 — End: 2015-01-23

## 2015-01-24 MED ORDER — IPRATROPIUM-ALBUTEROL 20-100 MCG/ACT IN AERS
20-100 MCG/ACT | Freq: Three times a day (TID) | RESPIRATORY_TRACT | Status: DC
Start: 2015-01-24 — End: 2015-01-27
  Administered 2015-01-24 – 2015-01-27 (×9): 1 via RESPIRATORY_TRACT

## 2015-01-24 MED ORDER — HALOPERIDOL 5 MG PO TABS
5 MG | Freq: Four times a day (QID) | ORAL | Status: DC | PRN
Start: 2015-01-24 — End: 2015-01-30

## 2015-01-24 MED ORDER — GABAPENTIN 300 MG PO CAPS
300 MG | Freq: Three times a day (TID) | ORAL | Status: DC
Start: 2015-01-24 — End: 2015-01-30
  Administered 2015-01-25 – 2015-01-30 (×17): 600 mg via ORAL

## 2015-01-24 MED ORDER — MAGNESIUM HYDROXIDE 400 MG/5ML PO SUSP
400 MG/5ML | Freq: Every day | ORAL | Status: DC | PRN
Start: 2015-01-24 — End: 2015-01-30

## 2015-01-24 MED ORDER — TIZANIDINE HCL 4 MG PO TABS
4 MG | Freq: Four times a day (QID) | ORAL | Status: DC | PRN
Start: 2015-01-24 — End: 2015-01-30
  Administered 2015-01-25 – 2015-01-30 (×10): 4 mg via ORAL

## 2015-01-24 MED FILL — MOMETASONE FURO-FORMOTEROL FUM 200-5 MCG/ACT IN AERO: 200-5 MCG/ACT | RESPIRATORY_TRACT | Qty: 19.98

## 2015-01-24 MED FILL — HYDROXYZINE PAMOATE 50 MG PO CAPS: 50 MG | ORAL | Qty: 1

## 2015-01-24 MED FILL — PANTOPRAZOLE SODIUM 40 MG PO TBEC: 40 MG | ORAL | Qty: 1

## 2015-01-24 MED FILL — GABAPENTIN 300 MG PO CAPS: 300 MG | ORAL | Qty: 1

## 2015-01-24 MED FILL — TRAZODONE HCL 50 MG PO TABS: 50 MG | ORAL | Qty: 1

## 2015-01-24 MED FILL — IPRATROPIUM-ALBUTEROL 0.5-2.5 (3) MG/3ML IN SOLN: RESPIRATORY_TRACT | Qty: 3

## 2015-01-24 MED FILL — LITHIUM CARBONATE 150 MG PO CAPS: 150 MG | ORAL | Qty: 1

## 2015-01-24 MED FILL — PROVENTIL HFA 108 (90 BASE) MCG/ACT IN AERS: 108 (90 Base) MCG/ACT | RESPIRATORY_TRACT | Qty: 2.66

## 2015-01-24 MED FILL — NICORELIEF 4 MG MT GUM: 4 MG | OROMUCOSAL | Qty: 1

## 2015-01-24 MED FILL — LATUDA 40 MG PO TABS: 40 MG | ORAL | Qty: 3

## 2015-01-24 MED FILL — COMBIVENT RESPIMAT 20-100 MCG/ACT IN AERS: 20-100 MCG/ACT | RESPIRATORY_TRACT | Qty: 200

## 2015-01-24 MED FILL — FAMOTIDINE 20 MG PO TABS: 20 MG | ORAL | Qty: 1
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
## 2015-01-24 NOTE — Plan of Care (Signed)
Problem: Anger Management/Homicidal Ideation  Goal: LTG-Able to display appropriate communication and problem solving  Outcome: Ongoing  Goal: STG-Knowledge of positive coping patterns  Outcome: Ongoing

## 2015-01-24 NOTE — Consults (Signed)
Patient was seen . Pain management consult was dictated. Orders were done .Treament plan  was suggested.  Electronically signed by Earna CoderSameh R Seairra Otani, MD on 01/24/2015 at 6:14 PM

## 2015-01-24 NOTE — Progress Notes (Signed)
Patient was laying in bed.  She was withdrawn, had a flat affect and poor eye contact.  Initially she was alert, was soft spoken and would answer a few questions but then she would close her eyes again.  She was resistant to answer the rest of the questions and after awhile would not respond.  Patient turn around and went back to sleep.Electronically signed by Tressie EllisMaria Teighlor Korson, RAC on 01/24/2015 at 3:03 PM

## 2015-01-24 NOTE — Progress Notes (Signed)
Called pt pharmacy, [facility], to verify medications.  Per pharmacy, pt has not had rx for percocet 5/325mg  since July 2016 and that was only a 2 day supply, prior to that she received one 30 day supply of percocet.   Pt has not filled ibuprofen 800mg  since March 2016 and has a rx for zanaflex but has not had that filled since August 2016.      Electronically signed by Wiktoria Hemrick Lyn Shatira Dobosz, RN on 01/24/2015 at 11:05 AM

## 2015-01-24 NOTE — Progress Notes (Signed)
Pt. refused to attend the 0900 community meeting due to resting in room, despite staff encouragement.Electronically signed by Tressie EllisMaria Salene Mohamud, RAC on 01/24/2015 at 10:55 AM

## 2015-01-24 NOTE — Other (Signed)
Pt refused vitals to be taken. Pt withdrawn and isolated in her room.Pt states she is in 10/10 pain all over. Pt winced when stethescope was placed on chest for assessment.

## 2015-01-24 NOTE — Progress Notes (Signed)
Pt. refused to attend the 1100 skills group due to resting in room, despite staff encouragement.Electronically signed by Jovana Rembold, RAC on 01/24/2015 at 12:10 PM
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
## 2015-01-24 NOTE — Progress Notes (Signed)
Current Order: q4 duoneb     Home Regimen: no      Ordering Physician: Elita Quick  Re-evaluation Date: 12/11  Diagnosis:COPD    Patient Status: Stable / Unstable + Physician notified    The following MDI Criteria must be met in order to convert aerosol to MDI with spacer. If unable to meet, MDI will be converted to aerosol:  []   Patient able to demonstrate the ability to use MDI effectively  []   Patient alert and cooperative  []   Patient able to take deep breath with 5-10 second hold  []   Medication(s) available in this delivery method   []   Peak flow greater than or equal to 200 ml/min            Current Order Substituted To  (same drug, same frequency)   Aerosol to MDI []  Albuterol Sulfate 0.083% unit dose by aerosol Albuterol Sulfate MDI 2 puffs by inhalation with spacer    []  Levalbuterol 1.25 mg unit dose by aerosol Levalbuterol MDI 2 puffs by inhalation with spacer    []  Levalbuterol 0.63 mg unit dose by aerosol Levalbuterol MDI 2 puffs by inhalation with spacer    []  Ipratropium Bromide 0.02% unit dose by aerosol Ipratropium Bromide MDI 2 puffs by inhalation with spacer    []  Duoneb (Ipratropium + Albuterol) unit dose by aerosol Ipratropium MDI + Albuterol MDI 2 puffs by inhalation w/spacer   MDI to Aerosol []  Albuterol Sulfate MDI Albuterol Sulfate 0.083% unit dose by aerosol    []  Levalbuterol MDI 2 puffs by inhalation Levalbuterol 1.25 mg unit dose by aerosol    []  Ipratropium Bromide MDI by inhalation Ipratropium Bromide 0.02% unit dose by aerosol    []  Combivent (Ipratropium + Albuterol) MDI by inhalation Duoneb (Ipratropium + Albuterol) unit dose by aerosol   Treatment Assessment [Frequency/Schedule]:  Change frequency to: __________________________________________________per Protocol, P&T, MEC      Points 0 1 2 3 4    Pulmonary Status  Non-Smoker  []    Smoking history   < 20 pack years  []    Smoking history  ? 20 pack years  []    Pulmonary Disorder  (acute or chronic)  [x]    Severe or Chronic w/  Exacerbation  []      Surgical Status No [x]    Surgeries     General []    Surgery Lower []    Abdominal Thoracic or []    Upper Abdominal Thoracic with  PulmonaryDisorder  []      Chest X-ray Clear/Not  Ordered     [x]   Chronic Changes  Results Pending  []   Infiltrates, atelectasis, pleural effusion, or edema  []   Infiltrates in more than one lobe []   Infiltrate + Atelectasis, &/or pleural effusion  []     Respiratory Pattern Regular,  RR = 12-20 [x]   Increased,  RR = 21-25 []   DOE, irregular,  or RR = 26-30 []   Decreased FEV1  or RR = 31-35 []   Severe SOB, use  of accessory muscles, or RR ? 35  []     Mental Status Alert, oriented,  Cooperative [x]   Confused but Follows commands []   Lethargic or unable to follow commands []   Obtunded  []   Comatose  []     Breath Sounds Clear to  auscultation  []   Decreased unilaterally or  in bases only []   Decreased  bilaterally  []   Crackles or intermittent wheezes [x]   Wheezes []     Cough Strong, Spontan., & nonproductive []   Strong,  spontaneous, &  productive [x]   Weak,  Nonproductive []   Weak, productive or  with wheezes []   No spontaneous  cough or may require suctioning []     Level of Activity Ambulatory []   Ambulatory w/ Assist  [x]   Non-ambulatory []   Paraplegic []   Quadriplegic []     Total    Score:__8_____     Triage Score:__4______      Tri       Triage:     1. (>20) Freq: Q3    2. (16-20) Freq: Q4   3. (11-15) Freq: QID & Albuterol Q2 PRN    4. (6-10) Freq: TID & Albuterol Q2 PRN    5. (0-5) Freq Q4prn

## 2015-01-24 NOTE — Care Coordination-Inpatient (Signed)
FAMILY COLLATERAL NOTE    Family/Support Name: Charmaine Downslisia Frost  Contact #: [phone]  Relationship to Pt: caregiver      Placed call to above and reached the daughter of Alisha Mccall.  Alisha Mccall's daughter stated that her mother does speak [place] and is not a caregiver for anyone.  Her mother is out of town for the next 2 weeks.      When patient was reapproached to ask for an alternate contact she was asleep and would not respond to questions.     Response:          Raelene BottMarie A. Benna DunksBarber, LISW-S

## 2015-01-24 NOTE — Progress Notes (Signed)
Current Order: q4 duoneb     Home Regimen: no      Ordering Physician: Elita Quick  Re-evaluation Date: 28?11    Diagnosis:COPD  Patient Status: Stable / Unstable + Physician notified    The following MDI Criteria must be met in order to convert aerosol to MDI with spacer. If unable to meet, MDI will be converted to aerosol:  []   Patient able to demonstrate the ability to use MDI effectively  []   Patient alert and cooperative  []   Patient able to take deep breath with 5-10 second hold  []   Medication(s) available in this delivery method   []   Peak flow greater than or equal to 200 ml/min            Current Order Substituted To  (same drug, same frequency)   Aerosol to MDI []  Albuterol Sulfate 0.083% unit dose by aerosol Albuterol Sulfate MDI 2 puffs by inhalation with spacer    []  Levalbuterol 1.25 mg unit dose by aerosol Levalbuterol MDI 2 puffs by inhalation with spacer    []  Levalbuterol 0.63 mg unit dose by aerosol Levalbuterol MDI 2 puffs by inhalation with spacer    []  Ipratropium Bromide 0.02% unit dose by aerosol Ipratropium Bromide MDI 2 puffs by inhalation with spacer    [x]  Duoneb (Ipratropium + Albuterol) unit dose by aerosol Ipratropium MDI + Albuterol MDI 2 puffs by inhalation w/spacer   MDI to Aerosol []  Albuterol Sulfate MDI Albuterol Sulfate 0.083% unit dose by aerosol    []  Levalbuterol MDI 2 puffs by inhalation Levalbuterol 1.25 mg unit dose by aerosol    []  Ipratropium Bromide MDI by inhalation Ipratropium Bromide 0.02% unit dose by aerosol    []  Combivent (Ipratropium + Albuterol) MDI by inhalation Duoneb (Ipratropium + Albuterol) unit dose by aerosol   Treatment Assessment [Frequency/Schedule]:  Change frequency to: _TID Combivent_________________________________________________per Protocol, P&T, MEC      Points 0 1 2 3 4    Pulmonary Status  Non-Smoker  []    Smoking history   < 20 pack years  []    Smoking history  ? 20 pack years  []    Pulmonary Disorder  (acute or chronic)  [x]    Severe or Chronic w/  Exacerbation  []      Surgical Status No [x]    Surgeries     General []    Surgery Lower []    Abdominal Thoracic or []    Upper Abdominal Thoracic with  PulmonaryDisorder  []      Chest X-ray Clear/Not  Ordered     [x]   Chronic Changes  Results Pending  []   Infiltrates, atelectasis, pleural effusion, or edema  []   Infiltrates in more than one lobe []   Infiltrate + Atelectasis, &/or pleural effusion  []     Respiratory Pattern Regular,  RR = 12-20 [x]   Increased,  RR = 21-25 []   DOE, irregular,  or RR = 26-30 []   Decreased FEV1  or RR = 31-35 []   Severe SOB, use  of accessory muscles, or RR ? 35  []     Mental Status Alert, oriented,  Cooperative [x]   Confused but Follows commands []   Lethargic or unable to follow commands []   Obtunded  []   Comatose  []     Breath Sounds Clear to  auscultation  []   Decreased unilaterally or  in bases only []   Decreased  bilaterally  []   Crackles or intermittent wheezes [x]   Wheezes []     Cough Strong, Spontan., & nonproductive []   Strong,  spontaneous, &  productive [x]   Weak,  Nonproductive []   Weak, productive or  with wheezes []   No spontaneous  cough or may require suctioning []     Level of Activity Ambulatory []   Ambulatory w/ Assist  [x]   Non-ambulatory []   Paraplegic []   Quadriplegic []     Total    Score:__8_____     Triage Score:___4_____      Tri       Triage:     1. (>20) Freq: Q3    2. (16-20) Freq: Q4   3. (11-15) Freq: QID & Albuterol Q2 PRN    4. (6-10) Freq: TID & Albuterol Q2 PRN    5. (0-5) Freq Q4prn

## 2015-01-24 NOTE — Behavioral Health Treatment Team (Signed)
Group Therapy Note    Date: 01/24/2015  Start Time: 2000  End Time:  2030  Number of Participants: 9    Type of Group: Wrap-Up    Notes:  Patient did not attend group.    Electronically signed by Emi HolesSarah  Eniya Cannady on 01/24/2015 at 11:14 PM

## 2015-01-24 NOTE — Consults (Signed)
[facility]                           [address]  [place], [place]                                       CONSULTATION    PATIENT NAME:  Alisha Mccall, Alisha Mccall.              DOB:      October 08, 1965  MED REC NO:    [id number]                         ROOM:     C3WT [id number]  ACCOUNT NO:    [id number]                        ADMISSION DATE: 01/23/2015  PHYSICIAN:     Earna CoderSameh R Anevay Campanella, MD                   DATE OF CONSULTATION:  01/24/2015    CONSULTING PHYSICIAN:  Mr. Nicki ReaperJohn Dobritch and Dr. Wynelle BourgeoisSaravanan.     PRIMARY CARE PHYSICIAN:  Dr. Everardo PacificNorberto Juan.      REASON FOR CONSULTATION:  Management of intractable back pain and starting  pain medication.    HISTORY OF PRESENT ILLNESS:  This patient is a 49 year old female who is  seeing Dr. Gatha Mayersama Malak as an outpatient for chronic lower back pain as well as  other pain disorders.  She is being managed with the Vicodin for pain.  She  stated that she was admitted to the hospital due to psychological breakdown  and fighting with her family; however, on admission patient discussed her  psychological issue and she is having an isolation feeling and does not want  to talk to the staff.  She described the pain as aching, severe, intractable  and she wants pain medication to manage her pain.  She was taking a lot of   Percocet at home up to 3 times daily that was prescribed by Dr. Christean GriefMalak for the  past year.  However, on admission, patient showed that she is positive for  cocaine and I was consulted for managing her pain.    PAST MEDICAL HISTORY:  She has past medical history significant for pulmonary  embolism, congestive heart failure, pneumonia, osteoarthritis, neuromuscular  disorder, myocardial infarction, acid reflux, COPD, congestive heart failure  and coronary artery disease.    PAST SURGICAL HISTORY:  Positive for a tooth extraction, laparotomy,  hysterectomy and coronary angioplasty, colonoscopy and cardiac catheterization  and abdominal hernia  repair.    FAMILY HISTORY:  She has family history positive for liver cirrhosis, high  blood pressure, ______,   cancer, arthritis, scoliosis and high blood  pressure.    SOCIAL HISTORY:  She has substance abuse history, positive for current  cocaine, admits to smoking, denies any alcohol or marijuana abuse.  She lives  with her family who is always having a social struggle with them.      REVIEW OF SYSTEMS:  A 12 system review positive for what has been mentioned  HPI, otherwise negative.    PHYSICAL EXAMINATION:  VITAL SIGNS:  During the vital signs noted the blood pressure 115/75, pulse  74, saturation 98% room air.  Pain level is  9 without pain medication.  The  patient position sleeping, respiratory rate of 14, temperature 98.5.  GENERAL:  The patient alert, oriented x3, appears to be in depressed mood, but  she is able to communicate appropriately.    HEAD AND NECK:  Normocephalic, atraumatic.  Pupils equal, reactive.  Neck is  supple.  No enlarged lymphadenopathy.  CHEST:  No audible wheezes.  EXTREMITIES:  No deformity.  MUSCULOSKELETAL EXAM:  She has tenderness on the lower lumbar spine.  NEUROLOGIC:  She has no neurological deficits.    DIAGNOSTIC STUDIES:  Laboratory work has been reviewed.  Noted positive for  cocaine.  Rest of the laboratory work seems to be within normal range.   Radiology work has not been done during this admission.    ASSESSMENT:  This is a patient who appears to have psychological issue with  depression and isolation. She has also other associated stress and  psychological factors affecting her pain.  I also had observed a recent  history of cocaine abuse on top of her pain medications. Today we discussed an  important of weaning her off the pain medication.  We will place her on opioid  withdrawal protocol with adding tramadol for the severe pain in the meantime  with no prescription to be given on discharge.  I will continue the patient on  nonopioid therapy including muscle  relaxant, anti-inflammatory which will be  avoided due to her heart failure; however, we will add a Lidoderm patch  instead.  Discussed with the patient follow up plan with Dr. Christean Grief as an  outpatient.    Thank you, Dr. Wynelle Bourgeois, for your referral.  Contact me if any questions  about this patient's care.        Earna Coder, MD      D: 01/24/2015 18:46:09  T: 01/24/2015 22:13:44  SY/lorain  Job#: [id number]  Doc#: [id number]

## 2015-01-25 DIAGNOSIS — F3181 Bipolar II disorder: Secondary | ICD-10-CM

## 2015-01-25 LAB — CULTURE, URINE: Urine Culture, Routine: <50000

## 2015-01-25 MED FILL — LIDOCAINE 5 % EX PTCH: 5 % | CUTANEOUS | Qty: 2

## 2015-01-25 MED FILL — TRAMADOL HCL 50 MG PO TABS: 50 MG | ORAL | Qty: 1

## 2015-01-25 MED FILL — LATUDA 40 MG PO TABS: 40 MG | ORAL | Qty: 3

## 2015-01-25 MED FILL — TRAZODONE HCL 50 MG PO TABS: 50 MG | ORAL | Qty: 1

## 2015-01-25 MED FILL — GABAPENTIN 300 MG PO CAPS: 300 MG | ORAL | Qty: 2

## 2015-01-25 MED FILL — LITHIUM CARBONATE 150 MG PO CAPS: 150 MG | ORAL | Qty: 1

## 2015-01-25 MED FILL — NICORELIEF 4 MG MT GUM: 4 MG | OROMUCOSAL | Qty: 1

## 2015-01-25 NOTE — Progress Notes (Signed)
Pt. refused to attend the 1100 skills group, despite staff encouragement.Electronically signed by Tressie EllisMaria Lou Loewe, RAC on 01/25/2015 at 12:58 PM

## 2015-01-25 NOTE — Behavioral Health Treatment Team (Signed)
Group Therapy Note    Date: 01/25/2015  Start Time: 1945  End Time:  2010  Number of Participants: 13    Type of Group: Wrap-Up    Patient's Goal:  "To stay up all day and eat more"    Status After Intervention:  Improved    Participation Level: Active Listener and Interactive    Participation Quality: Appropriate, Attentive and Sharing      Speech:  normal      Thought Process/Content: Logical      Affective Functioning: Congruent      Mood: euthymic      Level of consciousness:  Alert and Oriented x4      Response to Learning: Progressing to goal      Endings: None Reported    Modes of Intervention: Support and Socialization      Discipline Responsible: Stage managerBehavorial Health Tech      Electronically signed by Emi HolesSarah  Khloee Garza on 01/25/2015 at 11:18 PM
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
## 2015-01-25 NOTE — Plan of Care (Signed)
Problem: Anger Management/Homicidal Ideation  Intervention: Education, Medication-behavioral health  ongoing    Goal: LTG-Able to display appropriate communication and problem solving  Outcome: Met This Shift  Goal: STG-Knowledge of positive coping patterns  Outcome: Ongoing

## 2015-01-25 NOTE — Consults (Signed)
See H and P note

## 2015-01-26 LAB — PROTIME-INR
INR: 1.1
Protime: 12.2 s (ref 8.1–13.7)

## 2015-01-26 MED ORDER — METOPROLOL TARTRATE 25 MG PO TABS
25 MG | Freq: Two times a day (BID) | ORAL | Status: DC
Start: 2015-01-26 — End: 2015-01-30
  Administered 2015-01-26 – 2015-01-30 (×7): 25 mg via ORAL

## 2015-01-26 MED ORDER — LURASIDONE HCL 40 MG PO TABS
40 MG | Freq: Every day | ORAL | Status: DC
Start: 2015-01-26 — End: 2015-01-26
  Administered 2015-01-27: 02:00:00 120 mg via ORAL

## 2015-01-26 MED ORDER — CARVEDILOL 6.25 MG PO TABS
6.25 MG | Freq: Two times a day (BID) | ORAL | Status: DC
Start: 2015-01-26 — End: 2015-01-29
  Administered 2015-01-26: 21:00:00 3.125 mg via ORAL
  Administered 2015-01-27: 15:00:00 3.25 mg via ORAL
  Administered 2015-01-28 – 2015-01-29 (×3): 3.125 mg via ORAL

## 2015-01-26 MED FILL — TRAMADOL HCL 50 MG PO TABS: 50 MG | ORAL | Qty: 1

## 2015-01-26 MED FILL — NICORELIEF 4 MG MT GUM: 4 MG | OROMUCOSAL | Qty: 1

## 2015-01-26 MED FILL — CARVEDILOL 6.25 MG PO TABS: 6.25 MG | ORAL | Qty: 1

## 2015-01-26 MED FILL — LIDOCAINE 5 % EX PTCH: 5 % | CUTANEOUS | Qty: 2

## 2015-01-26 MED FILL — METOPROLOL TARTRATE 25 MG PO TABS: 25 MG | ORAL | Qty: 1

## 2015-01-26 MED FILL — LITHIUM CARBONATE 150 MG PO CAPS: 150 MG | ORAL | Qty: 1

## 2015-01-26 MED FILL — PANTOPRAZOLE SODIUM 40 MG PO TBEC: 40 MG | ORAL | Qty: 1

## 2015-01-26 MED FILL — TRAZODONE HCL 50 MG PO TABS: 50 MG | ORAL | Qty: 1

## 2015-01-26 MED FILL — GABAPENTIN 300 MG PO CAPS: 300 MG | ORAL | Qty: 2

## 2015-01-26 MED FILL — ACETAMINOPHEN 325 MG PO TABS: 325 MG | ORAL | Qty: 2

## 2015-01-26 NOTE — Progress Notes (Signed)
Call placed to hospitalist answering service regarding pt's home medication coumadin. Call back received from Dr. Otho NajjarMasari. Reviewed with doctor, pt's chart and labs, informed and aware of pt's INR (1.1) and that currently pt's coumadin has not been resumed, and no lab work had been ordered. No new orders received. Electronically signed by Helane GuntherEmily Marie Isauro Skelley, RN on 01/26/2015 at 11:57 PM

## 2015-01-26 NOTE — Plan of Care (Incomplete)
Problem: Anger Management/Homicidal Ideation  Intervention: Education, Medication-behavioral health  Ongoing     Goal: LTG-Able to display appropriate communication and problem solving  Outcome: Ongoing  Goal: STG-Knowledge of positive coping patterns  Outcome: Ongoing

## 2015-01-26 NOTE — Progress Notes (Signed)
Pt is pleased that she had her latuda changed  To bedtime so she can be more alert during the day . Pt states she feels less depressed but her moods  Are not stable yet . Pt continue to complain of poor sleep and her pain is not in control yet

## 2015-01-26 NOTE — Progress Notes (Signed)
Group Therapy Note    Date: 01/26/15  Start Time:0900  End Time:  0915    Number of Participants:9    Type of Group: Psychoeducation    Patient's Goal:  To participate in the morning meeting.    Notes: Patient declined to attend psychoeducation group at 0900despite encouragement by staff.     Discipline Responsible: Social Worker/Counselor    Lucretia FieldAnita M Kyrell Ruacho, LISW

## 2015-01-26 NOTE — Progress Notes (Signed)
Pt stated that taking the flexaril and ultram took her back down from an 8 to a 5 this is bearABLE FOR HER AND SHE STATES HER PAIN LEVEL NEVER GETS ANY LOWER THAN THIS . SHE IS PLEASED WITH HER COMFORT LEVEL NOW . HER MOOD IS STABILIZING AND SHE NO LONGER FEELS SUICIDAL . SHE HAS BEEN SOCIAL WITH PEERS NEAT IN APPEARANCE AND ENJOYS SOCIALIZING WITH OTHERS .

## 2015-01-26 NOTE — Progress Notes (Signed)
Group Therapy Note    Date: 01/26/2015  Start Time: 1115  End Time:  1200    Number of Participants:8    Type of Group: Psychoeducation    Patient's Goal: To participate in activities group.    Notes:  Patient completed one task. Seemed to enjoy.    Status After Intervention:  Improved    Participation Level: Active Listener    Participation Quality: Appropriate      Speech:  normal      Thought Process/Content: Logical      Affective Functioning: Congruent      Mood: elevated      Level of consciousness:  Alert      Response to Learning: Able to verbalize current knowledge/experience      Endings: None Reported    Modes of Intervention: Education      Discipline Responsible: Social Worker/Counselor      Signature:  Lucretia FieldAnita M Tuyet Bader, LISW

## 2015-01-26 NOTE — Progress Notes (Signed)
Medicated with 650mg  of tylenol for c/o headache.

## 2015-01-26 NOTE — Progress Notes (Signed)
BEHAVIORAL HEALTH FOLLOW-UP NOTE I    01/26/2015    [x]  Patient was seen and examined in person  [x]  Chart reviewed  [x]  Labs reviewed  [x]  Patient's case discussed with staff/team    Chief Complaint: Feeling better  Interim History: Pain is controllable with addition of ultram and flexiril. No longer suicidal, mood is stabilizing .No lonerr suicidal  Appetite:  [x]  Normal/Unchanged  []  Increased  []  Decreased  SI []  Present  []  Absent    Sleep:       []  Normal/Unchanged  [x]  Fair       []  Poor          HI  [] Present  [x]  Absent    Energy:    [x]  Normal/Unchanged  []  Increased  []  Decreased   Plan []  Present                          []  Absent  []  N/A    Patient is [x]  able  []  unable to CONTRACT FOR SAFETY   Aggression:  []  yes  [x]  no  Medication side effects(SE):  [x]  None(Psych. Meds.) []  Other  Well tolerated: [x]  yes  []  no    Examination:  Visit Vitals   ??? BP 123/80   ??? Pulse 99   ??? Temp 98 ??F (36.7 ??C) (Oral)   ??? Resp 16   ??? Ht 5\' 4"  (1.626 m)   ??? Wt 172 lb (78 kg)   ??? SpO2 98%   ??? BMI 29.52 kg/m2       Appearance: [x]  casual  []  anxious  []  confused  []  blunted  []  silly  []  poor hygiene  []  poor grooming  Gait:  [x]  stable  []  unstable  []  limping  []  shuffling  []  in wheel chair or other support    Mental Status:   Orientation: Date/Time: [x]  yes  []  no Place: [x]  yes  []  no     Person: [x]  yes  []  no  Level of Consciousness: [x]  alert  []  drowsy  []  tired  []  lethargic  []  distractable  []  asleep  []  could not be assessed    Manner:   []  Cooperative  []  Guarded  []  Suspicious  []  Irritable  []  Hostile  []  Withdrawn  []  Other    Motor Activity:   [x]  Normal  []  Agitation  []  Motor retardation  []  Tremor  []  Other    Musculoskeletal:   [x]  Normal  []  Rigidity  []  Cogwheel  []  Flaccid  []  Tics/TD  []  Other    Speech:   [x]  Normal  []  Soft/Loud  []  Slow/Pressured  []  Dysarthria  []  Incoherent  []  Other    Language:  [x]  Normal  []  Expressive Aphasia  []  Fluent Aphasia  []  Other    Mood:  []  Euthymic  [x]   Depressed  []  Irritable  []  Angry  []  Anxious  []  Fearful  []  Apathetic  []  Euphoric  []  Other    Affect:  []  Euthymic  []  Depressed  []  Blunted  []  Flat  []  Irritable  []  Angry  [x]  Anxious    []  Labile  []  Expansive  []  Exaggerated  []  Other    Thought Process/Association:   [x]  Normal  []  Tangential  []  Circumstantial  []  Poverty of Thought  []  Concrete  []  Disorganized  []  Racing Thoughts  []  Flight of Ideas  []  Loose  []  Other  Thought Contents:   []  Hopelessness  []  Worthlessness  []  Hypochondriasis  []  Delusions  []  Paranoia  [x]  Ruminations  []  Obsessions/Compulsions  []  Confused []  Hopeful   []  Future Oriented []  Other    Perception:  [x]  Normal  []  Hallucinations  []  Auditory  []  Visual  []  Olfactory  []  Tactile    []  Dissociation  []  Flashbacks  []  Other    Attention/Concentration:  [x]  Intact  []  Poor  []  Distractible  []  Other    Cognition:  [x]  Intact  []  Impaired   Insight: []  Intact  [x]  Fair  []  Limited   Short Term Memory:  [x]  Intact  []  Impaired  []  Poor  Judgement:  []  Intact  []  Fair  []  Limited  Remote Memory:  [x]  Intact  []  Impaired  []  Poor  MMSE Score:        BEHAVIORAL HEALTH FOLLOW-UP NOTE II    01/26/2015    PAST MEDICAL/PSYCHIATRIC HISTORY:   Past Medical History   Diagnosis Date   ??? CAD (coronary artery disease)    ??? Cardiac angina (HCC)    ??? CHF (congestive heart failure) (HCC)    ??? COPD (chronic obstructive pulmonary disease) (HCC)    ??? 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 GERD (gastroesophageal reflux disease)    ??? History of blood transfusion    ??? Hyperlipidemia    ??? Hypertension    ??? MI (myocardial infarction) (HCC)    ??? Neuromuscular disorder (HCC)    ??? Osteoarthritis    ??? Pneumonia    ??? Psychiatric problem    ??? Pulmonary embolism (HCC)    ??? Scoliosis        FAMILY/SOCIAL HISTORY:  Family History   Problem Relation Age of Onset   ??? Cirrhosis Mother    ??? Other Mother      Hepatitis B   ??? High Blood Pressure Father    ??? Cancer Maternal Grandmother    ??? Arthritis  Maternal Grandmother    ??? Cancer Paternal Grandmother    ??? Scoliosis Paternal Grandmother    ??? High Blood Pressure Paternal Grandfather      Social History     Social History   ??? Marital status: Widowed     Spouse name: N/A   ??? Number of children: N/A   ??? Years of education: N/A     Occupational History   ??? Not on file.     Social History Main Topics   ??? Smoking status: Current Every Day Smoker     Packs/day: 1.50     Years: 30.00   ??? Smokeless tobacco: Not on file   ??? Alcohol use No   ??? Drug use: No   ??? Sexual activity: Not Currently     Other Topics Concern   ??? Not on file     Social History Narrative       LABS:  Lab Results   Component Value Date    LITHIUM 0.2 (L) 01/24/2015    NA 136 01/23/2015    BUN 6 01/23/2015    CREATININE 1.02 (H) 01/23/2015    TSH 1.070 01/23/2015    WBC 7.0 01/23/2015     No results found for: PHENYTOIN, PHENOBARB, VALPROATE, CBMZ  Lab Results   Component Value Date    INR 1.1 01/26/2015    PROTIME 12.2 01/26/2015     Lab Results   Component Value Date    APTT  37.4 (H) 01/23/2015     No results for input(s): ETOH in the last 72 hours.  @FLRESURINE @      ROS:  [x]  All negative/unchanged except if checked. Explain positive(checked items) below:  []  Constitutional  []  Eyes  []  Ear/Nose/Mouth/Throat  []  Respiratory  []  CV  []  GI  []  GU  []  Musculoskeletal  []  Skin/Breast  []  Neurological  []  Endocrine  []  Heme/Lymph  []  Allergic/Immunologic    Explanation:     MEDICATIONS:    Current Facility-Administered Medications:   ???  metoprolol tartrate (LOPRESSOR) tablet 25 mg, 25 mg, Oral, BID, Josph MachoMuhammad N Tori Cupps, MD, 25 mg at 01/26/15 1405  ???  carvedilol (COREG) tablet 3.125 mg, 3.125 mg, Oral, BID WC, Josph MachoMuhammad N Damyiah Moxley, MD, 3.125 mg at 01/26/15 1611  ???  [START ON 01/27/2015] lurasidone (LATUDA) tablet 120 mg, 120 mg, Oral, Nightly, Josph MachoMuhammad N Ladonna Vanorder, MD  ???  traZODone (DESYREL) tablet 50 mg, 50 mg, Oral, Nightly, Josph MachoMuhammad N Jahmia Berrett, MD, 50 mg at 01/26/15 2044  ???  albuterol-ipratropium (COMBIVENT  RESPIMAT) 20-100 MCG/ACT inhaler 1 puff, 1 puff, Inhalation, TID, Gillermina HuBalaji Saravanan, MD, 1 puff at 01/26/15 1956  ???  tiZANidine (ZANAFLEX) tablet 4 mg, 4 mg, Oral, Q6H PRN, Earna CoderSameh R Yonan, MD, 4 mg at 01/26/15 1407  ???  lidocaine (LIDODERM) 5 % 2 patch, 2 patch, Transdermal, Daily, Earna CoderSameh R Yonan, MD, 2 patch at 01/26/15 2044  ???  traMADol (ULTRAM) tablet 50 mg, 50 mg, Oral, Q6H PRN, Earna CoderSameh R Yonan, MD, 50 mg at 01/26/15 2044  ???  gabapentin (NEURONTIN) capsule 600 mg, 600 mg, Oral, TID, Earna CoderSameh R Yonan, MD, 600 mg at 01/26/15 2045  ???  pantoprazole (PROTONIX) tablet 40 mg, 40 mg, Oral, QAM AC, Lauren O Portman, DO, 40 mg at 01/26/15 0538  ???  albuterol sulfate HFA 108 (90 BASE) MCG/ACT inhaler 2 puff, 2 puff, Inhalation, Q4H PRN, Josph MachoMuhammad N Meagan Ancona, MD  ???  lithium capsule 150 mg, 150 mg, Oral, BID, Josph MachoMuhammad N Fortune Torosian, MD, 150 mg at 01/26/15 2044  ???  mometasone-formoterol (DULERA) 200-5 MCG/ACT inhaler 2 puff, 2 puff, Inhalation, BID, Josph MachoMuhammad N Urijah Arko, MD, 2 puff at 01/26/15 1957  ???  acetaminophen (TYLENOL) tablet 650 mg, 650 mg, Oral, Q4H PRN, Earna CoderSameh R Yonan, MD, 650 mg at 01/26/15 1616  ???  magnesium hydroxide (MILK OF MAGNESIA) 400 MG/5ML suspension 30 mL, 30 mL, Oral, Daily PRN, Josph MachoMuhammad N Spiros Greenfeld, MD  ???  haloperidol lactate (HALDOL) injection 5 mg, 5 mg, Intramuscular, Q6H PRN, Josph MachoMuhammad N Huntington Leverich, MD  ???  haloperidol (HALDOL) tablet 5 mg, 5 mg, Oral, Q6H PRN, Josph MachoMuhammad N Amarisa Wilinski, MD  ???  hydrOXYzine (VISTARIL) capsule 50 mg, 50 mg, Oral, Q6H PRN, Josph MachoMuhammad N Seila Liston, MD, 50 mg at 01/23/15 2313  ???  hydrOXYzine (VISTARIL) injection 50 mg, 50 mg, Intramuscular, Q6H PRN, Josph MachoMuhammad N Swan [place], MD  ???  nicotine polacrilex (NICORETTE) gum 4 mg, 4 mg, Oral, PRN, Clayborne ArtistJohn Michael Dobritch, PA, 4 mg at 01/26/15 2044    PSYCHOTHERAPY/COUNSELING:  []  Therapeutic interview  [x]  Supportive  []  CBT  []  Ongoing  []  Other    ASSESSMENT:  Patient is:  []  Well controlled  [x]  Improving  []  Worsening  []  Other    []  Patient continues to need, on a daily basis, active  treatment furnished directly by or     requiring the supervision of inpatient psychiatric personnel     Diagnosis:  Active Problems:    Chronic pain syndrome  Long term current use of opiate analgesic    Cocaine abuse    Low back pain  Bipolar Type 2, Depressed    Treatment Plan:   Continue Current Medications   Continue Follow-up   Continue Labs  Changed Latuda from am to hs per patient request  Group  Milieu  Psycho-education  Collaterals     Risks, benefits, side effects, drug-to-drug interactions and alternatives to treatment were discussed in my usual manner.    Reason for more than one antipsychotic:   N/A   3 failed monotherapy(drugs tried):   Cross over to a new antipsychotic   Taper to monotherapy from polypharmacy   Augmentation of Clozapine therapy due to treatment resistance to single therapy      Electronically signed by Josph Macho, MD on 01/26/2015 at 11:28 PM

## 2015-01-27 MED ORDER — LITHIUM CARBONATE 300 MG PO CAPS
300 MG | Freq: Two times a day (BID) | ORAL | Status: DC
Start: 2015-01-27 — End: 2015-01-30
  Administered 2015-01-28 – 2015-01-30 (×6): 300 mg via ORAL

## 2015-01-27 MED ORDER — WARFARIN DAILY DOSING BY PHARMACIST (PLACEHOLDER)
Status: DC
Start: 2015-01-27 — End: 2015-01-28

## 2015-01-27 MED ORDER — IPRATROPIUM-ALBUTEROL 20-100 MCG/ACT IN AERS
20-100 MCG/ACT | RESPIRATORY_TRACT | Status: DC | PRN
Start: 2015-01-27 — End: 2015-01-30

## 2015-01-27 MED ORDER — LURASIDONE HCL 40 MG PO TABS
40 MG | Freq: Every evening | ORAL | Status: DC
Start: 2015-01-27 — End: 2015-01-30
  Administered 2015-01-27 – 2015-01-29 (×3): 120 mg via ORAL

## 2015-01-27 MED ORDER — WARFARIN SODIUM 5 MG PO TABS
5 MG | Freq: Once | ORAL | Status: AC
Start: 2015-01-27 — End: 2015-01-27
  Administered 2015-01-27: 12:00:00 5 mg via ORAL

## 2015-01-27 MED ORDER — WARFARIN SODIUM 5 MG PO TABS
5 MG | Freq: Every day | ORAL | Status: DC
Start: 2015-01-27 — End: 2015-01-27

## 2015-01-27 MED ORDER — ENOXAPARIN SODIUM 80 MG/0.8ML SC SOLN
80 MG/0.8ML | Freq: Two times a day (BID) | SUBCUTANEOUS | Status: DC
Start: 2015-01-27 — End: 2015-01-28
  Administered 2015-01-27 – 2015-01-28 (×3): 80 mg/kg via SUBCUTANEOUS

## 2015-01-27 MED FILL — TRAMADOL HCL 50 MG PO TABS: 50 MG | ORAL | Qty: 1

## 2015-01-27 MED FILL — NICORELIEF 4 MG MT GUM: 4 MG | OROMUCOSAL | Qty: 1

## 2015-01-27 MED FILL — LITHIUM CARBONATE 150 MG PO CAPS: 150 MG | ORAL | Qty: 1

## 2015-01-27 MED FILL — GABAPENTIN 300 MG PO CAPS: 300 MG | ORAL | Qty: 2

## 2015-01-27 MED FILL — CARVEDILOL 6.25 MG PO TABS: 6.25 MG | ORAL | Qty: 1

## 2015-01-27 MED FILL — METOPROLOL TARTRATE 25 MG PO TABS: 25 MG | ORAL | Qty: 1

## 2015-01-27 MED FILL — LATUDA 40 MG PO TABS: 40 MG | ORAL | Qty: 3

## 2015-01-27 MED FILL — PANTOPRAZOLE SODIUM 40 MG PO TBEC: 40 MG | ORAL | Qty: 1

## 2015-01-27 MED FILL — TIZANIDINE HCL 4 MG PO TABS: 4 MG | ORAL | Qty: 1

## 2015-01-27 MED FILL — TRAZODONE HCL 50 MG PO TABS: 50 MG | ORAL | Qty: 1

## 2015-01-27 MED FILL — COUMADIN 5 MG PO TABS: 5 MG | ORAL | Qty: 1

## 2015-01-27 MED FILL — COMBIVENT RESPIMAT 20-100 MCG/ACT IN AERS: 20-100 MCG/ACT | RESPIRATORY_TRACT | Qty: 200

## 2015-01-27 MED FILL — LIDOCAINE 5 % EX PTCH: 5 % | CUTANEOUS | Qty: 2

## 2015-01-27 MED FILL — LOVENOX 80 MG/0.8ML SC SOLN: 80 MG/0.8ML | SUBCUTANEOUS | Qty: 0.8

## 2015-01-27 NOTE — Progress Notes (Signed)
Call received from Dr. Maisie Fushomas regarding pt's coumadin medication. New orders received. Electronically signed by Helane GuntherEmily Marie Ruchel Brandenburger, RN on 01/27/2015 at 6:39 AM

## 2015-01-27 NOTE — Progress Notes (Signed)
BEHAVIORAL HEALTH FOLLOW-UP NOTE I    01/27/2015    [x]  Patient was seen and examined in person  [x]  Chart reviewed  [x]  Labs reviewed  [x]  Patient's case discussed with staff/team    Chief Complaint: Still labile  Interim History: Pain is controllable with addition of ultram and flexiril. No longer suicidal, mood is stabilizing .No longer suicidal. Was tearful when talking about her son who is dependent on her since his aneurysm.Needs a little more stabilization.  Appetite:  [x]  Normal/Unchanged  []  Increased  []  Decreased  SI []  Present  []  Absent    Sleep:       []  Normal/Unchanged  [x]  Fair       []  Poor          HI  [] Present  [x]  Absent    Energy:    [x]  Normal/Unchanged  []  Increased  []  Decreased   Plan []  Present                          []  Absent  []  N/A    Patient is [x]  able  []  unable to CONTRACT FOR SAFETY   Aggression:  []  yes  [x]  no  Medication side effects(SE):  [x]  None(Psych. Meds.) []  Other  Well tolerated: [x]  yes  []  no    Examination:  Visit Vitals   ??? BP 121/78   ??? Pulse 50   ??? Temp 98 ??F (36.7 ??C)   ??? Resp 16   ??? Ht 5\' 4"  (1.626 m)   ??? Wt 172 lb (78 kg)   ??? SpO2 96%   ??? BMI 29.52 kg/m2       Appearance: [x]  casual  []  anxious  []  confused  []  blunted  []  silly  []  poor hygiene  []  poor grooming  Gait:  [x]  stable  []  unstable  []  limping  []  shuffling  []  in wheel chair or other support    Mental Status:   Orientation: Date/Time: [x]  yes  []  no Place: [x]  yes  []  no     Person: [x]  yes  []  no  Level of Consciousness: [x]  alert  []  drowsy  []  tired  []  lethargic  []  distractable  []  asleep  []  could not be assessed    Manner:   []  Cooperative  []  Guarded  []  Suspicious  []  Irritable  []  Hostile  []  Withdrawn  []  Other    Motor Activity:   [x]  Normal  []  Agitation  []  Motor retardation  []  Tremor  []  Other    Musculoskeletal:   [x]  Normal  []  Rigidity  []  Cogwheel  []  Flaccid  []  Tics/TD  []  Other    Speech:   [x]  Normal  []  Soft/Loud  []  Slow/Pressured  []  Dysarthria  []  Incoherent  []   Other    Language:  [x]  Normal  []  Expressive Aphasia  []  Fluent Aphasia  []  Other    Mood:  []  Euthymic  [x]  Depressed  []  Irritable  []  Angry  []  Anxious  []  Fearful  []  Apathetic  []  Euphoric  []  Other    Affect:  []  Euthymic  []  Depressed  []  Blunted  []  Flat  []  Irritable  []  Angry  [x]  Anxious    []  Labile  []  Expansive  []  Exaggerated  []  Other    Thought Process/Association:   [x]  Normal  []  Tangential  []  Circumstantial  []  Poverty of Thought  []  Concrete  []   Disorganized  []  Racing Thoughts  []  Flight of Ideas  []  Loose  []  Other    Thought Contents:   []  Hopelessness  []  Worthlessness  []  Hypochondriasis  []  Delusions  []  Paranoia  [x]  Ruminations  []  Obsessions/Compulsions  []  Confused []  Hopeful   []  Future Oriented []  Other    Perception:  [x]  Normal  []  Hallucinations  []  Auditory  []  Visual  []  Olfactory  []  Tactile    []  Dissociation  []  Flashbacks  []  Other    Attention/Concentration:  [x]  Intact  []  Poor  []  Distractible  []  Other    Cognition:  [x]  Intact  []  Impaired   Insight: []  Intact  [x]  Fair  []  Limited   Short Term Memory:  [x]  Intact  []  Impaired  []  Poor  Judgement:  []  Intact  []  Fair  []  Limited  Remote Memory:  [x]  Intact  []  Impaired  []  Poor  MMSE Score:        BEHAVIORAL HEALTH FOLLOW-UP NOTE II    01/27/2015    PAST MEDICAL/PSYCHIATRIC HISTORY:   Past Medical History   Diagnosis Date   ??? CAD (coronary artery disease)    ??? Cardiac angina (HCC)    ??? CHF (congestive heart failure) (HCC)    ??? COPD (chronic obstructive pulmonary disease) (HCC)    ??? 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 GERD (gastroesophageal reflux disease)    ??? History of blood transfusion    ??? Hyperlipidemia    ??? Hypertension    ??? MI (myocardial infarction) (HCC)    ??? Neuromuscular disorder (HCC)    ??? Osteoarthritis    ??? Pneumonia    ??? Psychiatric problem    ??? Pulmonary embolism (HCC)    ??? Scoliosis        FAMILY/SOCIAL HISTORY:  Family History   Problem Relation Age of Onset   ??? Cirrhosis Mother    ??? Other  Mother      Hepatitis B   ??? High Blood Pressure Father    ??? Cancer Maternal Grandmother    ??? Arthritis Maternal Grandmother    ??? Cancer Paternal Grandmother    ??? Scoliosis Paternal Grandmother    ??? High Blood Pressure Paternal Grandfather      Social History     Social History   ??? Marital status: Widowed     Spouse name: N/A   ??? Number of children: N/A   ??? Years of education: N/A     Occupational History   ??? Not on file.     Social History Main Topics   ??? Smoking status: Current Every Day Smoker     Packs/day: 1.50     Years: 30.00   ??? Smokeless tobacco: Not on file   ??? Alcohol use No   ??? Drug use: No   ??? Sexual activity: Not Currently     Other Topics Concern   ??? Not on file     Social History Narrative       LABS:  Lab Results   Component Value Date    LITHIUM 0.2 (L) 01/24/2015    NA 136 01/23/2015    BUN 6 01/23/2015    CREATININE 1.02 (H) 01/23/2015    TSH 1.070 01/23/2015    WBC 7.0 01/23/2015     No results found for: PHENYTOIN, PHENOBARB, VALPROATE, CBMZ  Lab Results   Component Value Date    INR 1.1 01/26/2015  PROTIME 12.2 01/26/2015     Lab Results   Component Value Date    APTT 37.4 (H) 01/23/2015     No results for input(s): ETOH in the last 72 hours.  @FLRESURINE @      ROS:  [x]  All negative/unchanged except if checked. Explain positive(checked items) below:  []  Constitutional  []  Eyes  []  Ear/Nose/Mouth/Throat  []  Respiratory  []  CV  []  GI  []  GU  []  Musculoskeletal  []  Skin/Breast  []  Neurological  []  Endocrine  []  Heme/Lymph  []  Allergic/Immunologic    Explanation:     MEDICATIONS:    Current Facility-Administered Medications:   ???  enoxaparin (LOVENOX) injection 80 mg, 1 mg/kg, Subcutaneous, BID, Trenton Gammon, MD, 80 mg at 01/27/15 0934  ???  [START ON 01/28/2015] warfarin (COUMADIN) daily dosing (placeholder), , Other, RX Placeholder, Trenton Gammon, MD  ???  albuterol-ipratropium (COMBIVENT RESPIMAT) 20-100 MCG/ACT inhaler 1 puff, 1 puff, Inhalation, Q4H PRN, Gillermina Hu, MD  ???  lithium  capsule 300 mg, 300 mg, Oral, BID, Josph Macho, MD  ???  metoprolol tartrate (LOPRESSOR) tablet 25 mg, 25 mg, Oral, BID, Josph Macho, MD, 25 mg at 01/27/15 0945  ???  carvedilol (COREG) tablet 3.125 mg, 3.125 mg, Oral, BID WC, Josph Macho, MD, 3.25 mg at 01/27/15 0102  ???  lurasidone (LATUDA) tablet 120 mg, 120 mg, Oral, Nightly, Josph Macho, MD, 120 mg at 01/27/15 1716  ???  traZODone (DESYREL) tablet 50 mg, 50 mg, Oral, Nightly, Josph Macho, MD, 50 mg at 01/26/15 2044  ???  tiZANidine (ZANAFLEX) tablet 4 mg, 4 mg, Oral, Q6H PRN, Earna Coder, MD, 4 mg at 01/27/15 1717  ???  lidocaine (LIDODERM) 5 % 2 patch, 2 patch, Transdermal, Daily, Earna Coder, MD, 2 patch at 01/26/15 2044  ???  traMADol (ULTRAM) tablet 50 mg, 50 mg, Oral, Q6H PRN, Earna Coder, MD, 50 mg at 01/27/15 1717  ???  gabapentin (NEURONTIN) capsule 600 mg, 600 mg, Oral, TID, Earna Coder, MD, 600 mg at 01/27/15 1336  ???  pantoprazole (PROTONIX) tablet 40 mg, 40 mg, Oral, QAM AC, Lauren O Portman, DO, 40 mg at 01/27/15 7253  ???  mometasone-formoterol (DULERA) 200-5 MCG/ACT inhaler 2 puff, 2 puff, Inhalation, BID, Josph Macho, MD, 2 puff at 01/27/15 [id number]  ???  acetaminophen (TYLENOL) tablet 650 mg, 650 mg, Oral, Q4H PRN, Earna Coder, MD, 650 mg at 01/26/15 1616  ???  magnesium hydroxide (MILK OF MAGNESIA) 400 MG/5ML suspension 30 mL, 30 mL, Oral, Daily PRN, Josph Macho, MD  ???  haloperidol lactate (HALDOL) injection 5 mg, 5 mg, Intramuscular, Q6H PRN, Josph Macho, MD  ???  haloperidol (HALDOL) tablet 5 mg, 5 mg, Oral, Q6H PRN, Josph Macho, MD  ???  hydrOXYzine (VISTARIL) capsule 50 mg, 50 mg, Oral, Q6H PRN, Josph Macho, MD, 50 mg at 01/23/15 2313  ???  hydrOXYzine (VISTARIL) injection 50 mg, 50 mg, Intramuscular, Q6H PRN, Josph Macho, MD  ???  nicotine polacrilex (NICORETTE) gum 4 mg, 4 mg, Oral, PRN, Clayborne Artist Dobritch, PA, 4 mg at 01/27/15 1336    PSYCHOTHERAPY/COUNSELING:  []  Therapeutic interview  [x]   Supportive  []  CBT  []  Ongoing  []  Other    ASSESSMENT:  Patient is:  []  Well controlled  [x]  Improving  []  Worsening  []  Other    [x]  Patient continues to need, on a daily basis, active treatment furnished directly by  or     requiring the supervision of inpatient psychiatric personnel     Diagnosis:  Active Problems:    Bipolar 2 disorder (HCC)    Chronic pain syndrome    Long term current use of opiate analgesic    Cocaine abuse    Low back pain  Bipolar Type 2, Depressed    Treatment Plan:   Continue Current Medications   Continue Follow-up   Continue Labs  Changed Latuda from am to hs per patient request  Increase Lithium to 300 mg BID  Lithium level on 01/29/15  Group  Milieu  Psycho-education  Collaterals     Risks, benefits, side effects, drug-to-drug interactions and alternatives to treatment were discussed in my usual manner.    Reason for more than one antipsychotic:   N/A   3 failed monotherapy(drugs tried):   Cross over to a new antipsychotic   Taper to monotherapy from polypharmacy   Augmentation of Clozapine therapy due to treatment resistance to single therapy      Electronically signed by Josph Macho, MD on 01/27/2015 at 5:37 PM

## 2015-01-27 NOTE — Progress Notes (Signed)
Current Order:  Combivent TID, Albuterol MDI Q2PRN      Home Regimen: Combivent PRN      Ordering Physician: Soundra Pilon  Re-evaluation Date:  12/14    Diagnosis: Bipolar disorder     Patient Status: Stable / Unstable + Physician notified    The following MDI Criteria must be met in order to convert aerosol to MDI with spacer. If unable to meet, MDI will be converted to aerosol:  []  Patient able to demonstrate the ability to use MDI effectively  []  Patient alert and cooperative  []  Patient able to take deep breath with 5-10 second hold  []  Medication(s) available in this delivery method   []  Peak flow greater than or equal to 200 ml/min            Current Order Substituted To  (same drug, same frequency)   Aerosol to MDI [] Albuterol Sulfate 0.083% unit dose by aerosol Albuterol Sulfate MDI 2 puffs by inhalation with spacer    [] Levalbuterol 1.25 mg unit dose by aerosol Levalbuterol MDI 2 puffs by inhalation with spacer    [] Levalbuterol 0.63 mg unit dose by aerosol Levalbuterol MDI 2 puffs by inhalation with spacer    [] Ipratropium Bromide 0.02% unit dose by aerosol Ipratropium Bromide MDI 2 puffs by inhalation with spacer    [] Duoneb (Ipratropium + Albuterol) unit dose by aerosol Ipratropium MDI + Albuterol MDI 2 puffs by inhalation w/spacer   MDI to Aerosol [] Albuterol Sulfate MDI Albuterol Sulfate 0.083% unit dose by aerosol    [] Levalbuterol MDI 2 puffs by inhalation Levalbuterol 1.25 mg unit dose by aerosol    [] Ipratropium Bromide MDI by inhalation Ipratropium Bromide 0.02% unit dose by aerosol    [] Combivent (Ipratropium + Albuterol) MDI by inhalation Duoneb (Ipratropium + Albuterol) unit dose by aerosol   Treatment Assessment [Frequency/Schedule]:  Change frequency to: Combivent Q4 PRN per Protocol, P&T, MEC      Points 0 _0 Pulmonary Status  Non-Smoker  []   Smoking history   < 20 pack years  []   Smoking history  ? 20 pack years  []   Pulmonary Disorder  (acute or chronic)  [x]   Severe  or Chronic w/ Exacerbation  []     Surgical Status No [x]   Surgeries     General []   Surgery Lower []   Abdominal Thoracic or []   Upper Abdominal Thoracic with  PulmonaryDisorder  []     Chest X-ray Clear/Not  Ordered     [x]  Chronic Changes  Results Pending  []  Infiltrates, atelectasis, pleural effusion, or edema  []  Infiltrates in more than one lobe []  Infiltrate + Atelectasis, &/or pleural effusion  []    Respiratory Pattern Regular,  RR = 12-20 [x]  Increased,  RR = 21-25 []  DOE, irregular,  or RR = 26-30 []  Decreased FEV1  or RR = 31-35 []  Severe SOB, use  of accessory muscles, or RR ? 35  []    Mental Status Alert, oriented,  Cooperative [x]  Confused but Follows commands []  Lethargic or unable to follow commands []  Obtunded  []  Comatose  []    Breath Sounds Clear to  auscultation  [x]  Decreased unilaterally or  in bases only [x]  Decreased  bilaterally  []  Crackles or intermittent wheezes []  Wheezes []    Cough Strong, Spontan., & nonproductive [x]  Strong,  spontaneous, &  productive []  Weak,  Nonproductive []  Weak, productive or  with wheezes []  No spontaneous  cough or may require suctioning []    Level of Activity Ambulatory [x]  Ambulatory w/ Assist  []  Non-ambulatory []  Paraplegic []  Quadriplegic []    Total    Score:__4_____     Triage Score:_____5___      Tri       Triage:     1. (>20) Freq: Q3    2. (16-20) Freq: Q4   3. (11-15) Freq: QID & Albuterol Q2 PRN    4. (6-10) Freq: TID & Albuterol Q2 PRN    5. (0-5) Freq Q4prn

## 2015-01-27 NOTE — Progress Notes (Signed)
Group Therapy Note    Date: 01/27/2015  Start Time: 1100  End Time:  1145    Number of Participants:9    Type of Group: Psychoeducation    Patient's Goal:  To participate in activities group.    Notes:  Patient completed one task. Seemed to enjoy.    Status After Intervention:  Improved    Participation Level: Active Listener    Participation Quality: Appropriate      Speech:  normal      Thought Process/Content: Logical      Affective Functioning: Congruent      Mood: elevated      Level of consciousness:  Alert      Response to Learning: Able to verbalize current knowledge/experience      Endings: None Reported    Modes of Intervention: Education      Discipline Responsible: Social Worker/Counselor      Signature:  Ariyanna Oien M Ezrael Sam, LISW

## 2015-01-27 NOTE — Progress Notes (Signed)
Group Therapy Note    Date: 01/27/15  Start Time:0900  End Time:  0930    Number of Participants:12    Type of Group: Psychoeducation    Patient's Goal:  To participate in the morning meeting.    Notes: Patient declined to attend psychoeducation group at 0900 despite encouragement by staff.     Discipline Responsible: Social Worker/Counselor    Lucretia FieldAnita M Delawrence Fridman, LISW

## 2015-01-27 NOTE — Progress Notes (Signed)
Pt has been pleasant and social today denies feeling suicidal . Did become tearful when talking about her son who is dependant on her since his aneurysm . Pt states shes just beginning to sleep better at night since shes taking her latuda at hs . She feels her pain is managed only when she takes her muscle relaxant with her ultram .

## 2015-01-27 NOTE — Progress Notes (Signed)
Visible out on unit states mood is moore stable and denies any suicidal or homicidal ideations out on unit and social with peers.

## 2015-01-27 NOTE — Plan of Care (Signed)
Problem: Anger Management/Homicidal Ideation  Intervention: Education, Medication-behavioral health  Ongoing pt listens to gospel music i pray i watch tv and write to help me cope and calm my mind    Goal: LTG-Able to display appropriate communication and problem solving  Outcome: Met This Shift  Goal: STG-Knowledge of positive coping patterns  Outcome: Ongoing

## 2015-01-27 NOTE — Progress Notes (Signed)
Clinical Pharmacy Note    Alisha Mccall is a 49 y.o. female for whom pharmacy has been asked to manage warfarin therapy.    Reason for Admission: psych eval, bipolar non-compliant with meds    Consulting Physician: Alisha Mccall  Warfarin dose prior to admission: 7.5mg  Mon, Fri; 5mg  Sun, Tues, Wed, Thur, Sat (none since admission 01/23/15)  Warfarin Indication: hx of PE  Target INR range: 2-3  Order for concomitant anticoagulant therapy: bridging with Lovenox 1mg /kg BID    Outpatient warfarin provider: unknown    Past Medical History   Diagnosis Date   ??? CAD (coronary artery disease)    ??? Cardiac angina (HCC)    ??? CHF (congestive heart failure) (HCC)    ??? COPD (chronic obstructive pulmonary disease) (HCC)    ??? 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 GERD (gastroesophageal reflux disease)    ??? History of blood transfusion    ??? Hyperlipidemia    ??? Hypertension    ??? MI (myocardial infarction) (HCC)    ??? Neuromuscular disorder (HCC)    ??? Osteoarthritis    ??? Pneumonia    ??? Psychiatric problem    ??? Pulmonary embolism (HCC)    ??? Scoliosis                Recent Labs      01/26/15   0732   INR  1.1     No results for input(s): HGB, HCT, PLT in the last 72 hours.    Current warfarin drug-drug interactions: Trazodone, Tramadol    Date INR Warfarin Dose   01/23/15 3.2 none   01/26/15 1.1 5mg  x 1 12/11 0700                                Plan:    Daily PT/INR until stable within therapeutic range. Daily monitoring of INR to determine dose each day. Dr Alisha Mccall initiated one time 5mg  order 01/27/15 0700 and bridge with Lovenox until INR > 2.5. Pharmacy to continue dosing starting 01/28/15.    Thank you for the consult.    Alisha Mccall, R.Ph.  01/27/2015  7:13 AM

## 2015-01-28 ENCOUNTER — Inpatient Hospital Stay: Admit: 2015-01-28 | Payer: PRIVATE HEALTH INSURANCE | Primary: Family Medicine

## 2015-01-28 LAB — BASIC METABOLIC PANEL
Anion Gap: 8 mEq/L (ref 7–13)
BUN: 16 mg/dL (ref 6–20)
CO2: 24 mEq/L (ref 22–29)
Calcium: 8.8 mg/dL (ref 8.6–10.2)
Chloride: 105 mEq/L (ref 98–107)
Creatinine: 0.87 mg/dL (ref 0.50–0.90)
GFR African American: >60 (ref >60)
GFR Non-African American: >60 (ref >60)
Glucose: 116 mg/dL — ABNORMAL HIGH (ref 74–109)
Potassium: 4 mEq/L (ref 3.5–5.1)
Sodium: 137 mEq/L (ref 132–144)

## 2015-01-28 LAB — PROTIME-INR
INR: 1.1
Protime: 12 s (ref 8.1–13.7)

## 2015-01-28 LAB — CBC WITH AUTO DIFFERENTIAL
Basophils %: 1 %
Basophils Absolute: 0.1 10*3/uL (ref 0.0–0.2)
Eosinophils %: 2.6 %
Eosinophils Absolute: 0.2 10*3/uL (ref 0.0–0.7)
Hematocrit: 36.2 % — ABNORMAL LOW (ref 37.0–47.0)
Hemoglobin: 11.8 g/dL — ABNORMAL LOW (ref 12.0–16.0)
Lymphocytes %: 35.5 %
Lymphocytes Absolute: 2.3 10*3/uL (ref 1.0–4.8)
MCH: 31.1 pg (ref 27.0–31.3)
MCHC: 32.5 % — ABNORMAL LOW (ref 33.0–37.0)
MCV: 95.6 fL (ref 82.0–100.0)
Monocytes %: 7 %
Monocytes Absolute: 0.5 10*3/uL (ref 0.2–0.8)
Neutrophils %: 53.9 %
Neutrophils Absolute: 3.5 10*3/uL (ref 1.4–6.5)
Platelets: 266 10*3/uL (ref 130–400)
RBC: 3.79 M/uL — ABNORMAL LOW (ref 4.20–5.40)
RDW: 15.1 % — ABNORMAL HIGH (ref 11.5–14.5)
WBC: 6.5 10*3/uL (ref 4.8–10.8)

## 2015-01-28 MED ORDER — IPRATROPIUM-ALBUTEROL 20-100 MCG/ACT IN AERS
20-100 MCG/ACT | RESPIRATORY_TRACT | Status: DC
Start: 2015-01-28 — End: 2015-01-30
  Administered 2015-01-28 – 2015-01-30 (×6): 1 via RESPIRATORY_TRACT

## 2015-01-28 MED ORDER — NICOTINE 21 MG/24HR TD PT24
21 MG/24HR | Freq: Every day | TRANSDERMAL | Status: DC
Start: 2015-01-28 — End: 2015-01-30
  Administered 2015-01-28 – 2015-01-30 (×3): 1 via TRANSDERMAL

## 2015-01-28 MED ORDER — RIVAROXABAN 20 MG PO TABS
20 MG | Freq: Every day | ORAL | Status: DC
Start: 2015-01-28 — End: 2015-01-28

## 2015-01-28 MED ORDER — AMOXICILLIN-POT CLAVULANATE 875-125 MG PO TABS
875-125 MG | Freq: Two times a day (BID) | ORAL | Status: DC
Start: 2015-01-28 — End: 2015-01-30
  Administered 2015-01-29 – 2015-01-30 (×4): 1 via ORAL

## 2015-01-28 MED ORDER — PREDNISONE 20 MG PO TABS
20 MG | Freq: Two times a day (BID) | ORAL | Status: DC
Start: 2015-01-28 — End: 2015-01-30
  Administered 2015-01-28 – 2015-01-30 (×5): 40 mg via ORAL

## 2015-01-28 MED ORDER — IPRATROPIUM-ALBUTEROL 0.5-2.5 (3) MG/3ML IN SOLN
RESPIRATORY_TRACT | Status: DC
Start: 2015-01-28 — End: 2015-01-28

## 2015-01-28 MED ORDER — PREDNISONE 20 MG PO TABS
20 MG | Freq: Two times a day (BID) | ORAL | Status: DC
Start: 2015-01-28 — End: 2015-01-30

## 2015-01-28 MED ORDER — PREDNISONE 10 MG PO TABS
10 MG | Freq: Two times a day (BID) | ORAL | Status: DC
Start: 2015-01-28 — End: 2015-01-30

## 2015-01-28 MED ORDER — PREDNISONE 10 MG PO TABS
10 MG | Freq: Every day | ORAL | Status: DC
Start: 2015-01-28 — End: 2015-01-30

## 2015-01-28 MED ORDER — WARFARIN SODIUM 5 MG PO TABS
5 MG | Freq: Once | ORAL | Status: DC
Start: 2015-01-28 — End: 2015-01-28

## 2015-01-28 MED FILL — COMBIVENT RESPIMAT 20-100 MCG/ACT IN AERS: 20-100 MCG/ACT | RESPIRATORY_TRACT | Qty: 200

## 2015-01-28 MED FILL — IPRATROPIUM-ALBUTEROL 0.5-2.5 (3) MG/3ML IN SOLN: RESPIRATORY_TRACT | Qty: 3

## 2015-01-28 MED FILL — PREDNISONE 20 MG PO TABS: 20 MG | ORAL | Qty: 2

## 2015-01-28 MED FILL — TRAMADOL HCL 50 MG PO TABS: 50 MG | ORAL | Qty: 1

## 2015-01-28 MED FILL — NICORELIEF 4 MG MT GUM: 4 MG | OROMUCOSAL | Qty: 1

## 2015-01-28 MED FILL — LATUDA 40 MG PO TABS: 40 MG | ORAL | Qty: 3

## 2015-01-28 MED FILL — GABAPENTIN 300 MG PO CAPS: 300 MG | ORAL | Qty: 2

## 2015-01-28 MED FILL — XARELTO 20 MG PO TABS: 20 MG | ORAL | Qty: 1

## 2015-01-28 MED FILL — PANTOPRAZOLE SODIUM 40 MG PO TBEC: 40 MG | ORAL | Qty: 1

## 2015-01-28 MED FILL — LOVENOX 80 MG/0.8ML SC SOLN: 80 MG/0.8ML | SUBCUTANEOUS | Qty: 0.8

## 2015-01-28 MED FILL — LITHIUM CARBONATE 300 MG PO CAPS: 300 MG | ORAL | Qty: 1

## 2015-01-28 MED FILL — METOPROLOL TARTRATE 25 MG PO TABS: 25 MG | ORAL | Qty: 1

## 2015-01-28 MED FILL — TIZANIDINE HCL 4 MG PO TABS: 4 MG | ORAL | Qty: 1

## 2015-01-28 MED FILL — LIDOCAINE 5 % EX PTCH: 5 % | CUTANEOUS | Qty: 2

## 2015-01-28 MED FILL — CARVEDILOL 6.25 MG PO TABS: 6.25 MG | ORAL | Qty: 1

## 2015-01-28 MED FILL — NICOTINE 21 MG/24HR TD PT24: 21 MG/24HR | TRANSDERMAL | Qty: 1

## 2015-01-28 NOTE — Progress Notes (Signed)
BEHAVIORAL HEALTH FOLLOW-UP NOTE I    01/28/2015    [x]  Patient was seen and examined in person  [x]  Chart reviewed  [x]  Labs reviewed  [x]  Patient's case discussed with staff/team    Chief Complaint: Still labile  Interim History: Pain is controllable with addition of ultram and flexiril. No longer suicidal, mood is stabilizing .No longer suicidal. Was tearful when talking about her son who is dependent on her since his aneurysm.Needs a little more stabilization. Lithium level tomorrow.   Appetite:  [x]  Normal/Unchanged  []  Increased  []  Decreased  SI []  Present  []  Absent    Sleep:       []  Normal/Unchanged  [x]  Fair       []  Poor          HI  [] Present  [x]  Absent    Energy:    [x]  Normal/Unchanged  []  Increased  []  Decreased   Plan []  Present                          []  Absent  []  N/A    Patient is [x]  able  []  unable to CONTRACT FOR SAFETY   Aggression:  []  yes  [x]  no  Medication side effects(SE):  [x]  None(Psych. Meds.) []  Other  Well tolerated: [x]  yes  []  no    Examination:  Visit Vitals   ??? BP 131/70   ??? Pulse 75   ??? Temp 98 ??F (36.7 ??C)   ??? Resp 20   ??? Ht 5\' 4"  (1.626 m)   ??? Wt 172 lb (78 kg)   ??? SpO2 99%   ??? BMI 29.52 kg/m2       Appearance: [x]  casual  []  anxious  []  confused  []  blunted  []  silly  []  poor hygiene  []  poor grooming  Gait:  [x]  stable  []  unstable  []  limping  []  shuffling  []  in wheel chair or other support    Mental Status:   Orientation: Date/Time: [x]  yes  []  no Place: [x]  yes  []  no     Person: [x]  yes  []  no  Level of Consciousness: [x]  alert  []  drowsy  []  tired  []  lethargic  []  distractable  []  asleep  []  could not be assessed    Manner:   []  Cooperative  []  Guarded  []  Suspicious  []  Irritable  []  Hostile  []  Withdrawn  []  Other    Motor Activity:   [x]  Normal  []  Agitation  []  Motor retardation  []  Tremor  []  Other    Musculoskeletal:   [x]  Normal  []  Rigidity  []  Cogwheel  []  Flaccid  []  Tics/TD  []  Other    Speech:   [x]  Normal  []  Soft/Loud  []  Slow/Pressured  []  Dysarthria   []  Incoherent  []  Other    Language:  [x]  Normal  []  Expressive Aphasia  []  Fluent Aphasia  []  Other    Mood:  []  Euthymic  [x]  Depressed  []  Irritable  []  Angry  []  Anxious  []  Fearful  []  Apathetic  []  Euphoric  []  Other    Affect:  []  Euthymic  []  Depressed  []  Blunted  []  Flat  []  Irritable  []  Angry  [x]  Anxious    []  Labile  []  Expansive  []  Exaggerated  []  Other    Thought Process/Association:   [x]  Normal  []  Tangential  []  Circumstantial  []  Poverty of  Thought   Concrete   Disorganized   Racing Thoughts   Flight of Ideas   Loose   Other    Thought Contents:    Hopelessness   Worthlessness   Hypochondriasis   Delusions   Paranoia   Ruminations   Obsessions/Compulsions   Confused  Hopeful    Future Oriented  Other    Perception:   Normal   Hallucinations   Auditory   Visual   Olfactory   Tactile     Dissociation   Flashbacks   Other    Attention/Concentration:   Intact   Poor   Distractible   Other    Cognition:   Intact   Impaired   Insight:  Intact   Fair   Limited   Short Term Memory:   Intact   Impaired   Poor  Judgement:   Intact   Fair   Limited  Remote Memory:   Intact   Impaired   Poor  MMSE Score:        BEHAVIORAL HEALTH FOLLOW-UP NOTE II    01/28/2015    PAST MEDICAL/PSYCHIATRIC HISTORY:   Past Medical History   Diagnosis Date   ??? CAD (coronary artery disease)    ??? Cardiac angina (HCC)    ??? CHF (congestive heart failure) (HCC)    ??? COPD (chronic obstructive pulmonary disease) (HCC)    ??? 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 GERD (gastroesophageal reflux disease)    ??? History of blood transfusion    ??? Hyperlipidemia    ??? Hypertension    ??? MI (myocardial infarction) (HCC)    ??? Neuromuscular disorder (HCC)    ??? Osteoarthritis    ??? Pneumonia    ??? Psychiatric problem    ??? Pulmonary embolism (HCC)    ??? Scoliosis        FAMILY/SOCIAL HISTORY:  Family History   Problem Relation Age of Onset   ??? Cirrhosis  Mother    ??? Other Mother      Hepatitis B   ??? High Blood Pressure Father    ??? Cancer Maternal Grandmother    ??? Arthritis Maternal Grandmother    ??? Cancer Paternal Grandmother    ??? Scoliosis Paternal Grandmother    ??? High Blood Pressure Paternal Grandfather      Social History     Social History   ??? Marital status: Widowed     Spouse name: N/A   ??? Number of children: N/A   ??? Years of education: N/A     Occupational History   ??? Not on file.     Social History Main Topics   ??? Smoking status: Current Every Day Smoker     Packs/day: 1.50     Years: 30.00   ??? Smokeless tobacco: Not on file   ??? Alcohol use No   ??? Drug use: No   ??? Sexual activity: Not Currently     Other Topics Concern   ??? Not on file     Social History Narrative       LABS:  Lab Results   Component Value Date    LITHIUM 0.2 (L) 01/24/2015    NA 137 01/28/2015    BUN 16 01/28/2015    CREATININE 0.87 01/28/2015    TSH 1.070 01/23/2015    WBC 6.5 01/28/2015     No results found for: PHENYTOIN, PHENOBARB, VALPROATE, CBMZ  Lab Results   Component Value Date  INR 1.1 01/28/2015    PROTIME 12.0 01/28/2015     Lab Results   Component Value Date    APTT 37.4 (H) 01/23/2015     No results for input(s): ETOH in the last 72 hours.  @FLRESURINE @      ROS:  [x]  All negative/unchanged except if checked. Explain positive(checked items) below:  []  Constitutional  []  Eyes  []  Ear/Nose/Mouth/Throat  []  Respiratory  []  CV  []  GI  []  GU  []  Musculoskeletal  []  Skin/Breast  []  Neurological  []  Endocrine  []  Heme/Lymph  []  Allergic/Immunologic    Explanation:     MEDICATIONS:    Current Facility-Administered Medications:   ???  predniSONE (DELTASONE) tablet 40 mg, 40 mg, Oral, BID, 40 mg at 01/28/15 1258 **FOLLOWED BY** [START ON 01/31/2015] predniSONE (DELTASONE) tablet 20 mg, 20 mg, Oral, BID **FOLLOWED BY** [START ON 02/03/2015] predniSONE (DELTASONE) tablet 10 mg, 10 mg, Oral, BID **FOLLOWED BY** [START ON 02/06/2015] predniSONE (DELTASONE) tablet 10 mg, 10 mg, Oral, Daily,  Tiffany T Brown, PA-C  ???  amoxicillin-clavulanate (AUGMENTIN) 875-125 MG per tablet 1 tablet, 1 tablet, Oral, 2 times per day, Tiffany T Brown, PA-C  ???  nicotine (NICODERM CQ) 21 MG/24HR 1 patch, 1 patch, Transdermal, Daily, Josph MachoMuhammad N Danamarie Minami, MD, 1 patch at 01/28/15 1419  ???  albuterol-ipratropium (COMBIVENT RESPIMAT) 20-100 MCG/ACT inhaler 1 puff, 1 puff, Inhalation, Q4H While awake, Clayborne ArtistJohn Michael Dobritch, PA, 1 puff at 01/28/15 1502  ???  rivaroxaban (XARELTO) tablet 20 mg, 20 mg, Oral, Daily, Tiffany T Brown, PA-C  ???  albuterol-ipratropium (COMBIVENT RESPIMAT) 20-100 MCG/ACT inhaler 1 puff, 1 puff, Inhalation, Q4H PRN, Gillermina HuBalaji Saravanan, MD  ???  lithium capsule 300 mg, 300 mg, Oral, BID, Josph MachoMuhammad N Dailey Buccheri, MD, 300 mg at 01/28/15 0955  ???  metoprolol tartrate (LOPRESSOR) tablet 25 mg, 25 mg, Oral, BID, Josph MachoMuhammad N Tamani Durney, MD, 25 mg at 01/28/15 [id number]  ???  carvedilol (COREG) tablet 3.125 mg, 3.125 mg, Oral, BID WC, Josph MachoMuhammad N Maille Halliwell, MD, 3.125 mg at 01/28/15 1729  ???  lurasidone (LATUDA) tablet 120 mg, 120 mg, Oral, Nightly, Josph MachoMuhammad N Marquarius Lofton, MD, 120 mg at 01/28/15 1728  ???  traZODone (DESYREL) tablet 50 mg, 50 mg, Oral, Nightly, Josph MachoMuhammad N Retha Bither, MD, 50 mg at 01/27/15 2141  ???  tiZANidine (ZANAFLEX) tablet 4 mg, 4 mg, Oral, Q6H PRN, Earna CoderSameh R Yonan, MD, 4 mg at 01/28/15 1557  ???  lidocaine (LIDODERM) 5 % 2 patch, 2 patch, Transdermal, Daily, Earna CoderSameh R Yonan, MD, 2 patch at 01/27/15 2301  ???  traMADol (ULTRAM) tablet 50 mg, 50 mg, Oral, Q6H PRN, Earna CoderSameh R Yonan, MD, 50 mg at 01/28/15 1557  ???  gabapentin (NEURONTIN) capsule 600 mg, 600 mg, Oral, TID, Earna CoderSameh R Yonan, MD, 600 mg at 01/28/15 1348  ???  pantoprazole (PROTONIX) tablet 40 mg, 40 mg, Oral, QAM AC, Lauren O Portman, DO, 40 mg at 01/28/15 [id number]  ???  mometasone-formoterol (DULERA) 200-5 MCG/ACT inhaler 2 puff, 2 puff, Inhalation, BID, Josph MachoMuhammad N Lineth Thielke, MD, 2 puff at 01/28/15 [phone]  ???  acetaminophen (TYLENOL) tablet 650 mg, 650 mg, Oral, Q4H PRN, Earna CoderSameh R Yonan, MD, 650 mg at 01/26/15  1616  ???  magnesium hydroxide (MILK OF MAGNESIA) 400 MG/5ML suspension 30 mL, 30 mL, Oral, Daily PRN, Josph MachoMuhammad N Floyed Masoud, MD  ???  haloperidol lactate (HALDOL) injection 5 mg, 5 mg, Intramuscular, Q6H PRN, Josph MachoMuhammad N Keyandre Pileggi, MD  ???  haloperidol (HALDOL) tablet 5 mg, 5 mg, Oral, Q6H PRN, Jerolyn CenterMuhammad  Valente David, MD  ???  hydrOXYzine (VISTARIL) capsule 50 mg, 50 mg, Oral, Q6H PRN, Josph Macho, MD, 50 mg at 01/23/15 2313  ???  hydrOXYzine (VISTARIL) injection 50 mg, 50 mg, Intramuscular, Q6H PRN, Josph Macho, MD    PSYCHOTHERAPY/COUNSELING:   Therapeutic interview   Supportive   CBT   Ongoing   Other    ASSESSMENT:  Patient is:   Well controlled   Improving   Worsening   Other     Patient continues to need, on a daily basis, active treatment furnished directly by or     requiring the supervision of inpatient psychiatric personnel     Diagnosis:  Active Problems:    Bipolar 2 disorder (HCC)    Chronic pain syndrome    Long term current use of opiate analgesic    Cocaine abuse    Low back pain  Bipolar Type 2, Depressed    Treatment Plan:   Continue Current Medications   Continue Follow-up   Continue Labs  Continue Lithium to 300 mg BID  Lithium level on 01/29/15  Group  Milieu  Psycho-education  Collaterals     Risks, benefits, side effects, drug-to-drug interactions and alternatives to treatment were discussed in my usual manner.    Reason for more than one antipsychotic:   N/A   3 failed monotherapy(drugs tried):   Cross over to a new antipsychotic   Taper to monotherapy from polypharmacy   Augmentation of Clozapine therapy due to treatment resistance to single therapy      Electronically signed by Josph Macho, MD on 01/28/2015 at 5:58 PM

## 2015-01-28 NOTE — Progress Notes (Signed)
Group Therapy Note    Date: 01/28/2015  Start RUEA:[id number]  End Time: 1505    Number of Participants:6    Type of Group: Psychoeducation    Patient's Goal: To participate in mood management group.    Notes:  Patient learned about the cycle of low self-esteem.    Status After Intervention:  Improved    Participation Level: Active Listener    Participation Quality: Appropriate      Speech:  normal      Thought Process/Content: Logical      Affective Functioning: Congruent      Mood: elevated      Level of consciousness:  Alert      Response to Learning: Able to verbalize current knowledge/experience      Endings: None Reported    Modes of Intervention: Education      Discipline Responsible: Social Worker/Counselor      Signature:  Lucretia FieldAnita M Araceli Coufal, LISW

## 2015-01-28 NOTE — Care Coordination-Inpatient (Signed)
FAMILY COLLATERAL NOTE    Family/Support Name: "Helmut Musterlicia"  Contact #: [phone]  Relationship to Pt: Friend      Placed call to above.      Response:  Child psychotherapistocial Worker called the number provided for collateral informant. Informant was not available. A voice message was left by Child psychotherapistocial Worker requesting a return phone call.      Lucretia FieldAnita M Melbert Botelho, LISW

## 2015-01-28 NOTE — Plan of Care (Signed)
Problem: Anger Management/Homicidal Ideation  Goal: LTG-Able to display appropriate communication and problem solving  Outcome: Completed Date Met:  01/28/15  Goal: STG-Knowledge of positive coping patterns  Outcome: Completed Date Met:  01/28/15

## 2015-01-28 NOTE — Care Coordination-Inpatient (Signed)
FAMILY COLLATERAL NOTE    Family/Support Name: Fredderick Severancelicia Kimbrough [facility]([place] )  Contact [phone]#:[phone]  Relationship to Pt:: friend and spiritual advisor        Family/Support contact aware of hospitalization:yes  Presenting Symptoms/Current Concerns: Patient was sad. The holidays are here and she worries a lot. She has a lot of difficulty with her adult children.  Patient returned to old behaviors. Was not suicidal.      Top 3 Life Stressors: Patient has adult children that she has conflict with and they stay at patients home on and off. Patients son was in a car accident a month ago. Patient needed to show her children, "enough is enough."        Background History Relevant to Current Hospitalization: Patient is very spiritual, easy to talk to, fun to be around.  Patient is not insane.          Family Mental Health/Substance Use History: Mother who is deceased suffered from depression.        Support Network's Goal for Hospitalization: Continue to take medications- Pain can be high due to degenerative disc dx.     Discharge Plan: Stabilize on medications. [facility]      Support Network Supportive of Discharge Plan: yes      Support can confirm Energy managerafety of Location and Security of Weapons: No weapons in the home.      Support agreeable to Safeguard and Monitor Medications (including Prescription and OTC): Patient is able to manage her own medications.    Identified Barriers to Compliance with Discharge Plan: none known    Recommendations for Support Network: Patient is stable on medications  Before she is discharged.        Melven Sartoriusarlene  Ortiz, [facility]   Electronically signed by Melven Sartoriusarlene  Ortiz, LPCC on 01/28/2015 at 8:10 PM

## 2015-01-28 NOTE — Progress Notes (Signed)
INPATIENT PROGRESS NOTE    SERVICE DATE:  01/28/2015   SERVICE TIME:  1:07 PM      SUBJECTIVE    INTERVAL HPI: Pt reports recent URI and failure to complete abx previously prescribed.  She denies CP, palp, SOB, abd pain, NVFC but has productive cough.  Pt also has hx DVT/PE and INR is currently subtherapeutic.      MEDICATIONS:    Current Facility-Administered Medications   Medication Dose Route Frequency Provider Last Rate Last Dose   ??? predniSONE (DELTASONE) tablet 40 mg  40 mg Oral BID Philipp Ovensiffany T Laurine Kuyper, PA-C   40 mg at 01/28/15 1258    Followed by   ??? [START ON 01/31/2015] predniSONE (DELTASONE) tablet 20 mg  20 mg Oral BID Madex Seals T Briyan Kleven, PA-C        Followed by   ??? [START ON 02/03/2015] predniSONE (DELTASONE) tablet 10 mg  10 mg Oral BID Yael Coppess T Eligh Rybacki, PA-C        Followed by   ??? [START ON 02/06/2015] predniSONE (DELTASONE) tablet 10 mg  10 mg Oral Daily Magalie Almon T Shaiann Mcmanamon, PA-C       ??? ipratropium-albuterol (DUONEB) nebulizer solution 1 ampule  1 ampule Inhalation Q4H WA Megumi Treaster T Jamelle Noy, PA-C       ??? amoxicillin-clavulanate (AUGMENTIN) 875-125 MG per tablet 1 tablet  1 tablet Oral 2 times per day Philipp Ovensiffany T Clois Treanor, PA-C       ??? warfarin (COUMADIN) tablet 10 mg  10 mg Oral Once Mallory A Hipsher, RPH       ??? enoxaparin (LOVENOX) injection 80 mg  1 mg/kg Subcutaneous BID Trenton Gammonobert L Thomas, MD   80 mg at 01/28/15 [id number]   ??? warfarin (COUMADIN) daily dosing (placeholder)   Other RX Placeholder Trenton Gammonobert L Thomas, MD       ??? albuterol-ipratropium (COMBIVENT RESPIMAT) 20-100 MCG/ACT inhaler 1 puff  1 puff Inhalation Q4H PRN Gillermina HuBalaji Saravanan, MD       ??? lithium capsule 300 mg  300 mg Oral BID Josph MachoMuhammad N Momen, MD   300 mg at 01/28/15 0955   ??? metoprolol tartrate (LOPRESSOR) tablet 25 mg  25 mg Oral BID Josph MachoMuhammad N Momen, MD   25 mg at 01/28/15 [id number]   ??? carvedilol (COREG) tablet 3.125 mg  3.125 mg Oral BID WC Josph MachoMuhammad N Momen, MD   3.125 mg at 01/28/15 [id number]   ??? lurasidone (LATUDA) tablet 120 mg  120 mg Oral Nightly Josph MachoMuhammad N  Momen, MD   120 mg at 01/27/15 1716   ??? traZODone (DESYREL) tablet 50 mg  50 mg Oral Nightly Josph MachoMuhammad N Momen, MD   50 mg at 01/27/15 2141   ??? tiZANidine (ZANAFLEX) tablet 4 mg  4 mg Oral Q6H PRN Earna CoderSameh R Yonan, MD   4 mg at 01/28/15 0943   ??? lidocaine (LIDODERM) 5 % 2 patch  2 patch Transdermal Daily Earna CoderSameh R Yonan, MD   2 patch at 01/27/15 2301   ??? traMADol (ULTRAM) tablet 50 mg  50 mg Oral Q6H PRN Earna CoderSameh R Yonan, MD   50 mg at 01/27/15 1717   ??? gabapentin (NEURONTIN) capsule 600 mg  600 mg Oral TID Earna CoderSameh R Yonan, MD   600 mg at 01/28/15 0943   ??? pantoprazole (PROTONIX) tablet 40 mg  40 mg Oral QAM AC Lauren O Portman, DO   40 mg at 01/28/15 [id number]   ??? mometasone-formoterol (DULERA) 200-5 MCG/ACT inhaler 2 puff  2 puff Inhalation BID Paul HalfMuhammad N  Momen, MD   2 puff at 01/28/15 [id number]   ??? acetaminophen (TYLENOL) tablet 650 mg  650 mg Oral Q4H PRN Earna Coder, MD   650 mg at 01/26/15 1616   ??? magnesium hydroxide (MILK OF MAGNESIA) 400 MG/5ML suspension 30 mL  30 mL Oral Daily PRN Josph Macho, MD       ??? haloperidol lactate (HALDOL) injection 5 mg  5 mg Intramuscular Q6H PRN Josph Macho, MD       ??? haloperidol (HALDOL) tablet 5 mg  5 mg Oral Q6H PRN Josph Macho, MD       ??? hydrOXYzine (VISTARIL) capsule 50 mg  50 mg Oral Q6H PRN Josph Macho, MD   50 mg at 01/23/15 2313   ??? hydrOXYzine (VISTARIL) injection 50 mg  50 mg Intramuscular Q6H PRN Josph Macho, MD       ??? nicotine polacrilex (NICORETTE) gum 4 mg  4 mg Oral PRN Clayborne Artist Dobritch, PA   4 mg at 01/27/15 1336       OBJECTIVE  PHYSICAL EXAM:   Visit Vitals   ??? BP 138/88   ??? Pulse 71   ??? Temp 98 ??F (36.7 ??C)   ??? Resp 18   ??? Ht  (1.626 m)   ??? Wt 172 lb (78 kg)   ??? SpO2 98%   ??? BMI 29.52 kg/m2     Body mass index is 29.52 kg/(m^2).  CONSTITUTIONAL:  awake, alert, cooperative, no apparent distress, and appears stated age  EYES:  Lids and lashes normal, pupils equal, round and reactive to light, extra ocular muscles intact, sclera clear,  conjunctiva normal  ENT:  normocepalic, without obvious abnormality, atraumatic, oral pharynx with moist mucus membranes  NECK:  supple, symmetrical, trachea midline and skin normal  LUNGS:  no increased work of breathing, good air exchange and rhonchi right base and left base  CARDIOVASCULAR:  normal apical pulses and normal S1 and S2  ABDOMEN:  No scars, normal bowel sounds, soft, non-distended, non-tender, no masses palpated, no hepatosplenomegally  MUSCULOSKELETAL:  there is no redness, warmth, or swelling of the joints  full range of motion noted  motor strength is 5 out of 5 all extremities bilaterally  NEUROLOGIC:  Mental Status Exam:  Level of Alertness:   awake  Orientation:   person, place, time  Cranial Nerves:  cranial nerves II-XII are grossly intact  SKIN:  normal skin color, texture, turgor    DATA:   No results found.    Recent Results (from the past 24 hour(s))   PROTIME-INR    Collection Time: 01/28/15  6:05 AM   Result Value Ref Range    Protime 12.0 8.1 - 13.7 sec    INR 1.1        ASSESSMENT AND PLAN   1.  URI - present on admission  2.  Medication non-compliance  3.  Subtherapeutic INR    CXR now  Start augmentin, prednisone taper and aerosols  Bridge coumadin with lovenox bid - /kg until INR therapeutic  Consider starting xarelto in place of coumadin - will d/w consulting physician  Repeat cbc, bmp, daily INR      SIGNATURE: Philipp Ovens, PA-C PATIENT NAME: Alisha Mccall   DATE: January 28, 2015 MRN: [id number]   TIME: 1:07 PM PAGER: [phone]     Dr. Arlyce Dice, MD, Supervising Physician

## 2015-01-28 NOTE — Progress Notes (Signed)
Pt did not attend 1000hr Psychotherapy Group. Electronically signed by Perfecto KingdomNadine M Maleaha Hughett, LPCC on 01/28/2015 at 1:03 PM

## 2015-01-28 NOTE — Progress Notes (Signed)
Pt. refused to attend the 0900 community meeting due to resting in room, despite staff encouragement.Electronically signed by Tressie EllisMaria Terrell Shimko, RAC on 01/28/2015 at 9:45 AM

## 2015-01-28 NOTE — Plan of Care (Signed)
Problem: Anger Management/Homicidal Ideation  Goal: LTG-Able to display appropriate communication and problem solving  Outcome: Ongoing  Goal: STG-Knowledge of positive coping patterns  Outcome: Ongoing

## 2015-01-28 NOTE — Progress Notes (Signed)
Clinical Pharmacy Note    Warfarin consult follow-up    Recent Labs      01/28/15   0605   INR  1.1     No results for input(s): HGB, HCT, PLT in the last 72 hours.    Significant drug:drug interactions:  Lovenox 1 mg/kg subq BID until INR 2.5-3.5       Date INR Warfarin Dose   01/23/15 3.2 none   01/26/15 1.1 5mg  x 1 12/11 0700   01/28/15 1.1 10 mg                                      Notes:  Pt would normally take 7.5 mg today, so will boost with 10 mg today since pt is still baseline.    Daily PT/INR until stable within therapeutic range.     Alisha Mccall A. Josiyah Tozzi, PharmD  01/28/2015  12:30 PM

## 2015-01-28 NOTE — Progress Notes (Signed)
After lengthy discussion with patient, she has decided that she wants to remain on coumadin and discuss alternatives with her PCP and hematologist.  Rx for Xarelto discontinued.  Will admin 10mg  coumadin today and continue daily INR checks.

## 2015-01-28 NOTE — Progress Notes (Signed)
Pt denies all.  Wants to go home and check on her house and belongings, doesn't understand why we are still keeping her here.  Isolative to room, not feeling well at all, says woke up sweating and been coughing more.  Then tells this writer she should be on an antibiotic that she was taking before coming to hospital.  Pt has moist cough, crackles in bilateral lungs.  Flat, non reactive affect.  Hospitalist contacted for antibiotic restart and d/t pt lungs and cough.  Pt will be having chest xray today.      Electronically signed by Bryona Foxworthy Lyn Tayquan Gassman, RN on 01/28/2015 at 1:05 PM

## 2015-01-28 NOTE — Progress Notes (Signed)
Pt. refused to attend the 1100 skills group due to resting in room, despite staff encouragement.Electronically signed by Tressie EllisMaria Ravyn Nikkel, RAC on 01/28/2015 at 3:09 PM

## 2015-01-28 NOTE — Progress Notes (Signed)
Patient is visible on unit, social with peers. Preoccupied with medications. Reports depression and anxiety, though laughing and joking on the unit. Denies SI, HI, A/V hallucinations. Electronically signed by Adam PhenixSamantha R von Bauer, LPN on 96/05/5410/12/16 at 11:23 PM

## 2015-01-29 LAB — PROTIME-INR
INR: 1.1
Protime: 11.6 s (ref 8.1–13.7)

## 2015-01-29 LAB — LITHIUM LEVEL: Lithium Lvl: 0.4 mEq/L — ABNORMAL LOW (ref 0.6–1.2)

## 2015-01-29 MED ORDER — AMLODIPINE BESYLATE 5 MG PO TABS
5 MG | Freq: Every day | ORAL | Status: DC
Start: 2015-01-29 — End: 2015-01-30
  Administered 2015-01-29 – 2015-01-30 (×2): 10 mg via ORAL

## 2015-01-29 MED ORDER — WARFARIN SODIUM 5 MG PO TABS
5 MG | Freq: Once | ORAL | Status: AC
Start: 2015-01-29 — End: 2015-01-28
  Administered 2015-01-29: 02:00:00 10 mg via ORAL

## 2015-01-29 MED ORDER — GUAIFENESIN-DM 100-10 MG/5ML PO SYRP
100-10 MG/5ML | ORAL | Status: DC | PRN
Start: 2015-01-29 — End: 2015-01-30
  Administered 2015-01-29 – 2015-01-30 (×3): 5 mL via ORAL

## 2015-01-29 MED ORDER — WARFARIN DAILY DOSING BY PHARMACIST (PLACEHOLDER)
Status: DC
Start: 2015-01-29 — End: 2015-01-30

## 2015-01-29 MED ORDER — ENOXAPARIN SODIUM 80 MG/0.8ML SC SOLN
80 MG/0.8ML | Freq: Two times a day (BID) | SUBCUTANEOUS | Status: DC
Start: 2015-01-29 — End: 2015-01-30
  Administered 2015-01-29 – 2015-01-30 (×4): 80 mg/kg via SUBCUTANEOUS

## 2015-01-29 MED ORDER — GUAIFENESIN-DM 100-10 MG/5ML PO SYRP
100-10 MG/5ML | ORAL | Status: DC | PRN
Start: 2015-01-29 — End: 2015-01-29

## 2015-01-29 MED ORDER — TRAZODONE HCL 100 MG PO TABS
100 MG | Freq: Every evening | ORAL | Status: DC
Start: 2015-01-29 — End: 2015-01-30
  Administered 2015-01-30: 02:00:00 100 mg via ORAL

## 2015-01-29 MED ORDER — WARFARIN SODIUM 5 MG PO TABS
5 MG | Freq: Once | ORAL | Status: AC
Start: 2015-01-29 — End: 2015-01-29
  Administered 2015-01-29: 23:00:00 7.5 mg via ORAL

## 2015-01-29 MED FILL — COUMADIN 2.5 MG PO TABS: 2.5 MG | ORAL | Qty: 1

## 2015-01-29 MED FILL — AMOXICILLIN-POT CLAVULANATE 875-125 MG PO TABS: 875-125 MG | ORAL | Qty: 1

## 2015-01-29 MED FILL — GUAIFENESIN-DM 100-10 MG/5ML PO SYRP: 100-10 MG/5ML | ORAL | Qty: 10

## 2015-01-29 MED FILL — LOVENOX 80 MG/0.8ML SC SOLN: 80 MG/0.8ML | SUBCUTANEOUS | Qty: 0.8

## 2015-01-29 MED FILL — LITHIUM CARBONATE 300 MG PO CAPS: 300 MG | ORAL | Qty: 1

## 2015-01-29 MED FILL — HYDROXYZINE PAMOATE 50 MG PO CAPS: 50 MG | ORAL | Qty: 1

## 2015-01-29 MED FILL — NICOTINE 21 MG/24HR TD PT24: 21 MG/24HR | TRANSDERMAL | Qty: 1

## 2015-01-29 MED FILL — AMLODIPINE BESYLATE 5 MG PO TABS: 5 MG | ORAL | Qty: 2

## 2015-01-29 MED FILL — LATUDA 40 MG PO TABS: 40 MG | ORAL | Qty: 3

## 2015-01-29 MED FILL — CARVEDILOL 6.25 MG PO TABS: 6.25 MG | ORAL | Qty: 1

## 2015-01-29 MED FILL — TIZANIDINE HCL 4 MG PO TABS: 4 MG | ORAL | Qty: 1

## 2015-01-29 MED FILL — TRAMADOL HCL 50 MG PO TABS: 50 MG | ORAL | Qty: 1

## 2015-01-29 MED FILL — PREDNISONE 20 MG PO TABS: 20 MG | ORAL | Qty: 2

## 2015-01-29 MED FILL — METOPROLOL TARTRATE 25 MG PO TABS: 25 MG | ORAL | Qty: 1

## 2015-01-29 MED FILL — TRAZODONE HCL 50 MG PO TABS: 50 MG | ORAL | Qty: 1

## 2015-01-29 MED FILL — GABAPENTIN 300 MG PO CAPS: 300 MG | ORAL | Qty: 2

## 2015-01-29 MED FILL — COUMADIN 5 MG PO TABS: 5 MG | ORAL | Qty: 2

## 2015-01-29 NOTE — Progress Notes (Signed)
Pt. attended the 0900 community meeting.Electronically signed by Tressie EllisMaria Bana Borgmeyer, RAC on 01/29/2015 at 9:46 AM

## 2015-01-29 NOTE — Progress Notes (Signed)
Group Therapy Note    Date: 11/22/2014  Start Time: 10:00 AM  End Time:  10:45 AM    Number of Participants: 9    Type of Group: Psychotherapy      Patient's Goal:  Patient's goal is to continue to resolve issues with her adult children.    Notes:  Patient is feeling at peace due to talking with her son over issues that were there prior to her admission.    Status After Intervention:  Improved    Participation Level: Active Listener and Interactive    Participation Quality: Appropriate, Attentive and Sharing      Speech:  normal      Thought Process/Content: Logical      Affective Functioning: Congruent      Mood: anxious      Level of consciousness:  Alert and Oriented x4      Response to Learning: Able to verbalize/acknowledge new learning      Endings: None Reported    Modes of Intervention: Education, Support and Socialization      Discipline Responsible: Social Worker/Counselor      Signature:  Canyon Willow A. Benna DunksBarber, LISW-S

## 2015-01-29 NOTE — Progress Notes (Signed)
Clinical Pharmacy Note    Warfarin consult follow-up    Consulting Physician: Lenor Derrickiffany Brown PA  Warfarin dose prior to admission: 7.5mg  Mon, Fri; 5mg  Sun, Tues, Wed, [place], Sat (none since admission 01/23/15)  Warfarin Indication: hx of PE  Target INR range: 2-3  Order for concomitant anticoagulant therapy: bridging with Lovenox 1mg /kg BID    Date INR Warfarin Dose   01/23/15 3.2 none   01/27/15 1.1 5mg     01/28/15  1.1  10 mg    01/29/15  1.1  7.5 mg                               Recent Labs      01/28/15   1241   HGB  11.8*   HCT  36.2*   PLT  266       Significant drug:drug interactions:  Warfarin drug-drug interactions:      Started 12/8 Tramadol, Trazodone     Starged 12/12 Lovenox 1 mg/kg subq BID, Augmentin, Prednisone  Discontinued drug-drug interactions:      Stopped 12/7 Aspirin, Atorvastatin    Notes:  INR subtherapeutic at 1.1, indication PE, range 2-3.  Patient was slightly supratherapeutic on admission with INR of 3.2 reflective of 40 mg TWD.  Per home regimen, patient due to receive 5 mg today.  Will give extra 2.5 mg for total of 7.5 mg today.  Continue bridge therapy with lovenox as prescribed.  Daily PT/INR until stable within therapeutic range.    Dannial MonarchMichelle Broly Hatfield, PharmD 01/29/2015 12:41 PM

## 2015-01-29 NOTE — Progress Notes (Signed)
Clinical Pharmacy Note    Warfarin consult follow-up.     Recent Labs      01/28/15   0605   INR  1.1     Recent Labs      01/28/15   1241   HGB  11.8*   HCT  36.2*   PLT  266           Notes:  Lenor Derrickiffany Brown PA-C per note on 01/28/15, Pt to receive Warfarin 10 mg 01-28-15. Pt wishes NOT to switch to Xarelto, wishes to remain on Warfarin at this time. Pharmacy to dose.    Daily PT/INR until stable within therapeutic range.        Clinical Pharmacy Note    Alisha Mccall is a 49 y.o. female for whom pharmacy has been asked to manage warfarin therapy.    Reason for Admission: psych eval, bipolar non-compliant with meds    Consulting Physician: Lenor Derrickiffany Brown PA  Warfarin dose prior to admission: 7.5mg  Mon, Fri; 5mg  Sun, Tues, Wed, Thur, Sat (none since admission 01/23/15)  Warfarin Indication: hx of PE  Target INR range: 2-3  Order for concomitant anticoagulant therapy: bridging with Lovenox 1mg /kg BID    Outpatient warfarin provider: unknown    Past Medical History   Diagnosis Date   ??? CAD (coronary artery disease)    ??? Cardiac angina (HCC)    ??? CHF (congestive heart failure) (HCC)    ??? COPD (chronic obstructive pulmonary disease) (HCC)    ??? 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 GERD (gastroesophageal reflux disease)    ??? History of blood transfusion    ??? Hyperlipidemia    ??? Hypertension    ??? MI (myocardial infarction) (HCC)    ??? Neuromuscular disorder (HCC)    ??? Osteoarthritis    ??? Pneumonia    ??? Psychiatric problem    ??? Pulmonary embolism (HCC)    ??? Scoliosis                    Current warfarin drug-drug interactions: Trazodone, Tramadol    Date INR Warfarin Dose   01/23/15 3.2 none   01/26/15 1.1 5mg  x 1 12/11 0700                                Plan:    Daily PT/INR until stable within therapeutic range. Daily monitoring of INR to determine dose each day. Lenor Derrickiffany Brown PA initiated one time 10 mg order 01/28/15 1900 and bridge with Lovenox until INR > 2.5. Pharmacy to continue dosing starting  01/29/15.    Thank you for the consult.    Gloriajean DellLaura Trinette Vera RPh  Staff Pharmacist  01/29/2015  3:06 AM

## 2015-01-29 NOTE — Progress Notes (Signed)
Per Dr. Maisie Fushomas, give AM dose of Coreg now. Will continue to monitor.

## 2015-01-29 NOTE — Progress Notes (Signed)
Group Therapy Note    Date:01/29/15  Start Time: 1435  End Time:  1505    Number of Participants: 7    Type of Group: Psychoeducation    Patient's Goal:  To participate in mood management group.    Notes: Patient declined to attend psychoeducation group [phone] despite encouragement by staff.     Discipline Responsible: Social Worker/Counselor    Alisha FieldAnita M Miller [place], LISW

## 2015-01-29 NOTE — Progress Notes (Deleted)
Clinical Pharmacy Note    Warfarin consult follow-up    Recent Labs      01/29/15   0636   INR  1.1     Recent Labs      01/28/15   1241   HGB  11.8*   HCT  36.2*   PLT  266       Significant drug:drug interactions:  Trazodone, tramadol, Prednisone    Notes:  Date INR Warfarin Dose   01/23/15 3.2 none   01/27/15 1.1 (12/10) 5mg     01/28/15 1.1 10 mg   01/29/15 1.1 10 mg                              Patient INR still at 1.1  Will give 10 mg again tonight    Daily PT/INR until stable within therapeutic range.      Thank you for the consult, Will continue to follow    Lawanna KobusJoshua Wil Slape, PharmD  PGY1 Pharmacy Resident  01/29/2015 11:24 AM

## 2015-01-29 NOTE — Progress Notes (Signed)
Pt reports not sleeping at all last night. Out in day room most of day, attended groups and ate well at breakfast and lunch. Alert and oriented x3. Denies SI, HI, AV hallucinations. Reports mood as "great" and "cool." pt is looking forward to going home tomorrow and see wants to spend time with her 49 year old son. Smiles and is pleasant. Sarcastic at times.

## 2015-01-29 NOTE — Behavioral Health Treatment Team (Signed)
Group Therapy Note    Date: 01/29/2015  Start Time: 1100  End Time:  1145  Number of Participants: 9    Type of Group: Psychoeducation    Wellness Binder Information  Module Name:    Session Number:      Patient's Goal:  "Care about me more."    Notes:  Patient was more animated and overall participation was good.    Status After Intervention:  Improved    Participation Level: Active Listener    Participation Quality: Appropriate and Attentive      Speech:  normal      Thought Process/Content: Linear      Affective Functioning: Congruent      Mood: Improved      Level of consciousness:  Alert, Oriented x4 and Attentive      Response to Learning: Able to verbalize current knowledge/experience and Progressing to goal      Endings: None Reported    Modes of Intervention: Education, Socialization and Activity      Discipline Responsible: Psychoeducational Specialist      Signature:  Tressie EllisMaria Elihue Ebert
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
## 2015-01-29 NOTE — Behavioral Health Treatment Team (Signed)
Group Therapy Note    Date: 01/29/2015  Start Time: 2000  End Time:  2030  Number of Participants: 14    Type of Group: Wrap-Up    Patient's Goal:  "To be positive with son"    Notes:  Patient reports having a good conversation with her son on the phone earlier today.    Status After Intervention:  Improved    Participation Level: Active Listener and Interactive    Participation Quality: Appropriate, Attentive and Sharing      Speech:  normal      Thought Process/Content: Logical      Affective Functioning: Congruent      Mood: euthymic      Level of consciousness:  Alert and Oriented x4      Response to Learning: Progressing to goal      Endings: None Reported    Modes of Intervention: Support and Socialization      Discipline Responsible: Stage managerBehavorial Health Tech      Electronically signed by Emi HolesSarah  Amarrion Pastorino on 01/29/2015 at 11:28 PM

## 2015-01-29 NOTE — Progress Notes (Signed)
Pt came to nurse's station with complaints of chest pain and palpitations. BP 186/95 HR51 RR20 PO2 100%. Hospitalist paged, EKG performed, pt tolerated well.

## 2015-01-29 NOTE — Progress Notes (Signed)
BEHAVIORAL HEALTH FOLLOW-UP NOTE I    01/29/2015    [x]  Patient was seen and examined in person  [x]  Chart reviewed  [x]  Labs reviewed  [x]  Patient's case discussed with staff/team    Chief Complaint: "I am feeling ok."    Interim History: Pain is controllable with addition of ultram and flexiril. No longer suicidal, mood is more stable .Less labile affect. Complains of difficulty sleeping last night will increase Trazodone.     Appetite:  [x]  Normal/Unchanged  []  Increased  []  Decreased  SI []  Present  []  Absent    Sleep:       []  Normal/Unchanged  [x]  Fair       []  Poor          HI  [] Present  [x]  Absent    Energy:    [x]  Normal/Unchanged  []  Increased  []  Decreased   Plan []  Present                          []  Absent  []  N/A    Patient is [x]  able  []  unable to CONTRACT FOR SAFETY   Aggression:  []  yes  [x]  no  Medication side effects(SE):  [x]  None(Psych. Meds.) []  Other  Well tolerated: [x]  yes  []  no    Examination:  Visit Vitals   ??? BP 132/79   ??? Pulse 70   ??? Temp 97 ??F (36.1 ??C) (Oral)   ??? Resp 18   ??? Ht 5\' 4"  (1.626 m)   ??? Wt 172 lb (78 kg)   ??? SpO2 100%   ??? BMI 29.52 kg/m2       Appearance: [x]  casual  []  anxious  []  confused  []  blunted  []  silly  []  poor hygiene  []  poor grooming  Gait:  [x]  stable  []  unstable  []  limping  []  shuffling  []  in wheel chair or other support    Mental Status:   Orientation: Date/Time: [x]  yes  []  no Place: [x]  yes  []  no     Person: [x]  yes  []  no  Level of Consciousness: [x]  alert  []  drowsy  []  tired  []  lethargic  []  distractable  []  asleep  []  could not be assessed    Manner:   []  Cooperative  []  Guarded  []  Suspicious  []  Irritable  []  Hostile  []  Withdrawn  []  Other    Motor Activity:   [x]  Normal  []  Agitation  []  Motor retardation  []  Tremor  []  Other    Musculoskeletal:   [x]  Normal  []  Rigidity  []  Cogwheel  []  Flaccid  []  Tics/TD  []  Other    Speech:   [x]  Normal  []  Soft/Loud  []  Slow/Pressured  []  Dysarthria  []  Incoherent  []  Other    Language:  [x]  Normal  []   Expressive Aphasia  []  Fluent Aphasia  []  Other    Mood:  []  Euthymic  [x]  Depressed  []  Irritable  []  Angry  []  Anxious  []  Fearful  []  Apathetic  []  Euphoric  []  Other    Affect:  []  Euthymic  []  Depressed  []  Blunted  []  Flat  []  Irritable  []  Angry  [x]  Anxious    []  Labile  []  Expansive  []  Exaggerated  []  Other    Thought Process/Association:   [x]  Normal  []  Tangential  []  Circumstantial  []  Poverty of Thought  []  Concrete  []   Disorganized   Racing Thoughts   Flight of Ideas   Loose   Other    Thought Contents:    Hopelessness   Worthlessness   Hypochondriasis   Delusions   Paranoia   Ruminations   Obsessions/Compulsions   Confused  Hopeful    Future Oriented  Other    Perception:   Normal   Hallucinations   Auditory   Visual   Olfactory   Tactile     Dissociation   Flashbacks   Other    Attention/Concentration:   Intact   Poor   Distractible   Other    Cognition:   Intact   Impaired   Insight:  Intact   Fair   Limited   Short Term Memory:   Intact   Impaired   Poor  Judgement:   Intact   Fair   Limited  Remote Memory:   Intact   Impaired   Poor  MMSE Score:        BEHAVIORAL HEALTH FOLLOW-UP NOTE II    01/29/2015    PAST MEDICAL/PSYCHIATRIC HISTORY:   Past Medical History   Diagnosis Date   ??? CAD (coronary artery disease)    ??? Cardiac angina (HCC)    ??? CHF (congestive heart failure) (HCC)    ??? COPD (chronic obstructive pulmonary disease) (HCC)    ??? 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 GERD (gastroesophageal reflux disease)    ??? History of blood transfusion    ??? Hyperlipidemia    ??? Hypertension    ??? MI (myocardial infarction) (HCC)    ??? Neuromuscular disorder (HCC)    ??? Osteoarthritis    ??? Pneumonia    ??? Psychiatric problem    ??? Pulmonary embolism (HCC)    ??? Scoliosis        FAMILY/SOCIAL HISTORY:  Family History   Problem Relation Age of Onset   ??? Cirrhosis Mother    ??? Other Mother      Hepatitis B   ??? High  Blood Pressure Father    ??? Cancer Maternal Grandmother    ??? Arthritis Maternal Grandmother    ??? Cancer Paternal Grandmother    ??? Scoliosis Paternal Grandmother    ??? High Blood Pressure Paternal Grandfather      Social History     Social History   ??? Marital status: Widowed     Spouse name: N/A   ??? Number of children: N/A   ??? Years of education: N/A     Occupational History   ??? Not on file.     Social History Main Topics   ??? Smoking status: Current Every Day Smoker     Packs/day: 1.50     Years: 30.00   ??? Smokeless tobacco: Not on file   ??? Alcohol use No   ??? Drug use: No   ??? Sexual activity: Not Currently     Other Topics Concern   ??? Not on file     Social History Narrative       LABS:  Lab Results   Component Value Date    LITHIUM 0.4 (L) 01/29/2015    NA 137 01/28/2015    BUN 16 01/28/2015    CREATININE 0.87 01/28/2015    TSH 1.070 01/23/2015    WBC 6.5 01/28/2015     No results found for: PHENYTOIN, PHENOBARB, VALPROATE, CBMZ  Lab Results   Component Value Date    INR 1.1 01/29/2015  PROTIME 11.6 01/29/2015     Lab Results   Component Value Date    APTT 37.4 (H) 01/23/2015     No results for input(s): ETOH in the last 72 hours.  @FLRESURINE @      ROS:  [x]  All negative/unchanged except if checked. Explain positive(checked items) below:  []  Constitutional  []  Eyes  []  Ear/Nose/Mouth/Throat  []  Respiratory  []  CV  []  GI  []  GU  []  Musculoskeletal  []  Skin/Breast  []  Neurological  []  Endocrine  []  Heme/Lymph  []  Allergic/Immunologic    Explanation:     MEDICATIONS:    Current Facility-Administered Medications:   ???  warfarin (COUMADIN) daily dosing (placeholder), , Other, RX Placeholder, Gloriajean DellLaura Ahrens, RPH  ???  guaiFENesin-dextromethorphan (ROBITUSSIN DM) 100-10 MG/5ML syrup 5 mL, 5 mL, Oral, Q4H PRN, Trenton Gammonobert L Thomas, MD, 5 mL at 01/29/15 1526  ???  warfarin (COUMADIN) tablet 7.5 mg, 7.5 mg, Oral, Once, Tiffany T Brown, PA-C  ???  amLODIPine (NORVASC) tablet 10 mg, 10 mg, Oral, Daily, Tiffany T Brown, PA-C, 10 mg at  01/29/15 1344  ???  predniSONE (DELTASONE) tablet 40 mg, 40 mg, Oral, BID, 40 mg at 01/29/15 0829 **FOLLOWED BY** [START ON 01/31/2015] predniSONE (DELTASONE) tablet 20 mg, 20 mg, Oral, BID **FOLLOWED BY** [START ON 02/03/2015] predniSONE (DELTASONE) tablet 10 mg, 10 mg, Oral, BID **FOLLOWED BY** [START ON 02/06/2015] predniSONE (DELTASONE) tablet 10 mg, 10 mg, Oral, Daily, Tiffany T Brown, PA-C  ???  amoxicillin-clavulanate (AUGMENTIN) 875-125 MG per tablet 1 tablet, 1 tablet, Oral, 2 times per day, Philipp Ovensiffany T Brown, PA-C, 1 tablet at 01/29/15 0830  ???  nicotine (NICODERM CQ) 21 MG/24HR 1 patch, 1 patch, Transdermal, Daily, Josph MachoMuhammad N Jillyn Stacey, MD, 1 patch at 01/29/15 [phone]  ???  albuterol-ipratropium (COMBIVENT RESPIMAT) 20-100 MCG/ACT inhaler 1 puff, 1 puff, Inhalation, Q4H While awake, Clayborne ArtistJohn Michael Dobritch, PA, 1 puff at 01/29/15 [id number]  ???  enoxaparin (LOVENOX) injection 80 mg, 1 mg/kg, Subcutaneous, BID, Philipp Ovensiffany T Brown, PA-C, 80 mg at 01/29/15 [id number]  ???  albuterol-ipratropium (COMBIVENT RESPIMAT) 20-100 MCG/ACT inhaler 1 puff, 1 puff, Inhalation, Q4H PRN, Gillermina HuBalaji Saravanan, MD  ???  lithium capsule 300 mg, 300 mg, Oral, BID, Josph MachoMuhammad N Yeslin Delio, MD, 300 mg at 01/29/15 0830  ???  metoprolol tartrate (LOPRESSOR) tablet 25 mg, 25 mg, Oral, BID, Josph MachoMuhammad N Rethel Sebek, MD, 25 mg at 01/29/15 0830  ???  lurasidone (LATUDA) tablet 120 mg, 120 mg, Oral, Nightly, Josph MachoMuhammad N Libni Fusaro, MD, 120 mg at 01/28/15 1728  ???  traZODone (DESYREL) tablet 50 mg, 50 mg, Oral, Nightly, Josph MachoMuhammad N Saraann Enneking, MD, 50 mg at 01/28/15 2049  ???  tiZANidine (ZANAFLEX) tablet 4 mg, 4 mg, Oral, Q6H PRN, Earna CoderSameh R Yonan, MD, 4 mg at 01/29/15 1526  ???  lidocaine (LIDODERM) 5 % 2 patch, 2 patch, Transdermal, Daily, Earna CoderSameh R Yonan, MD, 2 patch at 01/27/15 2301  ???  traMADol (ULTRAM) tablet 50 mg, 50 mg, Oral, Q6H PRN, Earna CoderSameh R Yonan, MD, 50 mg at 01/29/15 1526  ???  gabapentin (NEURONTIN) capsule 600 mg, 600 mg, Oral, TID, Earna CoderSameh R Yonan, MD, 600 mg at 01/29/15 1344  ???  pantoprazole (PROTONIX)  tablet 40 mg, 40 mg, Oral, QAM AC, Lauren O Portman, DO, 40 mg at 01/29/15 0534  ???  mometasone-formoterol (DULERA) 200-5 MCG/ACT inhaler 2 puff, 2 puff, Inhalation, BID, Josph MachoMuhammad N Akram Kissick, MD, 2 puff at 01/29/15 [id number]  ???  acetaminophen (TYLENOL) tablet 650 mg, 650 mg, Oral, Q4H PRN, Earna CoderSameh R Yonan, MD,  650 mg at 01/26/15 1616  ???  magnesium hydroxide (MILK OF MAGNESIA) 400 MG/5ML suspension 30 mL, 30 mL, Oral, Daily PRN, Josph Macho, MD  ???  haloperidol lactate (HALDOL) injection 5 mg, 5 mg, Intramuscular, Q6H PRN, Josph Macho, MD  ???  haloperidol (HALDOL) tablet 5 mg, 5 mg, Oral, Q6H PRN, Josph Macho, MD  ???  hydrOXYzine (VISTARIL) capsule 50 mg, 50 mg, Oral, Q6H PRN, Josph Macho, MD, 50 mg at 01/29/15 0645  ???  hydrOXYzine (VISTARIL) injection 50 mg, 50 mg, Intramuscular, Q6H PRN, Josph Macho, MD    PSYCHOTHERAPY/COUNSELING:   Therapeutic interview   Supportive   CBT   Ongoing   Other    ASSESSMENT:  Patient is:   Well controlled   Improving   Worsening   Other     Patient continues to need, on a daily basis, active treatment furnished directly by or     requiring the supervision of inpatient psychiatric personnel     Diagnosis:  Active Problems:    Bipolar 2 disorder (HCC)    Chronic pain syndrome    Long term current use of opiate analgesic    Cocaine abuse    Low back pain  Bipolar Type 2, Depressed    Treatment Plan:   Continue Current Medications   Continue Follow-up   Continue Labs  Continue Lithium to 300 mg BID  Increase trazodone to 100 mg nightly  Possible d/c tomorrow  Group  Milieu  Psycho-education  Collaterals     Risks, benefits, side effects, drug-to-drug interactions and alternatives to treatment were discussed in my usual manner.    Reason for more than one antipsychotic:   N/A   3 failed monotherapy(drugs tried):   Cross over to a new antipsychotic   Taper to monotherapy from polypharmacy   Augmentation of Clozapine therapy due to  treatment resistance to single therapy      Electronically signed by Josph Macho, MD on 01/29/2015 at 4:04 PM

## 2015-01-30 LAB — EKG 12-LEAD
Atrial Rate: 53 {beats}/min
P Axis: 68 degrees
P-R Interval: 168 ms
Q-T Interval: 422 ms
QRS Duration: 82 ms
QTc Calculation (Bazett): 395 ms
R Axis: 13 degrees
T Axis: 39 degrees
Ventricular Rate: 53 {beats}/min

## 2015-01-30 LAB — PROTIME-INR
INR: 1.4
Protime: 15.4 s — ABNORMAL HIGH (ref 8.1–13.7)

## 2015-01-30 MED ORDER — PREDNISONE 10 MG PO TABS
10 MG | ORAL_TABLET | Freq: Every day | ORAL | 0 refills | Status: AC
Start: 2015-01-30 — End: 2015-02-16

## 2015-01-30 MED ORDER — LIDOCAINE 5 % EX PTCH
5 % | MEDICATED_PATCH | Freq: Every day | CUTANEOUS | 0 refills | Status: DC
Start: 2015-01-30 — End: 2016-02-18

## 2015-01-30 MED ORDER — GABAPENTIN 300 MG PO CAPS
300 MG | ORAL_CAPSULE | Freq: Three times a day (TID) | ORAL | 0 refills | Status: DC
Start: 2015-01-30 — End: 2015-08-08

## 2015-01-30 MED ORDER — AMLODIPINE BESYLATE 10 MG PO TABS
10 MG | ORAL_TABLET | Freq: Every day | ORAL | 3 refills | Status: DC
Start: 2015-01-30 — End: 2015-08-08

## 2015-01-30 MED ORDER — LURASIDONE HCL 120 MG PO TABS
120 MG | ORAL_TABLET | Freq: Every evening | ORAL | 0 refills | Status: DC
Start: 2015-01-30 — End: 2015-08-08

## 2015-01-30 MED ORDER — WARFARIN SODIUM 2.5 MG PO TABS
2.5 MG | Freq: Once | ORAL | Status: AC
Start: 2015-01-30 — End: 2015-01-30
  Administered 2015-01-30: 05:00:00 2.5 mg via ORAL

## 2015-01-30 MED ORDER — IBUPROFEN 800 MG PO TABS
800 MG | ORAL_TABLET | Freq: Three times a day (TID) | ORAL | 0 refills | Status: DC | PRN
Start: 2015-01-30 — End: 2015-02-08

## 2015-01-30 MED ORDER — METOPROLOL TARTRATE 25 MG PO TABS
25 MG | ORAL_TABLET | Freq: Two times a day (BID) | ORAL | 3 refills | Status: DC
Start: 2015-01-30 — End: 2015-08-08

## 2015-01-30 MED ORDER — LITHIUM CARBONATE 300 MG PO CAPS
300 MG | ORAL_CAPSULE | Freq: Two times a day (BID) | ORAL | 3 refills | Status: DC
Start: 2015-01-30 — End: 2016-02-25

## 2015-01-30 MED ORDER — TRAZODONE HCL 100 MG PO TABS
100 MG | ORAL_TABLET | Freq: Every evening | ORAL | 0 refills | Status: DC
Start: 2015-01-30 — End: 2015-07-11

## 2015-01-30 MED ORDER — PREDNISONE 10 MG PO TABS
10 MG | ORAL_TABLET | Freq: Two times a day (BID) | ORAL | 0 refills | Status: AC
Start: 2015-01-30 — End: 2015-02-13

## 2015-01-30 MED ORDER — AMOXICILLIN-POT CLAVULANATE 875-125 MG PO TABS
875-125 MG | ORAL_TABLET | Freq: Two times a day (BID) | ORAL | 0 refills | Status: AC
Start: 2015-01-30 — End: 2015-02-03

## 2015-01-30 MED ORDER — NICOTINE 21 MG/24HR TD PT24
21 MG/24HR | MEDICATED_PATCH | Freq: Every day | TRANSDERMAL | 3 refills | Status: DC
Start: 2015-01-30 — End: 2015-07-11

## 2015-01-30 MED ORDER — PREDNISONE 20 MG PO TABS
20 MG | ORAL_TABLET | Freq: Two times a day (BID) | ORAL | 0 refills | Status: AC
Start: 2015-01-30 — End: 2015-02-08

## 2015-01-30 MED ORDER — PREDNISONE 20 MG PO TABS
20 MG | ORAL_TABLET | Freq: Two times a day (BID) | ORAL | 0 refills | Status: AC
Start: 2015-01-30 — End: 2015-02-10

## 2015-01-30 MED FILL — LOVENOX 80 MG/0.8ML SC SOLN: 80 MG/0.8ML | SUBCUTANEOUS | Qty: 0.8

## 2015-01-30 MED FILL — AMOXICILLIN-POT CLAVULANATE 875-125 MG PO TABS: 875-125 MG | ORAL | Qty: 1

## 2015-01-30 MED FILL — NICOTINE 21 MG/24HR TD PT24: 21 MG/24HR | TRANSDERMAL | Qty: 1

## 2015-01-30 MED FILL — LITHIUM CARBONATE 300 MG PO CAPS: 300 MG | ORAL | Qty: 1

## 2015-01-30 MED FILL — LIDOCAINE 5 % EX PTCH: 5 % | CUTANEOUS | Qty: 2

## 2015-01-30 MED FILL — TIZANIDINE HCL 4 MG PO TABS: 4 MG | ORAL | Qty: 1

## 2015-01-30 MED FILL — TRAZODONE HCL 100 MG PO TABS: 100 MG | ORAL | Qty: 1

## 2015-01-30 MED FILL — PREDNISONE 20 MG PO TABS: 20 MG | ORAL | Qty: 2

## 2015-01-30 MED FILL — AMLODIPINE BESYLATE 5 MG PO TABS: 5 MG | ORAL | Qty: 2

## 2015-01-30 MED FILL — COUMADIN 2.5 MG PO TABS: 2.5 MG | ORAL | Qty: 1

## 2015-01-30 MED FILL — TRAMADOL HCL 50 MG PO TABS: 50 MG | ORAL | Qty: 1

## 2015-01-30 MED FILL — METOPROLOL TARTRATE 25 MG PO TABS: 25 MG | ORAL | Qty: 1

## 2015-01-30 MED FILL — GABAPENTIN 300 MG PO CAPS: 300 MG | ORAL | Qty: 2

## 2015-01-30 MED FILL — PANTOPRAZOLE SODIUM 40 MG PO TBEC: 40 MG | ORAL | Qty: 1

## 2015-01-30 MED FILL — GUAIFENESIN-DM 100-10 MG/5ML PO SYRP: 100-10 MG/5ML | ORAL | Qty: 10

## 2015-01-30 MED FILL — HYDROXYZINE PAMOATE 25 MG PO CAPS: 25 MG | ORAL | Qty: 2
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
## 2015-01-30 NOTE — Discharge Instructions (Signed)
Good nutrition is important when healing from an illness, injury, or surgery. Follow any nutrition recommendations given to you during the hospital stay. If you are instructed to take an oral nutrition supplement at home, you can take it with meals, in-between meals, and/or before bedtime. These supplements can be purchased at most local grocery stores, pharmacies, and chain super-stores. If you need help in covering the cost of your medication or oral supplement, visit www.RxAssist.org. If you have any questions about your diet or nutrition, call the hospital and ask for the dietitian.       Your nutrition orders at the time of discharge were:  DIET GENERAL;.

## 2015-01-30 NOTE — Discharge Summary (Signed)
[facility]                           [address]  [place], [place]                                     DISCHARGE SUMMARY    PATIENT NAME:  Alisha Mccall, Alisha Mccall.              DOB:      [birth year]  MED REC NO:    [id number]                         ROOM:     C3WT V40981  ACCOUNT NO:    [id number]                        ADMISSION DATE: 01/23/2015  PHYSICIAN:     Josph Macho, MD             DISCHARGE DATE: 01/30/2015      Chief complaint, history of present illness, past psychiatric history, past  medical history, social history, legal history, surgical history, admission  assessment and initial plan, please refer to the psychiatric evaluation  dictated by Dr. Festus Barren on 01/24/2015.    HOSPITAL COURSE AND TREATMENT:  The patient was admitted here, given a  diagnosis of bipolar disorder type 2 with most recent episode depression.  She  was in the room most of the time.  She was very despondent.  She was very  angry about everything.  She is stressed out with her family situation with  the son and she said that she cannot do anything to her son because son is  only 72 years old and had a brain aneurysm and she feels bad about it.  The  patient stated that she will cooperative with the medication.  She was started  with her medication lithium, Latuda and gabapentin.  All this medication doses  were adjusted.  Latuda increased to 120 mg and moved from morning to at  bedtime.  The patient was also on gabapentin which the dose was optimized to  600 mg three times daily and also lithium to 300 mg twice a day.  Lithium  level was done which was below the levels.  However, the patient started  showing improvement.  She was also seen by Medicine and she is on steroid  tapering dosage and Coumadin.  Medical cleared her and then she said that she  is ready to go.  She is no more suicidal or homicidal.  Not psychotic.  She is  very pleasant.  She is going to different groups.  Her sleep  improved.  Last  night she slept from 9 to 3 a.m. and she said that this is her regular stuff  and then she is not suicidal or homicidal, not psychotic.  We reviewed  everything.  We thought she was very pleasant and she said she was ready to go  home.  This afternoon patient was discharged home.  She will continue to  follow up at [facility] where she has been following up for years.    MENTAL STATUS EXAM AT THE TIME OF DISCHARGE:  The patient was calm,  cooperative, forthcoming in revealing information.  Psychomotor revealed no  agitation, retardation or any other  abnormal movement.  Eye contact fair.  The  patient's speech was coherent, normal in rate, rhythm and tone.  Mood; "I am  fine, doctor".  Affect was pleasant, appreciated.  Thought process logical  without flights of ideas or looseness of association.  Thought contents are  devoid of auditory or visual hallucinations or any other perceptual  abnormalities.  She denies suicidal ideation, intent or any plan.  Also denies  homicidal ideation.  Insight and judgment fair. Impulse control adequate.   Cognitive function baseline.  She is alert and oriented to time, place and  person.    DISCHARGE DIAGNOSES:  1. Bipolar disorder type 2, most recent episode depression, but currently  acute phase of depression resolved.  2. Cocaine abuse.    PLANS AND RECOMMENDATIONS:  The [facility]  [facility] there.  Appointment has been made and it is in the discharge instructions.   The patient was also counseled and educated multiple times that if she  continues to abuse alcohol or cocaine or any other drugs she may act out  impulsively, resulting in serious harm to self or others.  She demonstrated  understanding and the self-responsibility of this risk taking behavior.    DISCHARGE MEDICATION:  Gabapentin 600 mg three times daily, lithium 300 mg  twice a day, Latuda 120 mg at bedtime.  The patient was advised to go and  check with the lithium level  from time to time with the outpatient provider.    CONDITION AT THE TIME OF DISCHARGE:  Stable.        Josph MachoMuhammad N Armella Stogner, MD      D: 01/30/2015 08:28:17  T: 01/30/2015 12:56:53  MM/lorain  Job#: [id number]  Doc#: [id number]

## 2015-01-30 NOTE — Progress Notes (Signed)
Denies suicidal/homicidal ideation, denies auditory/visual hallucinations  Reviewed and patient verbalized understanding of all d/c instructions  Belongings returned to patient  D/c with friend for transport home at this time

## 2015-01-30 NOTE — Discharge Instructions (Signed)
As tolerated

## 2015-01-30 NOTE — Progress Notes (Signed)
Pt. attended the 0900 community meeting.Electronically signed by Tressie EllisMaria Evana Runnels, RAC on 01/30/2015 at 9:39 AM

## 2015-01-30 NOTE — Discharge Summary (Signed)
#  [id number]

## 2015-02-08 ENCOUNTER — Emergency Department: Payer: PRIVATE HEALTH INSURANCE | Primary: Family Medicine

## 2015-02-08 ENCOUNTER — Emergency Department: Admit: 2015-02-08 | Discharge: 2015-02-08 | Payer: PRIVATE HEALTH INSURANCE | Primary: Family Medicine

## 2015-02-08 ENCOUNTER — Inpatient Hospital Stay
Admit: 2015-02-08 | Discharge: 2015-02-08 | Disposition: A | Discharge status: Home / Self Care | Payer: PRIVATE HEALTH INSURANCE

## 2015-02-08 DIAGNOSIS — S40011A Contusion of right shoulder, initial encounter: Secondary | ICD-10-CM

## 2015-02-08 MED ORDER — IBUPROFEN 800 MG PO TABS
800 MG | ORAL_TABLET | Freq: Three times a day (TID) | ORAL | 0 refills | Status: AC | PRN
Start: 2015-02-08 — End: ?

## 2015-02-08 MED ORDER — LIDO-CAPSAICIN-MEN-METHYL SAL 0.5-0.035-5-20 % EX PTCH
MEDICATED_PATCH | Freq: Two times a day (BID) | CUTANEOUS | 0 refills | Status: AC
Start: 2015-02-08 — End: 2015-02-13

## 2015-02-08 MED ORDER — CYCLOBENZAPRINE HCL 10 MG PO TABS
10 MG | ORAL_TABLET | Freq: Three times a day (TID) | ORAL | 0 refills | Status: DC | PRN
Start: 2015-02-08 — End: 2015-08-08

## 2015-02-08 MED ORDER — ORPHENADRINE CITRATE 30 MG/ML IJ SOLN
30 MG/ML | Freq: Once | INTRAMUSCULAR | Status: AC
Start: 2015-02-08 — End: 2015-02-08
  Administered 2015-02-08: 20:00:00 60 mg via INTRAMUSCULAR

## 2015-02-08 MED ORDER — KETOROLAC TROMETHAMINE 60 MG/2ML IM SOLN
60 MG/2ML | Freq: Once | INTRAMUSCULAR | Status: AC
Start: 2015-02-08 — End: 2015-02-08
  Administered 2015-02-08: 20:00:00 60 mg via INTRAMUSCULAR

## 2015-02-08 MED FILL — ORPHENADRINE CITRATE 30 MG/ML IJ SOLN: 30 MG/ML | INTRAMUSCULAR | Qty: 2

## 2015-02-08 MED FILL — KETOROLAC TROMETHAMINE 60 MG/2ML IM SOLN: 60 MG/2ML | INTRAMUSCULAR | Qty: 2
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
## 2015-02-08 NOTE — ED Triage Notes (Signed)
Pt c/o all over body pain due to fibromyalgia flaring up.

## 2015-02-27 ENCOUNTER — Encounter: Attending: Urology | Primary: Family Medicine

## 2015-03-28 ENCOUNTER — Inpatient Hospital Stay: Payer: PRIVATE HEALTH INSURANCE | Primary: Family Medicine

## 2015-04-05 ENCOUNTER — Inpatient Hospital Stay: Payer: PRIVATE HEALTH INSURANCE | Primary: Family Medicine

## 2015-04-11 ENCOUNTER — Encounter: Payer: PRIVATE HEALTH INSURANCE | Primary: Family Medicine

## 2015-04-30 ENCOUNTER — Inpatient Hospital Stay: Admit: 2015-04-30 | Payer: PRIVATE HEALTH INSURANCE | Primary: Family Medicine

## 2015-04-30 DIAGNOSIS — M545 Low back pain: Secondary | ICD-10-CM

## 2015-04-30 NOTE — Progress Notes (Signed)
[facility]  PHYSICAL THERAPY PLAN OF CARE                                   [address] [place] [place] [address]     Ph: [phone]  Fax: [phone]    [x]  Certification  []  Recertification [x]   Plan of Care  []  Progress Note []  Discharge      To:  Referring Practitioner: Dr. Lillia Dallas      From:  Carvel Getting, PT  Patient: Alisha Mccall     DOB: 1965-10-12  Diagnosis: LBP     Date: 04/30/2015  Treatment Diagnosis: LBP    Plan of Care/Certification Expiration Date: 05/29/15  Progress Report Period from:  04/30/2015  to 04/30/2015    Total # of Visits to Date: 1   No Show: 0    Canceled Appointment: 0     OBJECTIVE:   Long Term Goals - Time Frame for Long term goals : 4 weeks  Goals Current/ Discharge status Met   Long term goal 1: Pain 5/10 or less with activity 10/10 at worst []  yes  [x]  no   Long term goal 2: Increase Lumbar AROM by 25% to increase ease of ADL's. PROM RLE (degrees)  RLE General PROM: Knee PROM WNL, unable to assess hip d/t pt c/o pain     PROM LLE (degrees)  LLE General PROM: Knee PROM WNL, unable to assess hip d/t pt c/o pain     Spine  Lumbar: AROM flex=10%, ext=0 []  yes  [x]  no   Long term goal 3: Increase Bil. LE strength to 4/5 to Assist with gait and ADL's. Strength RLE  Comment: quads=3-/5  HS=3-/5  Iliopsoas=3/5    Strength LLE  Comment: quads=3/5  HS=3/5  Iliopsoas=3/5   []  yes  [x]  no   Long term goal 4: Return to 75% functional activity. 35% function per Oswestry []  yes  [x]  no       Body structures, Functions, Activity limitations: Decreased functional mobility , Decreased ADL status, Decreased ROM, Decreased strength, Decreased endurance  Assessment: Pt demonstrates decreased functional strength and mobility of the low back and LE's affecting her ability to perform all ADL's.  Specific instructions for Next Treatment: initiate aquatic therapy.  Prognosis: Fair  Discharge Recommendations: Continue to assess pending progress      New Education Provided: Reviewed aquatic info  sheet with pt and Biomedical engineer.  G-Codes  PT G-Codes  Functional Assessment Tool Used: Oswestry   Score: 32/50  Functional Limitation: Mobility: Walking and moving around  Mobility: Walking and Moving Around Current Status [phone]): At least 60 percent but less than 80 percent impaired, limited or restricted  Mobility: Walking and Moving Around Goal Status [phone]): At least 20 percent but less than 40 percent impaired, limited or restricted    PLAN: [x]  Evaluate and Treat  Frequency/Duration:  Plan  Times per week: 2  Plan weeks: 4  Specific instructions for Next Treatment: initiate aquatic therapy.  Current Treatment Recommendations: Strengthening, ROM, Therapist, nutritional, Hotel manager, Home Exercise Program  Plan Comment: Transfer pt care to Delorse Lek, PT                ?     Patient Status:[x]  Continue/ Initiate plan of Care    []  Discharge PT.  Recommend pt continue with HEP.     []  Additional visits requested, Please re-certify for  additional visits:          Signature: Electronically signed by Carvel Getting, PT on 04/30/15 at 4:47 PM      If you have any questions or concerns, please don't hesitate to call.  Thank you for your referral.    I have reviewed this plan of care and certify a need for medically necessary rehabilitation services.    Physician Signature:__________________________________________________________  Date:  Please sign and return

## 2015-04-30 NOTE — Progress Notes (Signed)
[facility] [place]  PHYSICAL THERAPY EVALUATION    Date: 04/30/2015  Patient Name: Alisha Mccall       MRN: [id number]   Account: [id number]   DOB: 03/25/1965  (50 y.o.)   Gender: female   Referring Practitioner: Dr. 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   Past Surgical History:   has a past surgical history that includes Abdominal hernia repair; Cardiac surgery; Coronary angioplasty with stent; Hysterectomy (.06/11/2008); laparotomy (12/09/2009); Ovary removal (Left, 04/15/2009); Colonoscopy (12/03/2006); Tooth Extraction (04/27/2007); and Cardiac catheterization (10/18/2012).    Vital Signs  Patient Currently in Pain: Yes   Pain Screening  Patient Currently in Pain: Yes  Pain Assessment  Pain Assessment: 0-10  Pain Level: 8 (10/10 at worst)  Pain Type: Chronic pain  Pain Location: Back  Pain Orientation: Right;Left;Lower;Mid;Upper  Pain Radiating Towards: "all over"  Pain Frequency: Continuous  Effect of Pain on Daily Activities: Pt has difficulty with all activities d/t pain.     Lives With: Daughter  Type of Home: House  Home Layout: Two level;Able to Live on Main level with bedroom/bathroom  Home Access: Stairs to enter with rails  Entrance Stairs - Number of Steps: 4  Entrance Stairs - Rails: Both  Bathroom Shower/Tub: Tub/Shower unit;Shower chair with back  Home Equipment: Engineer, siteCane;Rolling walker  Receives  Help From: Home health;Family  ADL Assistance: Needs assistance  Bath: Minimal assistance  Dressing: Minimal assistance  Grooming: Minimal assistance  Homemaking Assistance: Needs assistance  Ambulation Assistance: Independent  Transfer Assistance: Independent     Subjective:  Subjective: Pt reports 17 yr hx of LBP of insidious onset that has progressively worsened over time.  Pt states imaging of the back was done, but unable to give anymore information.     Objective:   Sensation  Overall Sensation Status: WFL    Balance  Posture: Poor  Sitting - Static: Good  Sitting - Dynamic: Good  Standing - Static: Good  Standing - Dynamic: Good, -    Strength RLE  Comment: quads=3-/5  HS=3-/5  Iliopsoas=3/5    Strength LLE  Comment: quads=3/5  HS=3/5  Iliopsoas=3/5      PROM RLE (degrees)  RLE General PROM: Knee PROM WNL, unable to assess hip d/t pt c/o pain     PROM LLE (degrees)  LLE General PROM: Knee PROM WNL, unable to assess hip d/t pt c/o pain    Spine  Lumbar: AROM flex=10%, ext=0    Observation/Palpation  Posture: Poor (Pt slouched in sitting, leans to the right.)    Assessment:  Body structures, Functions, Activity limitations: Decreased functional mobility , Decreased ADL status, Decreased ROM, Decreased strength, Decreased endurance  Assessment: Pt demonstrates decreased functional strength and mobility of the low back and LE's affecting her ability to perform all ADL's.  Specific instructions for Next Treatment: initiate aquatic therapy.  Prognosis: Fair  Discharge Recommendations: Continue to assess pending progress  Activity Tolerance:  Patient limited by pain  Activity Tolerance: Pt refused to stay on bed for ROM and special testing d/t c/o pain.  However, pt movements and body language did not coincide with c/o pain.     Decision Making: High Complexity  History: CAD, COPD, CHF, hx of psychiatric problems  Exam: Oswestry=32/50, MMT LE's, lumbar AROM  Clinical Presentation: unpredictable characteristics with  listed comorbidities      Plan  Frequency/Duration:  Plan  Times per week: 2  Plan weeks: 4  Specific instructions for Next Treatment: initiate aquatic therapy.  Current Treatment Recommendations: Strengthening, ROM, Building services engineer, Location manager, Home Exercise Program  Plan Comment: Transfer pt care to Otila Back, PT     G-Codes  PT G-Codes  Functional Assessment Tool Used: Oswestry   Score: 32/50  Functional Limitation: Mobility: Walking and moving around  Mobility: Walking and Moving Around Current Status ([id number]): At least 60 percent but less than 80 percent impaired, limited or restricted  Mobility: Walking and Moving Around Goal Status [phone]): At least 20 percent but less than 40 percent impaired, limited or restricted    Patient Education  New Education Provided: Reviewed aquatic info sheet with pt and Warden/ranger.    POST-PAIN  No     Yes      Location:  "all over"  Pain Rating (0-10 pain scale): 8  Pain Description: same  Action:  NA   Call Physician   Perform HEP   Meds as prescribed    Evaluation and patient rights have been reviewed and patient agrees with plan of care.  Yes    No     Explain:     Morse Fall Risk Assessment  Risk Factor Scale  Score   History of Falls  Yes   No 25  0 25   Secondary Diagnosis  Yes   No 15  0 15   Ambulatory Aid  Furniture   Crutches/cane/walker   None/bedrest/wheelchair/nurse 30  15  0 0   IV/Heparin Lock  Yes   No 20  0 0   Gait/Transferring  Impaired   Weak   Normal/bedrest/immobile 20  10  0 10   Mental Status  Forgets limitations   Oriented to own ability 15  0       Total:50     Based on the Assessment score: check the appropriate box.    No intervention needed   Low =   Score of 0-24    Use standard prevention interventions Moderate =  Score of 24-44    Discuss fall prevention strategies    Indicate moderate falls risk on eval    Use high risk prevention interventions High = Score of  45 and higher    Discuss fall prevention strategies    Provide supervision during treatment time    Goals  Long term goals  Time Frame for Long term goals : 4 weeks  Long term goal 1: Pain 5/10 or less with activity  Long term goal 2: Increase Lumbar AROM by 25% to increase ease of ADL's.  Long term goal 3: Increase Bil. LE strength to 4/5 to Assist with gait and ADL's.  Long term goal 4: Return to 75% functional activity.    PT Individual Minutes  Time In: 1315  Time Out: 1350  Minutes: 35    Electronically signed by Gwinda Maine, PT on 04/30/15 at 4:45 PM

## 2015-05-03 NOTE — Progress Notes (Signed)
[place] [facility]    [x]  [facility] [place]       Physical Therapy  Cancellation/No-show Note  Patient Name:  Alisha Mccall  DOB:  02/27/1965   Date:  05/03/2015  Referring Practitioner: Dr. Gatha Mayersama Malak  Diagnosis: LBP    Visit Information:  PT Visit Information  Onset Date: 03/28/15  PT Insurance Information: [employer] (medicare)  Total # of Visits Approved: 20  Total # of Visits to Date: 1  Plan of Care/Certification Expiration Date: 05/29/15  No Show: 0  Canceled Appointment: 1  Progress Note Counter: 1/10 Pt Cx'd this date.     For today's appointment patient:  [x]   Cancelled  []   Rescheduled appointment  []   No-show   []   Called pt to remind of next appointment     Reason given by patient:  []   Patient ill  []   Conflicting appointment  []   No transportation    []   Conflict with work  []   No reason given  [x]   Other:       Comments:    Pt cancelled appt D/T weather- "too cold to go in the pool."     Signature: Electronically signed by Zenia Residesachelle Temitope Flammer, PTA on 05/03/15 at 2:11 PM

## 2015-05-07 ENCOUNTER — Encounter: Primary: Family Medicine

## 2015-05-09 ENCOUNTER — Encounter: Primary: Family Medicine

## 2015-05-14 ENCOUNTER — Encounter: Primary: Family Medicine

## 2015-05-16 ENCOUNTER — Encounter: Primary: Family Medicine

## 2015-05-21 ENCOUNTER — Encounter: Primary: Family Medicine

## 2015-05-23 ENCOUNTER — Inpatient Hospital Stay: Primary: Family Medicine

## 2015-05-26 NOTE — Telephone Encounter (Signed)
Pt advised on attendance policy.  Considering discharge from PT due to non-return after eval.

## 2015-05-29 ENCOUNTER — Inpatient Hospital Stay: Payer: PRIVATE HEALTH INSURANCE | Primary: Family Medicine

## 2015-06-11 NOTE — Progress Notes (Signed)
[place] [facility]    [x]  [facility]  []  [facility] [place]     Physical Therapy  Cancellation/No-show Note  Patient Name:  Alisha AskewKelly M Gebert  DOB:  07/28/1965   Date:  06/11/2015    Pt was discharged earlier today.  Pt called back and requested to schedule an appointment.  Secretary then learned pt had been discharged due to > 30 day lapse in treatment.  Called pt back and informed pt unable to schedule at this time.  Requested speak to a supervisor.  I called pt at 1630 and left message for pt to call me back regarding this issue.       Signature: Electronically signed by Joneen RoachBarbara Davonta Stroot, PT on 06/11/15 at 4:32 PM

## 2015-06-11 NOTE — Progress Notes (Signed)
[facility]  PHYSICAL THERAPY PLAN OF CARE   [address] [place] [address]     Ph: [phone] Fax: [phone]  ??  []  Certification []  Recertification []  Plan of Care []  Progress Note [x]  Discharge  ????  To:  Referring Practitioner: Dr. Lillia Dallas  From:  Delorse Lek, PT  Patient: Alisha Mccall DOB: 1965/09/15  Diagnosis: LBP Date: 06/11/2015  Treatment Diagnosis: LBP  ??  Plan of Care/Certification Expiration Date: 05/29/15  Progress Report Period from: 04/30/2015 to 04/30/2015  ??  Total # of Visits to Date: 1 No Show: 0 Canceled Appointment: 0   ??  OBJECTIVE: from evaluation  Long Term Goals - Time Frame for Long term goals : 4 weeks  Goals Current/ Discharge status Met   Long term goal 1: Pain 5/10 or less with activity 10/10 at worst []  yes  [x]  no   Long term goal 2: Increase Lumbar AROM by 25% to increase ease of ADL's. PROM RLE (degrees)  RLE General PROM: Knee PROM WNL, unable to assess hip d/t pt c/o pain     PROM LLE (degrees)  LLE General PROM: Knee PROM WNL, unable to assess hip d/t pt c/o pain   ??  Spine  Lumbar: AROM flex=10%, ext=0 []  yes  [x]  no   Long term goal 3: Increase Bil. LE strength to 4/5 to Assist with gait and ADL's. Strength RLE  Comment: quads=3-/5 HS=3-/5 Iliopsoas=3/5   Strength LLE  Comment: quads=3/5 HS=3/5 Iliopsoas=3/5  []  yes  [x]  no   Long term goal 4: Return to 75% functional activity. 35% function per Oswestry []  yes  [x]  no   ??  ??  Body structures, Functions, Activity limitations: Decreased functional mobility , Decreased ADL status, Decreased ROM, Decreased strength, Decreased endurance  Assessment: Pt demonstrates decreased functional strength and mobility of the low back and LE's affecting her ability to perform all ADL's.  Specific instructions for Next Treatment: initiate aquatic therapy.  Prognosis: Fair  Discharge Recommendations: due to lack of attendance  ??  New Education Provided: Reviewed aquatic info sheet with pt and Biomedical engineer.  G-Codes  unexpected  D/C??  PLAN: [x] D/C due to non-return   Frequency/Duration:  Plan indicated per week: 2  Plan weeks: 4  Specific instructions for Next Treatment: initiate aquatic therapy.  Current Treatment Recommendations: Strengthening, ROM, Therapist, nutritional, Hotel manager, Home Exercise Program  Plan Comment: Transfer pt care to Delorse Lek, PT     ????  Patient Status:[]  Continue/ Initiate plan of Care  [x]  Discharge PT. Recommend pt continue with HEP.   []  Additional visits requested, Please re-certify for additional visits:  ????  ??  Signature: Electronically signed by Delorse Lek, PT on 06/11/15 at 10:12 AM  ??  ??  If you have any questions or concerns, please don't hesitate to call.  Thank you for your referral.  ??  I have reviewed this plan of care and certify a need for medically necessary rehabilitation services.  ??  Physician Signature:__________________________________________________________  Date:  Please sign and return   ??

## 2015-06-13 ENCOUNTER — Encounter: Payer: PRIVATE HEALTH INSURANCE | Primary: Family Medicine

## 2015-06-17 ENCOUNTER — Inpatient Hospital Stay: Payer: PRIVATE HEALTH INSURANCE | Primary: Family Medicine

## 2015-06-27 ENCOUNTER — Inpatient Hospital Stay: Admit: 2015-06-27 | Discharge: 2015-06-27 | Payer: PRIVATE HEALTH INSURANCE | Primary: Family Medicine

## 2015-06-27 DIAGNOSIS — I2699 Other pulmonary embolism without acute cor pulmonale: Secondary | ICD-10-CM

## 2015-06-27 LAB — PROTIME-INR
INR: 1.2
Protime: 14.5 seconds

## 2015-06-27 NOTE — Progress Notes (Signed)
Oral Anticoagulation Patient Education    Counseling provided to: Alisha HerrlichKelly Mccall  Anticoagulant: Warfarin   Handouts provided to patient include: medication, medications that increase risk of bleeding, nosebleeds    Counseling points included:  1. Indication for anticoagulation: pulmonary Embolus  2. Explanation of medication  3. Education on A. Fib and stroke; PE/DVT  4. Missed doses and timing of medication (contact healthcare provider if missed multiple doses)  5. Side effects: bruising and bleeding with emphasis on GI bleed symtoms  6. Food or drug interactions that can increase risk of bleeding  7. OTC pain relief options: Tylenol vs NSAIDs  8. Contact provider if:   A. Changes made to medications   B. Uncontrolled bleeding/ Signs and symptoms of recurrent VTE   C. Procedures   D. Trauma and/or fall    E. Pregnancy (for women of child bearing age)       INR  1.2 is subtherapetuic for this patient (goal range 2-3) and is reflective of 25 mg TWD  Patient verifies current dosing regimen, patient able to verbally recall dose  Patient reports no  missed doses since last INR   Patient denies s/sx clotting and/or stroke  Patient denies hematuria, epistaxis, rectal bleeding  Patient denies changes in diet, alcohol, or tobacco use  Reviewed medication list and drug allergies with patient, updated any medication additions or modifications accordingly. Patient not new to warfarin.  Patient also denies any pending medical or dental procedures scheduled at this time  Patient was instructed to take warfarin 7.5mg  today and then increase TWD to 35mg  and RTC 1 week

## 2015-07-04 ENCOUNTER — Inpatient Hospital Stay
Admit: 2015-07-04 | Discharge: 2015-07-04 | Payer: PRIVATE HEALTH INSURANCE | Attending: Pharmacist | Primary: Family Medicine

## 2015-07-04 DIAGNOSIS — I2609 Other pulmonary embolism with acute cor pulmonale: Secondary | ICD-10-CM

## 2015-07-04 LAB — PROTIME-INR
INR: 2.4
Protime: 29.3 seconds

## 2015-07-04 NOTE — Progress Notes (Signed)
Ms. Jannet AskewKelly M Scheirer is a 50 y.o. y/o female with history of PE who presents today for anticoagulation monitoring and adjustment.  INR 2.4 is therapeutic for this patient (goal range 2-3) and is reflective of 37.5 mg TWD  Patient verifies current dosing regimen, patient able to verbally recall dose  Patient reports no  missed doses since last INR   Patient denies s/sx clotting and/or stroke  Patient denies hematuria, epistaxis, rectal bleeding  Patient denies changes in diet, alcohol, or tobacco use  Reviewed medication list and drug allergies with patient, updated any medication additions or modifications accordingly  Patient also denies any pending medical or dental procedures scheduled at this time  Patient was instructed to dose at 35mg  TWD and RTC 1 week

## 2015-07-11 ENCOUNTER — Inpatient Hospital Stay
Admit: 2015-07-11 | Discharge: 2015-07-11 | Disposition: A | Discharge status: Home / Self Care | Payer: PRIVATE HEALTH INSURANCE

## 2015-07-11 ENCOUNTER — Ambulatory Visit: Payer: PRIVATE HEALTH INSURANCE | Primary: Family Medicine

## 2015-07-11 DIAGNOSIS — R112 Nausea with vomiting, unspecified: Secondary | ICD-10-CM

## 2015-07-11 LAB — CBC WITH AUTO DIFFERENTIAL
Basophils %: 0.4 %
Basophils Absolute: 0 10*3/uL (ref 0.0–0.2)
Eosinophils %: 2.9 %
Eosinophils Absolute: 0.2 10*3/uL (ref 0.0–0.7)
Hematocrit: 33.1 % — ABNORMAL LOW (ref 37.0–47.0)
Hemoglobin: 11 g/dL — ABNORMAL LOW (ref 12.0–16.0)
Lymphocytes %: 37.3 %
Lymphocytes Absolute: 2 10*3/uL (ref 1.0–4.8)
MCH: 31 pg (ref 27.0–31.3)
MCHC: 33.2 % (ref 33.0–37.0)
MCV: 93.5 fL (ref 82.0–100.0)
Monocytes %: 7.4 %
Monocytes Absolute: 0.4 10*3/uL (ref 0.2–0.8)
Neutrophils %: 52 %
Neutrophils Absolute: 2.8 10*3/uL (ref 1.4–6.5)
Platelets: 217 10*3/uL (ref 130–400)
RBC: 3.55 M/uL — ABNORMAL LOW (ref 4.20–5.40)
RDW: 13.7 % (ref 11.5–14.5)
WBC: 5.3 10*3/uL (ref 4.8–10.8)

## 2015-07-11 LAB — COMPREHENSIVE METABOLIC PANEL
ALT: <5 U/L (ref 0–33)
AST: 11 U/L (ref 0–35)
Albumin: 3.7 g/dL — ABNORMAL LOW (ref 3.9–4.9)
Alkaline Phosphatase: 67 U/L (ref 40–130)
Anion Gap: 12 mEq/L (ref 7–13)
BUN: 9 mg/dL (ref 6–20)
CO2: 19 mEq/L — ABNORMAL LOW (ref 22–29)
Calcium: 8.3 mg/dL — ABNORMAL LOW (ref 8.6–10.2)
Chloride: 108 mEq/L — ABNORMAL HIGH (ref 98–107)
Creatinine: 0.74 mg/dL (ref 0.50–0.90)
GFR African American: >60 (ref >60)
GFR Non-African American: >60 (ref >60)
Globulin: 2.3 g/dL (ref 2.3–3.5)
Glucose: 97 mg/dL (ref 74–109)
Potassium: 3.4 mEq/L — ABNORMAL LOW (ref 3.5–5.1)
Sodium: 139 mEq/L (ref 132–144)
Total Bilirubin: 0.2 mg/dL (ref 0.0–1.2)
Total Protein: 6 g/dL — ABNORMAL LOW (ref 6.4–8.1)

## 2015-07-11 LAB — PROTIME-INR
INR: 2.4
Protime: 26.5 s — ABNORMAL HIGH (ref 8.1–13.7)

## 2015-07-11 LAB — APTT: aPTT: 35.7 s — ABNORMAL HIGH (ref 21.6–35.4)

## 2015-07-11 LAB — LIPASE: Lipase: 37 U/L (ref 13–60)

## 2015-07-11 MED ORDER — ONDANSETRON 4 MG PO TBDP
4 MG | Freq: Once | ORAL | Status: AC
Start: 2015-07-11 — End: 2015-07-11
  Administered 2015-07-11: 06:00:00 4 mg via ORAL

## 2015-07-11 MED ORDER — DIPHENHYDRAMINE HCL 25 MG PO CAPS
25 MG | ORAL_CAPSULE | ORAL | 0 refills | Status: AC | PRN
Start: 2015-07-11 — End: 2015-07-21

## 2015-07-11 MED ORDER — DIPHENHYDRAMINE HCL 25 MG PO TABS
25 MG | Freq: Once | ORAL | Status: AC
Start: 2015-07-11 — End: 2015-07-11
  Administered 2015-07-11: 06:00:00 25 mg via ORAL

## 2015-07-11 MED ORDER — ONDANSETRON HCL 4 MG PO TABS
4 MG | ORAL_TABLET | Freq: Three times a day (TID) | ORAL | 0 refills | Status: DC | PRN
Start: 2015-07-11 — End: 2015-08-08

## 2015-07-11 MED FILL — ONDANSETRON 4 MG PO TBDP: 4 MG | ORAL | Qty: 1

## 2015-07-11 MED FILL — BANOPHEN 25 MG PO TABS: 25 MG | ORAL | Qty: 1
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
## 2015-07-12 ENCOUNTER — Ambulatory Visit: Attending: Pharmacist | Primary: Family Medicine

## 2015-07-12 DIAGNOSIS — I2699 Other pulmonary embolism without acute cor pulmonale: Secondary | ICD-10-CM

## 2015-07-12 NOTE — Progress Notes (Signed)
[facility] 07/11/15 causing to miss [facility] appt.  INR in ED was 2.4  Called and rescheduled patient for 07/25/15  Reset Next Due INR to reflect new appt.

## 2015-07-12 NOTE — Telephone Encounter (Signed)
Patient seen in [facility] 07/11/15 for Non-intractable vomiting with nausea. Patient INR was 2.4 then.  Rescheduled for 07/25/15

## 2015-07-25 ENCOUNTER — Ambulatory Visit: Primary: Family Medicine

## 2015-07-25 ENCOUNTER — Ambulatory Visit: Payer: PRIVATE HEALTH INSURANCE | Primary: Family Medicine

## 2015-07-25 DIAGNOSIS — I2699 Other pulmonary embolism without acute cor pulmonale: Secondary | ICD-10-CM

## 2015-07-25 NOTE — Progress Notes (Signed)
Updated POC INR to reflect date when 2nd attempt to reach patient due:     Patient no call/no show for 07/25/15 appointment- attempted #1 to reach patient - mailbox (voicemail) is full. Will updated POC INR to reflect date of 2nd attempt due.

## 2015-07-25 NOTE — Telephone Encounter (Signed)
Patient no call/no show for 07/25/15 appointment- attempted #1 to reach patient - mailbox (voicemail) is full. Will updated POC INR to reflect date of 2nd attempt due.

## 2015-08-02 ENCOUNTER — Ambulatory Visit: Primary: Family Medicine

## 2015-08-02 DIAGNOSIS — I2699 Other pulmonary embolism without acute cor pulmonale: Secondary | ICD-10-CM

## 2015-08-02 NOTE — Progress Notes (Signed)
Called and reschedule missed appt.  Stressed importance of showing up for visits and explained the discharge process for non-compliance.  Updated date of next INR to reflect new appt date.

## 2015-08-04 DIAGNOSIS — F3181 Bipolar II disorder: Principal | ICD-10-CM

## 2015-08-04 NOTE — ED Notes (Signed)
Dr Haynes BastKessler in to see pt      Kain Milosevic D. Christell ConstantMoore, RN  08/04/15 2141

## 2015-08-04 NOTE — ED Notes (Signed)
U/A spec obtained and sent to Lab      Rian Busche D. Christell ConstantMoore, RN  08/04/15 2124

## 2015-08-04 NOTE — ED Notes (Signed)
Pt asleep in recliner.  Woke her to  See if she wants to be in the bed where she was assigned.  Declined, saying she was comfortable in recliner. Will consult Dr re labs.     Melven Sartoriushristine L Francile Woolford, RN  08/04/15 [phone]

## 2015-08-04 NOTE — ED Notes (Signed)
Pt's family member brought in food for pt      Irwin Toran D. Christell ConstantMoore, RN  08/04/15 2210
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
## 2015-08-04 NOTE — ED Notes (Signed)
Medicated with 800mg  of motrin.     Jama FlavorsKaren M Ellarose Brandi, LPN  16/11/9604/18/17 [id number]

## 2015-08-04 NOTE — ED Notes (Signed)
 1mg  of ativan po given .     Jama FlavorsKaren M Sharyl Panchal, LPN  09/81/1906/18/17 [id number]

## 2015-08-04 NOTE — ED Notes (Signed)
Lab here to draw pt      Alisha Mccall D. Christell ConstantMoore, RN  08/04/15 2026

## 2015-08-04 NOTE — ED Notes (Signed)
Labs ordered /notified      Kristapher Dubuque D. Christell ConstantMoore, RN  08/04/15 2023

## 2015-08-04 NOTE — ED Notes (Signed)
Pt sitting up in geri chair watching TV unable to provide UA spec at this time  encouraged to drink fluids      Endi Lagman D. Christell ConstantMoore, RN  08/04/15 2119

## 2015-08-04 NOTE — ED Notes (Signed)
Pt 's chart to Physician      Kristofor Michalowski D. Christell ConstantMoore, RN  08/04/15 2042

## 2015-08-04 NOTE — ED Triage Notes (Addendum)
50 yr old AA/F arrived as a walk in c/o" racing thoughts " verbalizes SI with plan to O.D. On pills verbalizes  HX 15 yrs ago of  O.D. Pt admits to binge drinking 2-3 times a month and states she used crack cocaine  Last night pt states [facility] Wallis BambergIberhim  but states she missed her last apt  Next scheduled apt 12/05/15

## 2015-08-04 NOTE — ED Notes (Signed)
Pt's chart to physician     Leeyah Heather D. Christell ConstantMoore, RN  08/04/15 2031

## 2015-08-04 NOTE — ED Notes (Signed)
Pt's chart to PHYSICIAN      Nori Winegar D. Christell ConstantMoore, RN  08/04/15 2030

## 2015-08-05 ENCOUNTER — Inpatient Hospital Stay: Admit: 2015-08-05 | Payer: PRIVATE HEALTH INSURANCE | Primary: Family Medicine

## 2015-08-05 ENCOUNTER — Inpatient Hospital Stay
Admit: 2015-08-05 | Discharge: 2015-08-08 | Disposition: A | Discharge status: Home / Self Care | Payer: PRIVATE HEALTH INSURANCE | Admitting: Psychiatry

## 2015-08-05 DIAGNOSIS — F3181 Bipolar II disorder: Secondary | ICD-10-CM

## 2015-08-05 LAB — URINE DRUG SCREEN
Amphetamine Screen, Urine: NEGATIVE (ref <1000)
Barbiturate Screen, Ur: NEGATIVE (ref <200)
Benzodiazepine Screen, Urine: NEGATIVE (ref <200)
Cannabinoid Scrn, Ur: NEGATIVE (ref <50)
Cocaine Metabolite Screen, Urine: POSITIVE — AB (ref <300)
Opiate Scrn, Ur: NEGATIVE (ref <300)
PCP Screen, Urine: NEGATIVE (ref <25)

## 2015-08-05 LAB — COMPREHENSIVE METABOLIC PANEL
ALT: 12 U/L (ref 0–33)
AST: 36 U/L — ABNORMAL HIGH (ref 0–35)
Albumin: 4 g/dL (ref 3.9–4.9)
Alkaline Phosphatase: 61 U/L (ref 40–130)
Anion Gap: 17 mEq/L — ABNORMAL HIGH (ref 7–13)
BUN: 17 mg/dL (ref 6–20)
CO2: 19 mEq/L — ABNORMAL LOW (ref 22–29)
Calcium: 8.3 mg/dL — ABNORMAL LOW (ref 8.6–10.2)
Chloride: 103 mEq/L (ref 98–107)
Creatinine: 1.09 mg/dL — ABNORMAL HIGH (ref 0.50–0.90)
GFR African American: >60 (ref >60)
GFR Non-African American: 53.1 — ABNORMAL LOW (ref >60)
Globulin: 2.2 g/dL — ABNORMAL LOW (ref 2.3–3.5)
Glucose: 93 mg/dL (ref 74–109)
Potassium: 3.8 mEq/L (ref 3.5–5.1)
Sodium: 139 mEq/L (ref 132–144)
Total Bilirubin: 0.4 mg/dL (ref 0.0–1.2)
Total Protein: 6.2 g/dL — ABNORMAL LOW (ref 6.4–8.1)

## 2015-08-05 LAB — CBC WITH AUTO DIFFERENTIAL
Basophils %: 0.8 %
Basophils Absolute: 0.1 10*3/uL (ref 0.0–0.2)
Eosinophils %: 0.8 %
Eosinophils Absolute: 0.1 10*3/uL (ref 0.0–0.7)
Hematocrit: 32.6 % — ABNORMAL LOW (ref 37.0–47.0)
Hemoglobin: 10.7 g/dL — ABNORMAL LOW (ref 12.0–16.0)
Lymphocytes %: 26.9 %
Lymphocytes Absolute: 3.2 10*3/uL (ref 1.0–4.8)
MCH: 30.5 pg (ref 27.0–31.3)
MCHC: 32.8 % — ABNORMAL LOW (ref 33.0–37.0)
MCV: 92.9 fL (ref 82.0–100.0)
Monocytes %: 8.3 %
Monocytes Absolute: 1 10*3/uL — ABNORMAL HIGH (ref 0.2–0.8)
Neutrophils %: 63.2 %
Neutrophils Absolute: 7.6 10*3/uL — ABNORMAL HIGH (ref 1.4–6.5)
Platelets: 211 10*3/uL (ref 130–400)
RBC: 3.51 M/uL — ABNORMAL LOW (ref 4.20–5.40)
RDW: 14.3 % (ref 11.5–14.5)
WBC: 12 10*3/uL — ABNORMAL HIGH (ref 4.8–10.8)

## 2015-08-05 LAB — CKMB & RELATIVE PERCENT
CK-MB Index: 0.6 % (ref 0.0–3.5)
CK-MB Index: 0.6 % (ref 0.0–3.5)
CK-MB: 6.3 ng/mL — ABNORMAL HIGH (ref 0.0–3.8)
CK-MB: 6.8 ng/mL — ABNORMAL HIGH (ref 0.0–3.8)

## 2015-08-05 LAB — TSH: TSH: 0.544 u[IU]/mL (ref 0.270–4.200)

## 2015-08-05 LAB — URINALYSIS WITH REFLEX TO CULTURE
Bilirubin Urine: NEGATIVE
Glucose, Ur: NEGATIVE mg/dL
Nitrite, Urine: NEGATIVE
Protein, UA: NEGATIVE mg/dL
Specific Gravity, UA: 1.017 (ref 1.005–1.030)
Urobilinogen, Urine: 0.2 E.U./dL (ref <2.0)
pH, UA: 5 (ref 5.0–9.0)

## 2015-08-05 LAB — CK
Total CK: 1104 U/L — ABNORMAL HIGH (ref 0–170)
Total CK: 1148 U/L — ABNORMAL HIGH (ref 0–170)

## 2015-08-05 LAB — ETHANOL
Ethanol Lvl: 20 mg/dL
Ethanol percent: 0.018 G/dL

## 2015-08-05 LAB — MICROSCOPIC URINALYSIS: RBC, UA: NONE SEEN /HPF (ref 0–2)

## 2015-08-05 LAB — RAPID STREP SCREEN: Rapid Strep A Screen: NEGATIVE

## 2015-08-05 LAB — PREGNANCY, URINE: HCG(Urine) Pregnancy Test: NEGATIVE

## 2015-08-05 LAB — PROTIME-INR
INR: 1.1
Protime: 11.4 s (ref 8.1–13.7)

## 2015-08-05 LAB — LITHIUM LEVEL: Lithium Lvl: 0.2 mEq/L — ABNORMAL LOW (ref 0.6–1.2)

## 2015-08-05 MED ORDER — GABAPENTIN 400 MG PO CAPS
400 MG | Freq: Every evening | ORAL | Status: DC
Start: 2015-08-05 — End: 2015-08-08
  Administered 2015-08-06 – 2015-08-08 (×3): 800 mg via ORAL

## 2015-08-05 MED ORDER — ACETAMINOPHEN 325 MG PO TABS
325 MG | ORAL | Status: DC | PRN
Start: 2015-08-05 — End: 2015-08-08
  Administered 2015-08-06: 01:00:00 650 mg via ORAL

## 2015-08-05 MED ORDER — ALUM & MAG HYDROXIDE-SIMETH 200-200-20 MG/5ML PO SUSP
200-200-20 MG/5ML | ORAL | Status: DC | PRN
Start: 2015-08-05 — End: 2015-08-08

## 2015-08-05 MED ORDER — HYDROXYZINE PAMOATE 50 MG PO CAPS
50 MG | Freq: Four times a day (QID) | ORAL | Status: DC | PRN
Start: 2015-08-05 — End: 2015-08-08
  Administered 2015-08-05: 20:00:00 50 mg via ORAL

## 2015-08-05 MED ORDER — LURASIDONE HCL 40 MG PO TABS
40 MG | Freq: Every evening | ORAL | Status: DC
Start: 2015-08-05 — End: 2015-08-08
  Administered 2015-08-06 – 2015-08-08 (×3): 80 mg via ORAL

## 2015-08-05 MED ORDER — PRAZOSIN HCL 5 MG PO CAPS
5 MG | Freq: Every evening | ORAL | Status: DC
Start: 2015-08-05 — End: 2015-08-08
  Administered 2015-08-06 – 2015-08-08 (×3): 5 mg via ORAL

## 2015-08-05 MED ORDER — SODIUM CHLORIDE 0.9 % IV SOLN
0.9 % | INTRAVENOUS | Status: DC
Start: 2015-08-05 — End: 2015-08-06

## 2015-08-05 MED ORDER — LORAZEPAM 1 MG PO TABS
1 MG | Freq: Once | ORAL | Status: AC
Start: 2015-08-05 — End: 2015-08-04
  Administered 2015-08-05: 02:00:00 1 mg via ORAL

## 2015-08-05 MED ORDER — IBUPROFEN 400 MG PO TABS
400 MG | Freq: Three times a day (TID) | ORAL | Status: DC | PRN
Start: 2015-08-05 — End: 2015-08-08
  Administered 2015-08-05 – 2015-08-07 (×2): 400 mg via ORAL

## 2015-08-05 MED ORDER — MAGNESIUM HYDROXIDE 400 MG/5ML PO SUSP
400 MG/5ML | Freq: Every day | ORAL | Status: DC | PRN
Start: 2015-08-05 — End: 2015-08-08

## 2015-08-05 MED ORDER — IBUPROFEN 800 MG PO TABS
800 MG | Freq: Once | ORAL | Status: AC
Start: 2015-08-05 — End: 2015-08-04
  Administered 2015-08-05: 02:00:00 800 mg via ORAL

## 2015-08-05 MED ORDER — HALOPERIDOL LACTATE 5 MG/ML IJ SOLN
5 MG/ML | Freq: Four times a day (QID) | INTRAMUSCULAR | Status: DC | PRN
Start: 2015-08-05 — End: 2015-08-08

## 2015-08-05 MED ORDER — HALOPERIDOL 5 MG PO TABS
5 MG | Freq: Four times a day (QID) | ORAL | Status: DC | PRN
Start: 2015-08-05 — End: 2015-08-08

## 2015-08-05 MED ORDER — WARFARIN SODIUM 5 MG PO TABS
5 MG | Freq: Once | ORAL | Status: AC
Start: 2015-08-05 — End: 2015-08-05
  Administered 2015-08-05: 23:00:00 7.5 mg via ORAL

## 2015-08-05 MED ORDER — BENZOCAINE-MENTHOL 15-3.6 MG MT LOZG
OROMUCOSAL | Status: DC | PRN
Start: 2015-08-05 — End: 2015-08-08
  Administered 2015-08-05 – 2015-08-08 (×7): 1 via ORAL

## 2015-08-05 MED ORDER — WARFARIN SODIUM 5 MG PO TABS
5 MG | Freq: Every day | ORAL | Status: DC
Start: 2015-08-05 — End: 2015-08-05

## 2015-08-05 MED ORDER — LITHIUM CARBONATE 300 MG PO CAPS
300 MG | Freq: Two times a day (BID) | ORAL | Status: DC
Start: 2015-08-05 — End: 2015-08-08
  Administered 2015-08-05 – 2015-08-08 (×7): 300 mg via ORAL

## 2015-08-05 MED ORDER — HYDROXYZINE HCL 50 MG/ML IM SOLN
50 MG/ML | Freq: Four times a day (QID) | INTRAMUSCULAR | Status: DC | PRN
Start: 2015-08-05 — End: 2015-08-08

## 2015-08-05 MED ORDER — NICOTINE 21 MG/24HR TD PT24
21 MG/24HR | Freq: Every day | TRANSDERMAL | Status: DC
Start: 2015-08-05 — End: 2015-08-06
  Administered 2015-08-05: 13:00:00 1 via TRANSDERMAL

## 2015-08-05 MED ORDER — ALBUTEROL SULFATE HFA 108 (90 BASE) MCG/ACT IN AERS
108 (90 Base) MCG/ACT | Freq: Four times a day (QID) | RESPIRATORY_TRACT | Status: DC | PRN
Start: 2015-08-05 — End: 2015-08-08
  Administered 2015-08-06: 17:00:00 2 via RESPIRATORY_TRACT

## 2015-08-05 MED ORDER — DIPHENHYDRAMINE HCL 50 MG PO CAPS
50 MG | Freq: Every evening | ORAL | Status: DC
Start: 2015-08-05 — End: 2015-08-08
  Administered 2015-08-06 – 2015-08-08 (×3): 100 mg via ORAL

## 2015-08-05 MED ORDER — WARFARIN DAILY DOSING BY PHARMACIST (PLACEHOLDER)
Status: DC
Start: 2015-08-05 — End: 2015-08-08

## 2015-08-05 MED ORDER — WARFARIN SODIUM 5 MG PO TABS
5 MG | Freq: Once | ORAL | Status: AC
Start: 2015-08-05 — End: 2015-08-05
  Administered 2015-08-05: 07:00:00 5 mg via ORAL

## 2015-08-05 MED FILL — LITHIUM CARBONATE 300 MG PO CAPS: 300 MG | ORAL | Qty: 1

## 2015-08-05 MED FILL — CEPACOL SORE THROAT 15-3.6 MG MT LOZG: OROMUCOSAL | Qty: 1

## 2015-08-05 MED FILL — PROVENTIL HFA 108 (90 BASE) MCG/ACT IN AERS: 108 (90 Base) MCG/ACT | RESPIRATORY_TRACT | Qty: 2.66

## 2015-08-05 MED FILL — SODIUM CHLORIDE 0.9 % IV SOLN: 0.9 % | INTRAVENOUS | Qty: 1000

## 2015-08-05 MED FILL — IBUPROFEN 800 MG PO TABS: 800 MG | ORAL | Qty: 1

## 2015-08-05 MED FILL — LORAZEPAM 1 MG PO TABS: 1 MG | ORAL | Qty: 1

## 2015-08-05 MED FILL — HYDROXYZINE PAMOATE 50 MG PO CAPS: 50 MG | ORAL | Qty: 1

## 2015-08-05 MED FILL — IBUPROFEN 400 MG PO TABS: 400 MG | ORAL | Qty: 1

## 2015-08-05 MED FILL — NICOTINE 21 MG/24HR TD PT24: 21 MG/24HR | TRANSDERMAL | Qty: 1

## 2015-08-05 MED FILL — COUMADIN 5 MG PO TABS: 5 MG | ORAL | Qty: 1

## 2015-08-05 MED FILL — COUMADIN 2.5 MG PO TABS: 2.5 MG | ORAL | Qty: 1
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
## 2015-08-05 NOTE — Progress Notes (Signed)
Pt up to unit. Pt states "I don't want to talk. I want to sleep." This writer asked pt is she is having any suicidal thoughts, pt states "I'm just in pain. My legs hurt." Offered pt tylenol-pt states "no." Pt refused to complete admission assessment or sign any consents. Pt refused orientation to unit, stating she has been here before. Pt shown to room, lied down in bed. Denies any needs.  Electronically signed by Edd FabianNicole R Murriel Eidem, RN on 08/05/15 at 4:44 AM

## 2015-08-05 NOTE — Progress Notes (Signed)
Patient refused to attend 1100 constructive leisure activity group despite staff encouragement. Electronically signed by Gavin PoundLauriann G. Anayiah Howden, LSW on 08/05/2015 at 12:03 PM

## 2015-08-05 NOTE — Progress Notes (Signed)
[facility]. Pt is a 50 year old female diagnosed with bipolar disorder, alcohol and cocaine abuse. [facility] an [facility], but is only sporadically compliant. To ER with complaint of racing thoughts. Appears depressed. Reports binging on alcohol three times a month. Last use of crack was yesterday. Reported suicidal thoughts with a plan to overdose to triage. Past hx of suicide attempt 15 years ago. Pt eating/drinking, but [facility]. Refusing assessment, just stating "I have a sore throat." Pt afebrile. Pt non compliant with meds-should be on coumadin for a hx of PE. Pt given 5 mg of comadin in ER. (INR 1.5) Pt gets PT/INR drawn once a week. Pt had  1 mg of ativan at 2150, motrin at 2200. Lithium level 0.2. CK 1148-ER physician aware, no fluids ordered, pt asymptomatic. VSS. Signed voluntary. Electronically signed by Edd FabianNicole R Remberto Lienhard, RN on 08/05/15 at 3:58 AM

## 2015-08-05 NOTE — Progress Notes (Signed)
Clinical Pharmacy Note    Alisha Mccall is a 50 y.o. female for whom pharmacy has been asked to manage warfarin therapy.    Reason for Admission: Bipolar exacerbation    Consulting Physician:  Jonna CoupKelani, Kafilat, APRN   Warfarin dose prior to admission: warfarin 5mg  daily   Warfarin Indication: PE   Target INR range: 2-3  Order for concomitant anticoagulant therapy: none at this time     Outpatient warfarin [facility] (referred by Dr. Ethelene Halamos)     Past Medical History:   Diagnosis Date   ??? CAD (coronary artery disease)    ??? Cardiac angina (HCC)    ??? CHF (congestive heart failure) (HCC)    ??? COPD (chronic obstructive pulmonary disease) (HCC)    ??? 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 GERD (gastroesophageal reflux disease)    ??? History of blood transfusion    ??? Hyperlipidemia    ??? Hypertension    ??? MI (myocardial infarction) (HCC)    ??? Neuromuscular disorder (HCC)    ??? Osteoarthritis    ??? Pneumonia    ??? Psychiatric problem    ??? Pulmonary embolism (HCC)    ??? Scoliosis                Recent Labs      08/04/15   2030   INR  1.1     Recent Labs      08/04/15   2033   HGB  10.7*   HCT  32.6*   PLT  211       Current warfarin drug-drug interactions: none at this time     Date INR Warfarin Dose   08/04/15 1.1 None given    08/05/15 Not drawn today  7.5 mg                                 Daily PT/INR until stable within therapeutic range. Will give warfarin 7.5mg  today only, as patient did not receive any warfarin doses yesterday and INR was normal at 1.1. Historically, patient is non-compliant with visiting the MTM services for her POC INR checks, and it is suspected that patient is non-compliant with taking her anticoagulation medications.     Thank you for the consult, pharmacy will continue to follow.     Elby ShowersErin E. Florenda Watt, PharmD   08/05/2015 11:13 AM

## 2015-08-05 NOTE — Care Coordination-Inpatient (Signed)
Pt refers assessment, pt states she doesn't feel well and doesn't want to sleep. Electronically signed by Beatrice LecherAshley Tesia Kathalene Sporer, LPC on 08/05/2015 at 1:37 PM

## 2015-08-05 NOTE — Progress Notes (Signed)
Pt accepted PRN tylenol for 9/10 generalized pain. Pt sitting calmly in group room, watching TV. Socializing with peers. States she needs a muscle relaxer and cough syrup. Complains of cough with green mucous at times. Respirations even and unlabored. Denies shortness of breath. Electronically signed by Edd FabianNicole R Jhoanna Heyde, RN on 08/05/15 at 9:37 PM

## 2015-08-05 NOTE — Care Coordination-Inpatient (Signed)
Brief Intervention and Referral to Treatment Summary    Patient was provided PHQ-9, AUDIT and DAST Screening:      PHQ-9 Score: 26  AUDIT Score: 16  DAST Score: 6    Patient???s substance use is considered    Low Risk/Healthy  Risk  Harmful  Dependent x    Patient???s depression is considered:    Minimal  Mild   Moderate  Moderately Severe  Severe x    Brief Education was provided:    Patient was receptive x  Patient was not receptive      Brief Intervention Is Provided (Only for AUDIT or DAST, there is no brief intervention for Depression)    Patient reports readiness to decrease and/or stop use and a plan was discussed x  Patient denies readiness to decrease and/or stop use and a plan was not discussed      Recommendations/Referrals for Brief and/or Specialized Treatment Provided to Patient  Patient reports relapse after being sober. Patient use ETOH and crack/cocaine which she states is a "binge use."  Patients current sponsor has spoken with her and patient agrees to go [facility]. Patient would like social work to arrange this.  Electronically signed by Melven Sartoriusarlene  Ortiz, LPCC on 08/05/2015 at 7:20 PM

## 2015-08-05 NOTE — Progress Notes (Signed)
Out on unit. Pts focus is on her throat pain. Upset with self for drinking and crack use. Pt states she has been sober for 20 years. Pt states her throat has been hurting since smoking crack. Denies SI but Pt is depressed and anxious. Pt thinks she needs to go to rehab to get back on track.

## 2015-08-05 NOTE — Progress Notes (Signed)
Patient participated in leisure assessment. Electronically signed by Gavin PoundLauriann G. Ger Ringenberg, LSW on 08/05/2015 at 2:57 PM

## 2015-08-05 NOTE — ED Notes (Signed)
Provisional Diagnosis:   Bipolar II.  Cocaine Abuse.  Noncompliance    Psychosocial and Contextual Factors:   Pt is irritable and uncooperative with assessment; will not disclose information at this time.  Last inpt [facility] in Dec. 2016 with positive response, per Dr notes.    C-SSRS Summary:  Pt verbalized to triage nurse, suicidal thoughts with method of OD.  Last suicide attempt was 15 years ago.      Patient: yes  Family: not available  Agency: [facility]      Present Suicidal Behavior:  yes    Verbal: yes    Attempt:no    Past Suicidal Behavior: yes    Verbal:yes    Attempt:yes      Self-Injurious/Self-Mutilation:none known    Trauma Identified:  no      Protective Factors:    Pt has knowledge of resources available and has providers in place for care.  [facility].    Risk Factors:    Increased impulsivity with use of alcohol and drugs, unresolved family issues, noncompliance with meds.    Clinical Summary:    50 [facility] with C/C of racing thoughts and suicidal thoughts.  She is irritable, anxious and withdrawn.  Denies homicidal thoughts and hallucinations.  A&OX4.  Severe family stressors with 2 children in prison.  Open client [facility] but not compliant with visits or meds with next appointment in Oct.  Also has weekly visits for anticoagulant treatment but is sporadic with attendence and compliance with coumadin.  Reports bingening on alcohol "about 3 times a month" and smoked crack on 08-04-15.      Level of Care Disposition:    Inpt [facility]    Insurance Precertification Authorization:  Medicare     Melven Sartoriushristine L Kierstynn Babich, RN  08/05/15 [phone]

## 2015-08-05 NOTE — Progress Notes (Signed)
Spoke with hospitalist J. Dobritch reviewed coumadin dose and INR findings from yesterday, (no draw or result for today).No new orders at this time.

## 2015-08-05 NOTE — Progress Notes (Signed)
Hessie DienerAlan RN from [facility] attempted to put IV in pt X 2 attempts. Per Hessie DienerAlan pt was irritable, minimally cooperative,  And using profanity. Pt refused to have IV put anywhere other than hands. When IV inserted pt jerked hand away both times. IV insertion unsuccessful. Pt refusing to have IV put in at this time, stating she will not be poked again. Encouraged pt to drink water. Pt states she does not drink water, but will drink juice and pop. Electronically signed by Edd FabianNicole R Eloni Darius, RN on 08/05/15 at 10:14 PM

## 2015-08-05 NOTE — Progress Notes (Signed)
Group Therapy Note    Date: 08/05/15  Start Time: 1440  End Time:  1515    Number of Participants:8    Type of Group: Psychoeducation    Patient's Goal:  To participate in mood management group.    Notes: Patient declined to attend psychoeducation group at 1440despite encouragement by staff.     Discipline Responsible: Social Worker/Counselor    Lucretia FieldAnita M Awad Gladd, LISW

## 2015-08-05 NOTE — Progress Notes (Addendum)
Pt does have a raspy cough and does complain of a sore throat for which she recieves cepacol losenges . She had a rapid strep test which was negative . She was tearful when talking about being drunk and high and selling household items . She thinks she s ready for sober living and called [facility] today in hopes of securing a spot in sober living . She remains anxoius and depressed but not suicidal

## 2015-08-05 NOTE — Progress Notes (Signed)
Patient declined to attend the 0900 community meeting, despite staff encouragement. Electronically signed by Gavin PoundLauriann G. Ashlyn Cabler, LSW on 08/05/2015 at 11:02 AM

## 2015-08-05 NOTE — Progress Notes (Signed)
[facility] Respiratory Therapy Evaluation   Current Order:  Albuterol 2 puffs q6prn      Home Regimen: prn      Ordering Physician: saravanan  Re-evaluation Date:  6/22     Diagnosis: mental health      Patient Status: Stable / Unstable + Physician notified    The following MDI Criteria must be met in order to convert aerosol to MDI with spacer. If unable to meet, MDI will be converted to aerosol:  _0   Patient able to demonstrate the ability to use MDI effectively  _1   Patient alert and cooperative  _2   Patient able to take deep breath with 5-10 second hold  _3   Medication(s) available in this delivery method   _4   Peak flow greater than or equal to 200 ml/min            Current Order Substituted To  (same drug, same frequency)   Aerosol to MDI _5  Albuterol Sulfate 0.083% unit dose by aerosol Albuterol Sulfate MDI 2 puffs by inhalation with spacer    _6  Levalbuterol 1.25 mg unit dose by aerosol Levalbuterol MDI 2 puffs by inhalation with spacer    _7  Levalbuterol 0.63 mg unit dose by aerosol Levalbuterol MDI 2 puffs by inhalation with spacer    _8  Ipratropium Bromide 0.02% unit dose by aerosol Ipratropium Bromide MDI 2 puffs by inhalation with spacer    _9  Duoneb (Ipratropium + Albuterol) unit dose by aerosol Ipratropium MDI + Albuterol MDI 2 puffs by inhalation w/spacer   MDI to Aerosol _10  Albuterol Sulfate MDI Albuterol Sulfate 0.083% unit dose by aerosol    _11  Levalbuterol MDI 2 puffs by inhalation Levalbuterol 1.25 mg unit dose by aerosol    _12  Ipratropium Bromide MDI by inhalation Ipratropium Bromide 0.02% unit dose by aerosol    _13  Combivent (Ipratropium + Albuterol) MDI by inhalation Duoneb (Ipratropium + Albuterol) unit dose by aerosol   Treatment Assessment [Frequency/Schedule]:  Change frequency to: ___albuterol 2 puffs q4prn_______________________________________________per Protocol, P&T, MEC      Points 0 _14 Pulmonary Status  Non-Smoker  _15    Smoking history   < 20 pack years  _16    Smoking  history  ? 20 pack years  _17    Pulmonary Disorder  (acute or chronic)  _18    Severe or Chronic w/ Exacerbation  _19      Surgical Status No _20    Surgeries     General _21    Surgery Lower _22    Abdominal Thoracic or _23    Upper Abdominal Thoracic with  PulmonaryDisorder  _24      Chest X-ray Clear/Not  Ordered     _25   Chronic Changes  Results Pending  _26   Infiltrates, atelectasis, pleural effusion, or edema  _27   Infiltrates in more than one lobe _28   Infiltrate + Atelectasis, &/or pleural effusion  _29     Respiratory Pattern Regular,  RR = 12-20 _30   Increased,  RR = 21-25 _31   DOE, irregular,  or RR = 26-30 _32   Decreased FEV1  or RR = 31-35 _33   Severe SOB, use  of accessory muscles, or RR ? 35  _34     Mental Status Alert, oriented,  Cooperative _35   Confused but Follows commands _36   Lethargic or unable to follow commands _37   Obtunded  _38   Comatose  _39     Breath Sounds Clear to  auscultation  _40   Decreased unilaterally or  in bases only _41   Decreased  bilaterally  _42   Crackles or intermittent wheezes _0   Wheezes _1     Cough Strong, Spontan., & nonproductive _2   Strong,  spontaneous, &  productive _3   Weak,  Nonproductive _4   Weak, productive or  with wheezes _5   No spontaneous  cough or may require suctioning _6     Level of Activity Ambulatory _7   Ambulatory w/ Assist  _8   Non-ambulatory _9   Paraplegic _10   Quadriplegic _11     Total    Score:_____4__     Triage Score:____5____      Tri       Triage:     1. (>20) Freq: Q3    2. (16-20) Freq: Q4   3. (11-15) Freq: QID & Albuterol Q2 PRN    4. (6-10) Freq: TID & Albuterol Q2 PRN    5. (0-5) Freq Q4prn
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
## 2015-08-05 NOTE — Telephone Encounter (Signed)
Patient admitted to [facility] 08/05/15, next appt 08/07/15, anticipate this will need to be rescheduled. Pharmacy dosing warfarin while inpatient.

## 2015-08-05 NOTE — Care Coordination-Inpatient (Signed)
Pt has called [facility] and spoke with RON as she is interested in leaving here and attending outpt support with this organization.     Ron called the nurses station and asked that the pt's discharge planning consider his organization for outpatient support.    Ron states that they have an opening for NEXT week Tuesday and encourages any discussion regarding discharge and potential for the pt to attend.    Ron can be reached at [phone]

## 2015-08-05 NOTE — ED Notes (Signed)
Per Dr Haynes BastKessler orders, lithium level and PT/INR .  To recheck CK to determine if trending down.     Melven Sartoriushristine L Suhaila Troiano, RN  08/05/15 [phone]
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
## 2015-08-05 NOTE — ED Notes (Signed)
Met with pt to address psych assessments.  She is withdrawn and uncooperative.  Will not participate other than to say that she is "sick since this morning with a sore throat".  Will not permit pain assessment and has been taking ample food and fluids here.  Motrin given for complaint.  Has not been compliant with anticoagulant treatment or other meds.  Lithium level pending.     Bufford Spikes, RN  08/05/15 [phone]

## 2015-08-06 LAB — CK: Total CK: 472 U/L — ABNORMAL HIGH (ref 0–170)

## 2015-08-06 LAB — PROTIME-INR
INR: 1.3
Protime: 14.6 s — ABNORMAL HIGH (ref 8.1–13.7)

## 2015-08-06 LAB — CKMB & RELATIVE PERCENT
CK-MB Index: 0.5 % (ref 0.0–3.5)
CK-MB: 2.2 ng/mL (ref 0.0–3.8)

## 2015-08-06 LAB — CULTURE, URINE: Urine Culture, Routine: <50000

## 2015-08-06 MED ORDER — WARFARIN SODIUM 5 MG PO TABS
5 MG | Freq: Once | ORAL | Status: AC
Start: 2015-08-06 — End: 2015-08-06
  Administered 2015-08-06: 21:00:00 5 mg via ORAL

## 2015-08-06 MED ORDER — DM-GUAIFENESIN ER 30-600 MG PO TB12
30-600 MG | Freq: Two times a day (BID) | ORAL | Status: DC | PRN
Start: 2015-08-06 — End: 2015-08-08
  Administered 2015-08-06 – 2015-08-08 (×3): 1 via ORAL

## 2015-08-06 MED ORDER — NICOTINE POLACRILEX 4 MG MT GUM
4 MG | OROMUCOSAL | Status: DC | PRN
Start: 2015-08-06 — End: 2015-08-08
  Administered 2015-08-06 – 2015-08-08 (×7): 4 mg via ORAL

## 2015-08-06 MED ORDER — BUPROPION HCL ER (XL) 150 MG PO TB24
150 MG | Freq: Every day | ORAL | Status: DC
Start: 2015-08-06 — End: 2015-08-08
  Administered 2015-08-06 – 2015-08-08 (×3): 150 mg via ORAL

## 2015-08-06 MED FILL — LITHIUM CARBONATE 300 MG PO CAPS: 300 MG | ORAL | Qty: 1

## 2015-08-06 MED FILL — SODIUM CHLORIDE 0.9 % IV SOLN: 0.9 % | INTRAVENOUS | Qty: 1000

## 2015-08-06 MED FILL — NICOTINE 21 MG/24HR TD PT24: 21 MG/24HR | TRANSDERMAL | Qty: 1

## 2015-08-06 MED FILL — LATUDA 40 MG PO TABS: 40 MG | ORAL | Qty: 2

## 2015-08-06 MED FILL — WELLBUTRIN XL 150 MG PO TB24: 150 MG | ORAL | Qty: 1

## 2015-08-06 MED FILL — COUMADIN 5 MG PO TABS: 5 MG | ORAL | Qty: 1

## 2015-08-06 MED FILL — GABAPENTIN 400 MG PO CAPS: 400 MG | ORAL | Qty: 2

## 2015-08-06 MED FILL — MUCINEX DM 30-600 MG PO TB12: 30-600 MG | ORAL | Qty: 1

## 2015-08-06 MED FILL — CEPACOL SORE THROAT 15-3.6 MG MT LOZG: OROMUCOSAL | Qty: 1

## 2015-08-06 MED FILL — DIPHENHYDRAMINE HCL 50 MG PO CAPS: 50 MG | ORAL | Qty: 2

## 2015-08-06 MED FILL — NICORELIEF 4 MG MT GUM: 4 MG | OROMUCOSAL | Qty: 1

## 2015-08-06 MED FILL — PRAZOSIN HCL 5 MG PO CAPS: 5 MG | ORAL | Qty: 1

## 2015-08-06 MED FILL — ACETAMINOPHEN 325 MG PO TABS: 325 MG | ORAL | Qty: 2

## 2015-08-06 NOTE — Progress Notes (Signed)
Group Therapy Note    Date: 11/22/2014  Start Time: 10:00 AM  End Time:  10:45 AM    Number of Participants: 6    Type of Group: Psychotherapy      Patient's Goal:  Patient wants to get chemical dependency treatment at discharge.    Notes:  Patient is realistic about needing to focus on rehab at discharge.  Mood was improved.    Status After Intervention:  Improved    Participation Level: Active Listener and Interactive    Participation Quality: Appropriate, Attentive and Sharing      Speech:  normal      Thought Process/Content: Logical      Affective Functioning: Congruent      Mood: anxious      Level of consciousness:  Alert and Oriented x4      Response to Learning: Able to verbalize current knowledge/experience      Endings: None Reported    Modes of Intervention: Education, Support and Socialization      Discipline Responsible: Social Worker/Counselor      Signature:  Aries Townley A. Benna DunksBarber, LISW-S

## 2015-08-06 NOTE — Progress Notes (Signed)
Group Therapy Note    Date: 08/06/2015  Start Time: 1100  End Time:  1145  Number of Participants: 5    Type of Group: Psychoeducation    Wellness Binder Information  Module Name:    Session Number:      Patient's Goal:  "to feel better"    Notes:  Pt. attended the skill group. Good concentration. Left session to see the dr, and did not return.     Status After Intervention:  Unchanged    Participation Level: Active Listener and Interactive    Participation Quality: Appropriate and Attentive      Speech:  normal      Thought Process/Content: Logical      Affective Functioning: Flat      Mood: depressed      Level of consciousness:  Alert, Oriented x4 and Attentive      Response to Learning: Progressing to goal      Endings: None Reported    Modes of Intervention: Education, Support, Socialization and Activity      Discipline Responsible: Psychoeducational Specialist      Signature:  Mliss SaxPatricia A Christie Viscomi, EMCORCTRS

## 2015-08-06 NOTE — Plan of Care (Signed)
Problem: Health Behavior:  Goal: Ability to verbalize adaptive coping strategies to use when suicidal feelings occur will improve  Ability to verbalize adaptive coping strategies to use when suicidal feelings occur will improve   Outcome: Ongoing  Goal: Ability to verbalize adaptive coping strategies to use when the urge to self-mutilate occurs will improve  Ability to verbalize adaptive coping strategies to use when the urge to self-mutilate occurs will improve   Outcome: Ongoing    Problem: Safety:  Goal: Ability to contract for his/her safety will improve  Ability to contract for his/her safety will improve   Outcome: Met This Shift    Problem: Altered Mood, Depressive Behavior  Goal: LTG-Able to verbalize and/or display a decrease in depressive symptoms  Outcome: Ongoing  Goal: STG-Knowledge of positive coping patterns  Outcome: Ongoing  Goal: LTG-Able to verbalize acceptance of life and situations over which he or she has no control  Outcome: Ongoing  Goal: LTG-Able to verbalize and/or display a decrease in depressive symptoms  Outcome: Ongoing  Goal: STG-Able to verbalize suicidal ideations  Outcome: Met This Shift  Pt denies SI  Goal: STG-Able to verbalize support system  Outcome: Ongoing  Goal: LTG-Absence of self-harm  Outcome: Met This Shift  Goal: Patient Specific Goal  Outcome: Completed Date Met:  08/06/15  No goal specified  Goal: Participation in care planning  Outcome: Ongoing

## 2015-08-06 NOTE — Plan of Care (Signed)
Problem: Health Behavior:  Goal: Ability to verbalize adaptive coping strategies to use when suicidal feelings occur will improve  Ability to verbalize adaptive coping strategies to use when suicidal feelings occur will improve   Outcome: Met This Shift  Goal: Ability to verbalize adaptive coping strategies to use when the urge to self-mutilate occurs will improve  Ability to verbalize adaptive coping strategies to use when the urge to self-mutilate occurs will improve   Outcome: Met This Shift    Problem: Safety:  Goal: Ability to contract for his/her safety will improve  Ability to contract for his/her safety will improve   Outcome: Met This Shift    Problem: Altered Mood, Depressive Behavior  Goal: LTG-Able to verbalize and/or display a decrease in depressive symptoms  Outcome: Ongoing  Goal: STG-Knowledge of positive coping patterns  Outcome: Met This Shift  Goal: LTG-Able to verbalize acceptance of life and situations over which he or she has no control  Outcome: Ongoing  Goal: LTG-Able to verbalize and/or display a decrease in depressive symptoms  Outcome: Ongoing  Goal: STG-Able to verbalize suicidal ideations  Outcome: Met This Shift  Goal: STG-Able to verbalize support system  Outcome: Ongoing  Goal: LTG-Absence of self-harm  Outcome: Ongoing  Goal: Participation in care planning  Outcome: Ongoing    Problem: Substance Abuse  Goal: LTG-Absence of acute withdrawl symptoms  Outcome: Ongoing  Goal: STG-Knowledge of community resources  Outcome: Ongoing

## 2015-08-06 NOTE — Progress Notes (Signed)
Pt visible on unit. Social with select peers. Irritable with staff at times. Reports poor sleep d/t nightmares and poor appetite d/t sore throat. Denies SI, HI, Hallucinations. States she has been going to group, states she showered today but appears unkempt. Denies further needs. Fluids encouraged. Will continue to monitor. Electronically signed by Edd FabianNicole R Jonita Hirota, RN on 08/06/15 at 9:04 PM

## 2015-08-06 NOTE — Progress Notes (Signed)
Clinical Pharmacy Note    Warfarin consult follow-up    Recent Labs      08/06/15   0752   INR  1.3     Recent Labs      08/04/15   2033   HGB  10.7*   HCT  32.6*   PLT  211       Significant drug:drug interactions:  None at this time     Notes:  Date INR Warfarin Dose   08/04/15 1.1 None given    08/05/15 Not drawn today  7.5 mg and 5mg  given at 0330 (total 12.5mg )    08/06/15 1.3 5 mg                                        Daily PT/INR until stable within therapeutic range. As patient received 12.5mg  (dose at 0330 and dose at 1907). Will give warfarin 5mg  today only and continue to trend INR. INR likely to spike due to higher dose given yesterday.     Elby ShowersErin E. Natania Finigan, PharmD   08/06/2015 1:59 PM

## 2015-08-06 NOTE — Progress Notes (Signed)
Assessment completed.  Pt denies all at this time but stated that she sees things out of the corner of her eyes at home and occasionally hears footsteps in her home.  She thinks her deceased ex husband may be haunting her and is guarded about letting anyone know about this.  Calm and cooperative.  Actively coughing a wet unproductive cough which is making her chest sore.  She asked for a muscle relaxer and this nurse advised her to speak with the doctor when she sees him this a.m.  Denies further needs at this time.

## 2015-08-06 NOTE — Progress Notes (Signed)
BEHAVIORAL HEALTH FOLLOW-UP NOTE     08/06/2015     Patient was seen and examined in person, Chart reviewed   Patient's case discussed with staff/team    Chief Complaint: depression    Interim History:     Pt report feeling depressed hopeless and worthless  Passive death wishes  Pt is coming out of her room and attending groups  Pain issue and other psychosocial stressor discussed    Appetite:    Normal/Unchanged   Increased   Decreased      Sleep:        Normal/Unchanged   Fair        Poor              Energy:     Normal/Unchanged   Increased   Decreased        SI  Present   Absent    HI  Present   Absent     Aggression:   yes   no    Patient is  able   unable to CONTRACT FOR SAFETY     PAST MEDICAL/PSYCHIATRIC HISTORY:   Past Medical History:   Diagnosis Date   ??? CAD (coronary artery disease)    ??? Cardiac angina (HCC)    ??? CHF (congestive heart failure) (HCC)    ??? COPD (chronic obstructive pulmonary disease) (HCC)    ??? 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 GERD (gastroesophageal reflux disease)    ??? History of blood transfusion    ??? Hyperlipidemia    ??? Hypertension    ??? MI (myocardial infarction) (HCC)    ??? Neuromuscular disorder (HCC)    ??? Osteoarthritis    ??? Pneumonia    ??? Psychiatric problem    ??? Pulmonary embolism (HCC)    ??? Scoliosis        FAMILY/SOCIAL HISTORY:  Family History   Problem Relation Age of Onset   ??? Cirrhosis Mother    ??? Other Mother      Hepatitis B   ??? High Blood Pressure Father    ??? Cancer Maternal Grandmother    ??? Arthritis Maternal Grandmother    ??? Cancer Paternal Grandmother    ??? Scoliosis Paternal Grandmother    ??? High Blood Pressure Paternal Grandfather      Social History     Social History   ??? Marital status: Widowed     Spouse name: N/A   ??? Number of children: N/A   ??? Years of education: N/A     Occupational History   ??? Not on file.     Social History Main Topics   ??? Smoking status: Current Every Day Smoker     Packs/day: 0.50      Years: 38.00     Types: Cigarettes   ??? Smokeless tobacco: Not on file   ??? Alcohol use Yes      Comment: I just drink occasionally and can not stop    ??? Drug use: Yes     Special: Cocaine      Comment: States she has done it twice   ??? Sexual activity: Not Currently     Other Topics Concern   ??? Not on file     Social History Narrative           ROS:   All negative/unchanged except if checked. Explain positive(checked items) below:   Constitutional   Eyes   Ear/Nose/Mouth/Throat   Respiratory   CV    GI  []  GU  []  Musculoskeletal  []  Skin/Breast  []  Neurological  []  Endocrine  []  Heme/Lymph  []  Allergic/Immunologic    Explanation:     MEDICATIONS:    Current Facility-Administered Medications:   ???  nicotine polacrilex (NICORETTE) gum 4 mg, 4 mg, Oral, PRN, Gillermina HuBalaji Reagen Goates, MD, 4 mg at 08/06/15 1114  ???  buPROPion (WELLBUTRIN XL) extended release tablet 150 mg, 150 mg, Oral, Daily, Gillermina HuBalaji Milanya Sunderland, MD  ???  dextromethorphan-guaiFENesin (MUCINEX DM) 30-600 MG per extended release tablet 1 tablet, 1 tablet, Oral, BID PRN, Gillermina HuBalaji Doak Mah, MD  ???  ibuprofen (ADVIL;MOTRIN) tablet 400 mg, 400 mg, Oral, Q8H PRN, Desma Paganinianiel Ionescu, MD, 400 mg at 08/05/15 1543  ???  gabapentin (NEURONTIN) capsule 800 mg, 800 mg, Oral, Nightly, Desma Paganinianiel Ionescu, MD, 800 mg at 08/05/15 2127  ???  lithium capsule 300 mg, 300 mg, Oral, BID, Desma Paganinianiel Ionescu, MD, 300 mg at 08/06/15 [id number]  ???  lurasidone (LATUDA) tablet 80 mg, 80 mg, Oral, Nightly, Desma Paganinianiel Ionescu, MD, 80 mg at 08/05/15 2127  ???  prazosin (MINIPRESS) capsule 5 mg, 5 mg, Oral, Nightly, Desma Paganinianiel Ionescu, MD, 5 mg at 08/05/15 2127  ???  acetaminophen (TYLENOL) tablet 650 mg, 650 mg, Oral, Q4H PRN, Desma Paganinianiel Ionescu, MD, 650 mg at 08/05/15 2127  ???  magnesium hydroxide (MILK OF MAGNESIA) 400 MG/5ML suspension 30 mL, 30 mL, Oral, Daily PRN, Desma Paganinianiel Ionescu, MD  ???  aluminum & magnesium hydroxide-simethicone (MAALOX) 200-200-20 MG/5ML suspension 30 mL, 30 mL, Oral, PRN, Desma Paganinianiel Ionescu, MD  ???   diphenhydrAMINE (BENADRYL) capsule 100 mg, 100 mg, Oral, Nightly, Desma Paganinianiel Ionescu, MD, 100 mg at 08/05/15 2126  ???  haloperidol lactate (HALDOL) injection 5 mg, 5 mg, Intramuscular, Q6H PRN **OR** haloperidol (HALDOL) tablet 5 mg, 5 mg, Oral, Q6H PRN, Desma Paganinianiel Ionescu, MD  ???  hydrOXYzine (VISTARIL) injection 50 mg, 50 mg, Intramuscular, Q6H PRN **OR** hydrOXYzine (VISTARIL) capsule 50 mg, 50 mg, Oral, Q6H PRN, Desma Paganinianiel Ionescu, MD, 50 mg at 08/05/15 1628  ???  benzocaine-menthol (CEPACOL SORE THROAT) lozenge 1 lozenge, 1 lozenge, Oral, Q2H PRN, Gillermina HuBalaji Anquinette Pierro, MD, 1 lozenge at 08/05/15 2127  ???  warfarin (COUMADIN) daily dosing (placeholder), , Other, RX Placeholder, Jonna CoupKafilat Kelani, APRN  ???  albuterol sulfate HFA 108 (90 Base) MCG/ACT inhaler 2 puff, 2 puff, Inhalation, Q6H PRN, Jonna CoupKafilat Kelani, APRN  ???  0.9 % sodium chloride infusion, , Intravenous, Continuous, Kafilat Kelani, APRN      Examination:  BP 121/84   Pulse 67   Temp 98 ??F (36.7 ??C) (Oral)    Resp 18   Ht 5\' 4"  (1.626 m)   Wt 178 lb (80.7 kg)   SpO2 98%   BMI 30.55 kg/m2  Gait - steady  Medication side effects(SE): no    Mental Status Examination:    Level of consciousness:  within normal limits   Appearance:  fair grooming and fair hygiene  Behavior/Motor:  psychomotor retardation  Attitude toward examiner:  poor eye contact  Speech:  slow   Mood: anxious depressed  Affect:  mood congruent  Thought processes:  linear   Thought content:  Suicidal Ideation:  passive  Cognition:  oriented to person, place, and time   Concentration poor  Insight poor   Judgement poor     ASSESSMENT:   Patient symptoms are:  []  Well controlled  []  Improving  []  Worsening  [x]  No change      Diagnosis:   Principal Problem:    Bipolar 2 disorder (HCC)  LABS:    Recent Labs      08/04/15   2033   WBC  12.0*   HGB  10.7*   PLT  211     Recent Labs      08/04/15   2030   NA  139   K  3.8   CL  103   CO2  19*   BUN  17   CREATININE  1.09*   GLUCOSE  93     Recent Labs      08/04/15    2030   BILITOT  0.4   ALKPHOS  61   AST  36*   ALT  12     Lab Results   Component Value Date    LABAMPH Neg 08/04/2015    BARBSCNU Neg 08/04/2015    LABBENZ Neg 08/04/2015    LABBENZ NotDTCD 11/27/2010    OPIATESCREENURINE Neg 08/04/2015    PHENCYCLIDINESCREENURINE Neg 08/04/2015    ETOH 20 08/04/2015     Lab Results   Component Value Date    TSH 0.544 08/04/2015     Lab Results   Component Value Date    LITHIUM 0.2 (L) 08/04/2015     No results found for: VALPROATE, CBMZ    RISK ASSESSMENT:     Treatment Plan:  Reviewed current Medications with the patient.   Mucinex started  Wellbutrin XL 150 mg po q daily  Vistaril for anxiety  Risks, benefits, side effects, drug-to-drug interactions and alternatives to treatment were discussed.  Collateral information:   CD evaluation  Encourage patient to attend group and other milieu activities.  Discharge planning discussed with the patient and treatment team.    PSYCHOTHERAPY/COUNSELING:  [x]  Therapeutic interview  [x]  Supportive  []  CBT  []  Ongoing  []  Other    [x]  Patient continues to need, on a daily basis, active treatment furnished directly by or requiring the supervision of inpatient psychiatric personnel ??    Anticipated Length of stay:    Reason for more than one antipsychotic:  [x]  N/A  []  3 failed monotherapy(drugs tried):  []  Cross over to a new antipsychotic  []  Taper to monotherapy from polypharmacy  []  Augmentation of Clozapine therapy due to treatment resistance to single therapy  ??  ??    Electronically signed by Gillermina Hu, MD on 08/06/2015 at 11:26 AM

## 2015-08-06 NOTE — Progress Notes (Signed)
Pt visible on unit, watching tv, when asked about auditory hallucinations pt states she is not hearing any currently but reports, "that is why I'm watching tv, so I don't hear them."  Pt states she is still having racing thoughts, denies suicidal/homicidal ideation at this time.

## 2015-08-06 NOTE — Progress Notes (Signed)
Group Therapy Note    Date:08/06/15  Start Time: 1440  End Time:  1515    Number of Participants:7    Type of Group: Psychoeducation    Patient's Goal:  To participate in mood management group.    Notes: Patient declined to attend psychoeducation group at 1440 despite encouragement by staff.     Discipline Responsible: Social Worker/Counselor    Lucretia FieldAnita M Cleophus Mendonsa, LISW

## 2015-08-06 NOTE — Progress Notes (Signed)
Pt. refused to attend the 0900 community meeting due to resting in room, despite staff encouragement. Electronically signed by Mliss SaxPatricia A Shermeka Rutt, CTRS on 08/06/2015 at 9:40 AM

## 2015-08-06 NOTE — Progress Notes (Signed)
Pt accepted PRN motrin for 10/10 generalized pain, and cepacol lozenge for her throat. Pt sitting in dayroom watching TV. Will continue to monitor. Electronically signed by Edd FabianNicole R Kristalynn Coddington, RN on 08/06/15 at 8:24 PM

## 2015-08-07 ENCOUNTER — Encounter: Payer: PRIVATE HEALTH INSURANCE | Primary: Family Medicine

## 2015-08-07 LAB — PROTIME-INR
INR: 1.7
Protime: 18.6 s — ABNORMAL HIGH (ref 8.1–13.7)

## 2015-08-07 LAB — CULTURE BETA STREP CONFIRM PLATE

## 2015-08-07 MED ORDER — MELATONIN 1 MG PO TABS
1 MG | Freq: Every evening | ORAL | Status: DC
Start: 2015-08-07 — End: 2015-08-08
  Administered 2015-08-08: 01:00:00 5 mg via ORAL

## 2015-08-07 MED ORDER — WARFARIN SODIUM 5 MG PO TABS
5 MG | Freq: Once | ORAL | Status: AC
Start: 2015-08-07 — End: 2015-08-07
  Administered 2015-08-07: 22:00:00 5 mg via ORAL

## 2015-08-07 MED FILL — IBUPROFEN 400 MG PO TABS: 400 MG | ORAL | Qty: 1

## 2015-08-07 MED FILL — PRAZOSIN HCL 5 MG PO CAPS: 5 MG | ORAL | Qty: 1

## 2015-08-07 MED FILL — COUMADIN 5 MG PO TABS: 5 MG | ORAL | Qty: 1

## 2015-08-07 MED FILL — LATUDA 40 MG PO TABS: 40 MG | ORAL | Qty: 2

## 2015-08-07 MED FILL — NICORELIEF 4 MG MT GUM: 4 MG | OROMUCOSAL | Qty: 1

## 2015-08-07 MED FILL — MELATONIN 1 MG PO TABS: 1 MG | ORAL | Qty: 2

## 2015-08-07 MED FILL — DIPHENHYDRAMINE HCL 50 MG PO CAPS: 50 MG | ORAL | Qty: 2

## 2015-08-07 MED FILL — LITHIUM CARBONATE 300 MG PO CAPS: 300 MG | ORAL | Qty: 1

## 2015-08-07 MED FILL — SODIUM CHLORIDE 0.9 % IV SOLN: 0.9 % | INTRAVENOUS | Qty: 1000

## 2015-08-07 MED FILL — GABAPENTIN 400 MG PO CAPS: 400 MG | ORAL | Qty: 2

## 2015-08-07 MED FILL — CEPACOL SORE THROAT 15-3.6 MG MT LOZG: OROMUCOSAL | Qty: 1

## 2015-08-07 MED FILL — WELLBUTRIN XL 150 MG PO TB24: 150 MG | ORAL | Qty: 1

## 2015-08-07 NOTE — Progress Notes (Signed)
Group Therapy Note    Date: 11/22/2014  Start Time: 10:00 AM  End Time:  10:45 AM    Number of Participants: 8    Type of Group: Psychotherapy      Patient's Goal:  Patient wants to pursue chemical dependency treatment at [facility] at discharge.    Notes:  Patient is interactive with peers, has bright affect and has realistic goals for the future.    Status After Intervention:  Improved    Participation Level: Active Listener and Interactive    Participation Quality: Appropriate, Attentive and Sharing      Speech:  normal      Thought Process/Content: Logical      Affective Functioning: Congruent      Mood: anxious      Level of consciousness:  Alert and Oriented x4      Response to Learning: Able to verbalize current knowledge/experience      Endings: None Reported    Modes of Intervention: Education, Support and Socialization      Discipline Responsible: Social Worker/Counselor      Signature:  Eliot Popper A. Benna DunksBarber, LISW-S

## 2015-08-07 NOTE — Behavioral Health Treatment Team (Signed)
Client did not attend 1330 recovery group.

## 2015-08-07 NOTE — Progress Notes (Signed)
Pt. declined to attend the 0900 community meeting, despite staff encouragement. Electronically signed by Mliss SaxPatricia A Chelby Salata, CTRS on 08/07/2015 at 10:02 AM

## 2015-08-07 NOTE — Progress Notes (Signed)
Group Therapy Note    Date: 08/07/15  Start Time: 1440  End Time:  1515    Number of Participants:7    Type of Group: Psychoeducation    Patient's Goal:  To participate in mood management group.    Notes: Patient declined to attend psychoeducation group at 1440despite encouragement by staff.     Discipline Responsible: Social Worker/Counselor    Lucretia FieldAnita M Sophya Vanblarcom, LISW

## 2015-08-07 NOTE — Care Coordination-Inpatient (Signed)
Calm, cooperative, pleasant.  Watching tv in group room.  Denies all currently.  Pt. Reports A/V H have decreased.  Reports nightmares at night.  Appetite improved.  7/10 depression and 5/10 anxiety.  Bright affect at times.

## 2015-08-07 NOTE — Progress Notes (Signed)
Group Therapy Note    Date: 08/07/2015  Start Time: 1100  End Time:  1145  Number of Participants: 8    Type of Group: Psychoeducation    Wellness Binder Information  Module Name:    Session Number:      Patient's Goal:  "to feel better'    Notes:  Pt. attended the skill group. Shared freely her frustration about her kids taking advantage of her. Wants them to be independent and on their own. Proud of finished project. Wants to continue doing this craft after dc at home.    Status After Intervention:  Improved    Participation Level: Active Listener and Interactive    Participation Quality: Appropriate, Attentive and Sharing      Speech:  normal      Thought Process/Content: Logical      Affective Functioning: Congruent      Mood: calm      Level of consciousness:  Alert, Oriented x4 and Attentive      Response to Learning: Progressing to goal      Endings: None Reported    Modes of Intervention: Education, Support, Socialization and Activity      Discipline Responsible: Psychoeducational Specialist      Signature:  Mliss SaxPatricia A Aryaman Haliburton, EMCORCTRS

## 2015-08-07 NOTE — Progress Notes (Signed)
Clinical Pharmacy Note    Warfarin consult follow-up    Recent Labs      08/07/15   0739   INR  1.7     Recent Labs      08/04/15   2033   HGB  10.7*   HCT  32.6*   PLT  211       Significant drug:drug interactions:  None at this time      Notes:  Date INR Warfarin Dose   08/04/15 1.1 None given    08/05/15 Not drawn today  7.5 mg and 5mg  given at 0330 (total 12.5mg )    08/06/15 1.3 5 mg    08/07/15 1.7  5 mg                              Will give 5 mg again tonight as INR is rising on current dosing but is subtherapeutic. May need additional boost but is not warranted at this time.   Daily PT/INR until stable within therapeutic range.    Lawanna KobusJoshua Jimeka Balan, PharmD  PGY1 Pharmacy Resident  08/07/2015 1:11 PM

## 2015-08-07 NOTE — Plan of Care (Signed)
Problem: Health Behavior:  Goal: Ability to verbalize adaptive coping strategies to use when suicidal feelings occur will improve  Ability to verbalize adaptive coping strategies to use when suicidal feelings occur will improve   Outcome: Ongoing  Goal: Ability to verbalize adaptive coping strategies to use when the urge to self-mutilate occurs will improve  Ability to verbalize adaptive coping strategies to use when the urge to self-mutilate occurs will improve   Outcome: Ongoing    Problem: Safety:  Goal: Ability to contract for his/her safety will improve  Ability to contract for his/her safety will improve   Outcome: Met This Shift    Problem: Altered Mood, Depressive Behavior  Goal: LTG-Able to verbalize and/or display a decrease in depressive symptoms  Outcome: Ongoing  Goal: STG-Knowledge of positive coping patterns  Outcome: Ongoing  Goal: LTG-Able to verbalize acceptance of life and situations over which he or she has no control  Outcome: Ongoing  Goal: LTG-Able to verbalize and/or display a decrease in depressive symptoms  Outcome: Ongoing  Goal: STG-Able to verbalize suicidal ideations  Outcome: Ongoing  Goal: STG-Able to verbalize support system  Outcome: Ongoing  Goal: LTG-Absence of self-harm  Outcome: Met This Shift  Goal: Participation in care planning  Outcome: Ongoing    Problem: Substance Abuse  Goal: LTG-Absence of acute withdrawl symptoms  Outcome: Met This Shift  Goal: STG-Knowledge of community resources  Outcome: Ongoing

## 2015-08-07 NOTE — Progress Notes (Signed)
Pt denies all, no SI/HI.  NO AVH.  Pt napping in the early morning after eating.  Pt comes to groups and is open and helpful to peers.  Patient states she really just needs some rest and she will feel better.  Pt states she is not depressed, is still a little anxious but the Wellbutrin is helping.  Pt takes Nicotine gum prn.  Pt polite and cooperative and social on the unit.  Electronically signed by Ventura SellersSusan L Phyillis Dascoli, LPN on 0/98/11916/21/2017 at 11:35 AM

## 2015-08-07 NOTE — Progress Notes (Signed)
BEHAVIORAL HEALTH FOLLOW-UP NOTE     08/07/2015     Patient was seen and examined in person, Chart reviewed   Patient's case discussed with staff/team    Chief Complaint: depression    Interim History:     Pt is feeling less depressed  Did not sleep well.  [facility]  Getting the energy and motivation to do things.    Appetite:   [x]  Normal/Unchanged  []  Increased  []  Decreased      Sleep:       []  Normal/Unchanged  [x]  Fair       []  Poor              Energy:    [x]  Normal/Unchanged  []  Increased  []  Decreased        SI []  Present  [x]  Absent    HI  [] Present  [x]  Absent     Aggression:  []  yes  [x]  no    Patient is []  able  []  unable to CONTRACT FOR SAFETY     PAST MEDICAL/PSYCHIATRIC HISTORY:   Past Medical History:   Diagnosis Date   ??? CAD (coronary artery disease)    ??? Cardiac angina (HCC)    ??? CHF (congestive heart failure) (HCC)    ??? COPD (chronic obstructive pulmonary disease) (HCC)    ??? 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 GERD (gastroesophageal reflux disease)    ??? History of blood transfusion    ??? Hyperlipidemia    ??? Hypertension    ??? MI (myocardial infarction) (HCC)    ??? Neuromuscular disorder (HCC)    ??? Osteoarthritis    ??? Pneumonia    ??? Psychiatric problem    ??? Pulmonary embolism (HCC)    ??? Scoliosis        FAMILY/SOCIAL HISTORY:  Family History   Problem Relation Age of Onset   ??? Cirrhosis Mother    ??? Other Mother      Hepatitis B   ??? High Blood Pressure Father    ??? Cancer Maternal Grandmother    ??? Arthritis Maternal Grandmother    ??? Cancer Paternal Grandmother    ??? Scoliosis Paternal Grandmother    ??? High Blood Pressure Paternal Grandfather      Social History     Social History   ??? Marital status: Widowed     Spouse name: N/A   ??? Number of children: N/A   ??? Years of education: N/A     Occupational History   ??? Not on file.     Social History Main Topics   ??? Smoking status: Current Every Day Smoker     Packs/day: 0.50     Years: 38.00     Types: Cigarettes   ??? Smokeless  tobacco: Not on file   ??? Alcohol use Yes      Comment: I just drink occasionally and can not stop    ??? Drug use: Yes     Special: Cocaine      Comment: States she has done it twice   ??? Sexual activity: Not Currently     Other Topics Concern   ??? Not on file     Social History Narrative           ROS:  [x]  All negative/unchanged except if checked. Explain positive(checked items) below:  []  Constitutional  []  Eyes  []  Ear/Nose/Mouth/Throat  []  Respiratory  []  CV  []  GI  []   GU   Musculoskeletal   Skin/Breast   Neurological   Endocrine   Heme/Lymph   Allergic/Immunologic    Explanation:     MEDICATIONS:    Current Facility-Administered Medications:   ???  melatonin tablet 5 mg, 5 mg, Oral, Nightly, Gillermina Hu, MD  ???  nicotine polacrilex (NICORETTE) gum 4 mg, 4 mg, Oral, PRN, Gillermina Hu, MD, 4 mg at 08/07/15 1105  ???  buPROPion (WELLBUTRIN XL) extended release tablet 150 mg, 150 mg, Oral, Daily, Gillermina Hu, MD, 150 mg at 08/07/15 0852  ???  dextromethorphan-guaiFENesin (MUCINEX DM) 30-600 MG per extended release tablet 1 tablet, 1 tablet, Oral, BID PRN, Gillermina Hu, MD, 1 tablet at 08/06/15 1534  ???  ibuprofen (ADVIL;MOTRIN) tablet 400 mg, 400 mg, Oral, Q8H PRN, Desma Paganini, MD, 400 mg at 08/06/15 2021  ???  gabapentin (NEURONTIN) capsule 800 mg, 800 mg, Oral, Nightly, Desma Paganini, MD, 800 mg at 08/06/15 2123  ???  lithium capsule 300 mg, 300 mg, Oral, BID, Desma Paganini, MD, 300 mg at 08/07/15 1610  ???  lurasidone (LATUDA) tablet 80 mg, 80 mg, Oral, Nightly, Desma Paganini, MD, 80 mg at 08/06/15 2123  ???  prazosin (MINIPRESS) capsule 5 mg, 5 mg, Oral, Nightly, Desma Paganini, MD, 5 mg at 08/06/15 2123  ???  acetaminophen (TYLENOL) tablet 650 mg, 650 mg, Oral, Q4H PRN, Desma Paganini, MD, 650 mg at 08/05/15 2127  ???  magnesium hydroxide (MILK OF MAGNESIA) 400 MG/5ML suspension 30 mL, 30 mL, Oral, Daily PRN, Desma Paganini, MD  ???  aluminum & magnesium hydroxide-simethicone (MAALOX) 200-200-20  MG/5ML suspension 30 mL, 30 mL, Oral, PRN, Desma Paganini, MD  ???  diphenhydrAMINE (BENADRYL) capsule 100 mg, 100 mg, Oral, Nightly, Desma Paganini, MD, 100 mg at 08/06/15 2123  ???  haloperidol lactate (HALDOL) injection 5 mg, 5 mg, Intramuscular, Q6H PRN **OR** haloperidol (HALDOL) tablet 5 mg, 5 mg, Oral, Q6H PRN, Desma Paganini, MD  ???  hydrOXYzine (VISTARIL) injection 50 mg, 50 mg, Intramuscular, Q6H PRN **OR** hydrOXYzine (VISTARIL) capsule 50 mg, 50 mg, Oral, Q6H PRN, Desma Paganini, MD, 50 mg at 08/05/15 1628  ???  benzocaine-menthol (CEPACOL SORE THROAT) lozenge 1 lozenge, 1 lozenge, Oral, Q2H PRN, Gillermina Hu, MD, 1 lozenge at 08/06/15 2021  ???  warfarin (COUMADIN) daily dosing (placeholder), , Other, RX Placeholder, Jonna Coup, APRN  ???  albuterol sulfate HFA 108 (90 Base) MCG/ACT inhaler 2 puff, 2 puff, Inhalation, Q6H PRN, Jonna Coup, APRN, 2 puff at 08/06/15 1305      Examination:  BP 132/80   Pulse 96   Temp 98 ??F (36.7 ??C) (Oral)    Resp 18   Ht  (1.626 m)   Wt 178 lb (80.7 kg)   SpO2 96%   BMI 30.55 kg/m2  Gait - steady  Medication side effects(SE): no    Mental Status Examination:    Level of consciousness:  within normal limits   Appearance:  fair grooming and fair hygiene  Behavior/Motor:  no abnormalities noted  Attitude toward examiner:  cooperative  Speech:  slow   Mood: less depressed  Affect:  mood congruent  Thought processes:  linear   Thought content:  Suicidal Ideation:  denies suicidal ideation  Cognition:  oriented to person, place, and time   Concentration intact  Insight fair   Judgement fair     ASSESSMENT:   Patient symptoms are:   Well controlled   Improving   Worsening   No change  Diagnosis:   Principal Problem:    Bipolar 2 disorder (HCC)      LABS:    Recent Labs      08/04/15   2033   WBC  12.0*   HGB  10.7*   PLT  211     Recent Labs      08/04/15   2030   NA  139   K  3.8   CL  103   CO2  19*   BUN  17   CREATININE  1.09*   GLUCOSE  93     Recent Labs       08/04/15   2030   BILITOT  0.4   ALKPHOS  61   AST  36*   ALT  12     Lab Results   Component Value Date    LABAMPH Neg 08/04/2015    BARBSCNU Neg 08/04/2015    LABBENZ Neg 08/04/2015    LABBENZ NotDTCD 11/27/2010    OPIATESCREENURINE Neg 08/04/2015    PHENCYCLIDINESCREENURINE Neg 08/04/2015    ETOH 20 08/04/2015     Lab Results   Component Value Date    TSH 0.544 08/04/2015     Lab Results   Component Value Date    LITHIUM 0.2 (L) 08/04/2015         Treatment Plan:  Reviewed current Medications with the patient.   Risks, benefits, side effects, drug-to-drug interactions and alternatives to treatment were discussed.  Collateral information  Encourage patient to attend group and other milieu activities.  Discharge planning discussed with the patient and treatment team.    PSYCHOTHERAPY/COUNSELING:  [x]  Therapeutic interview  [x]  Supportive  []  CBT  []  Ongoing  []  Other  Patient was seen 1:1 for 20 minutes, other than E&M time spent, focusing on      - coping skills techniques     - Anxiety management techniques discussed including deep breathing exercise and PMR     - discussing patients strength and weakness        [x]  Patient continues to need, on a daily basis, active treatment furnished directly by or requiring the supervision of inpatient psychiatric personnel ??    Anticipated Length of stay: tomorrow     Reason for more than one antipsychotic:  [x]  N/A  []  3 failed monotherapy(drugs tried):  []  Cross over to a new antipsychotic  []  Taper to monotherapy from polypharmacy  []  Augmentation of Clozapine therapy due to treatment resistance to single therapy  ??  ??    Electronically signed by Gillermina HuBALAJI Anyelin Mogle, MD on 08/07/2015 at 11:56 AM

## 2015-08-07 NOTE — Progress Notes (Signed)
Pt comes to the desk and states she feels shaky and would like her sugar checked.  Accu done 98.  Patient states that she is usually higher than that.  Patient told to have some juice or pop and crackers with peanut butter.  Patient refuses the peanut butter.

## 2015-08-08 LAB — PROTIME-INR
INR: 1.8
Protime: 20.2 s — ABNORMAL HIGH (ref 8.1–13.7)

## 2015-08-08 MED ORDER — WARFARIN SODIUM 5 MG PO TABS
5 MG | ORAL_TABLET | Freq: Every day | ORAL | 0 refills | Status: DC
Start: 2015-08-08 — End: 2015-08-27

## 2015-08-08 MED ORDER — MELATONIN 1 MG PO TABS
1 MG | ORAL_TABLET | Freq: Every evening | ORAL | 0 refills | Status: DC
Start: 2015-08-08 — End: 2016-02-25

## 2015-08-08 MED ORDER — BUPROPION HCL ER (XL) 150 MG PO TB24
150 MG | ORAL_TABLET | Freq: Every day | ORAL | 1 refills | Status: DC
Start: 2015-08-08 — End: 2016-02-18

## 2015-08-08 MED ORDER — METOPROLOL TARTRATE 25 MG PO TABS
25 MG | ORAL_TABLET | Freq: Two times a day (BID) | ORAL | 0 refills | Status: AC
Start: 2015-08-08 — End: ?

## 2015-08-08 MED FILL — WELLBUTRIN XL 150 MG PO TB24: 150 MG | ORAL | Qty: 1

## 2015-08-08 MED FILL — LITHIUM CARBONATE 300 MG PO CAPS: 300 MG | ORAL | Qty: 1

## 2015-08-08 MED FILL — MUCINEX DM 30-600 MG PO TB12: 30-600 MG | ORAL | Qty: 1

## 2015-08-08 MED FILL — NICORELIEF 4 MG MT GUM: 4 MG | OROMUCOSAL | Qty: 1

## 2015-08-08 MED FILL — CEPACOL SORE THROAT 15-3.6 MG MT LOZG: OROMUCOSAL | Qty: 1

## 2015-08-08 NOTE — Discharge Instructions (Signed)

## 2015-08-08 NOTE — Discharge Summary (Signed)
DOA: 08/05/15    DOD: 08/08/15    Diagnosis:     Bipolar 2 disorder    HPI: The patient is a 50 y.o. female with significant past history of addiction and depressionPt report that " my brain is acting crazy" for few days.Feeling depressed and miserable with her cold symptoms.Pt was drinking "too much" Not cooperative in telling me the quantity or type of alcoholLast drink 2 days agoPt admit to using crack - evasive about the patternTrying to quit.Pt report feeling depressed and hopelessNot want to talk to me now and left the interview room????Stressors:addiction Please refer to the initial admitting note for details of history and physical.      Hospital Course: Following admission to the hospital, pt was started on wellbutrin XL and melatonin. Pt's home psychiatric medication Neurontin and Latuda were continued but the doses were changed. Home lithium, benadryl,and minipress were continued. Pt tolerated without difficulty.Patient has been feeling better. Significant progress in the symptoms since admission.Mood better, with the score of 1/10No AVH or paranoid thoughtsNo Hopeless or worthless feelingNo active SI/HIPt [facility] Pt was [facility] at 10am with nurse paige for med management. Pt was instructed to continue taking Wellbutrin XL 150mg  Daily, melatonin 5mg  QHS, Neurontin 800mg  Latuda 80mg  Daily,  Benadryl 100mg  QHS, lithium 300mg  BID.Prazosin 10mg  QHS Pt received prescriptions for Wellbutrin XL and Melatonin.     Condition on Discharge: stable

## 2015-08-08 NOTE — Progress Notes (Signed)
Group Therapy Note    Date: 08/08/2015  Start Time: 1100  End Time:  1145  Number of Participants: 7    Type of Group: Psychoeducation    Wellness Binder Information  Module Name:    Session Number:      Patient's Goal:  "to feel better"    Notes:  Pt. attended the skill group. Pt. joined in the group with encouragement. Feels better and is optimistic that she will continue her treatment after dc.     Status After Intervention:  Improved    Participation Level: Active Listener and Interactive    Participation Quality: Appropriate, Attentive and Sharing      Speech:  normal      Thought Process/Content: Logical      Affective Functioning: Congruent      Mood: calm      Level of consciousness:  Alert, Oriented x4 and Attentive      Response to Learning: Progressing to goal      Endings: None Reported    Modes of Intervention: Education, Support, Socialization and Activity      Discipline Responsible: Psychoeducational Specialist      Signature:  Mliss SaxPatricia A Osaze Hubbert, EMCORCTRS

## 2015-08-08 NOTE — Progress Notes (Signed)
Group Therapy Note    Date: 11/22/2014  Start Time: 10:00 AM  End Time:  10:45 AM    Number of Participants: 7    Type of Group: Psychotherapy      Patient's Goal:  Patient wants to find employment at discharge.    Notes: Patient is optimistic about the future and had bright affect.    Status After Intervention:  Unchanged    Participation Level: Active Listener and Interactive    Participation Quality: Appropriate, Attentive and Sharing      Speech:  normal      Thought Process/Content: Logical      Affective Functioning: Congruent      Mood: anxious      Level of consciousness:  Alert and Oriented x4      Response to Learning: Able to verbalize current knowledge/experience      Endings: None Reported    Modes of Intervention: Education, Support and Socialization      Discipline Responsible: Social Worker/Counselor      Signature:  Ovie Eastep A. Benna DunksBarber, LISW-S

## 2015-08-08 NOTE — Progress Notes (Signed)
Pt denies SI, HI and A/V hallucinations. Pt bright in appearance and social with peers. Pt looking forward to discharge today. Pt demonstrated understanding of all discharge instructions including medications, follow up appointments and safety plan. Pt left the unit at 1335. Pt was picked up by for transport home. [facility] Pt stated that all belongings were present. Vergia AlconLiz Aava Deland RN

## 2015-08-08 NOTE — Plan of Care (Signed)
Problem: Health Behavior:  Goal: Ability to verbalize adaptive coping strategies to use when suicidal feelings occur will improve  Ability to verbalize adaptive coping strategies to use when suicidal feelings occur will improve   Outcome: Completed Date Met:  08/08/15  Goal: Ability to verbalize adaptive coping strategies to use when the urge to self-mutilate occurs will improve  Ability to verbalize adaptive coping strategies to use when the urge to self-mutilate occurs will improve   Outcome: Completed Date Met:  08/08/15    Problem: Safety:  Goal: Ability to contract for his/her safety will improve  Ability to contract for his/her safety will improve   Outcome: Completed Date Met:  08/08/15    Problem: Altered Mood, Depressive Behavior  Goal: LTG-Able to verbalize and/or display a decrease in depressive symptoms  Outcome: Completed Date Met:  08/08/15  Goal: STG-Knowledge of positive coping patterns  Outcome: Completed Date Met:  08/08/15  Goal: LTG-Able to verbalize acceptance of life and situations over which he or she has no control  Outcome: Completed Date Met:  08/08/15  Goal: LTG-Able to verbalize and/or display a decrease in depressive symptoms  Outcome: Completed Date Met:  08/08/15  Goal: STG-Able to verbalize suicidal ideations  Outcome: Completed Date Met:  08/08/15  Goal: STG-Able to verbalize support system  Outcome: Completed Date Met:  08/08/15  Goal: LTG-Absence of self-harm  Outcome: Completed Date Met:  08/08/15  Goal: Participation in care planning  Outcome: Completed Date Met:  08/08/15    Problem: Substance Abuse  Goal: LTG-Absence of acute withdrawl symptoms  Outcome: Completed Date Met:  08/08/15  Goal: STG-Knowledge of community resources  Outcome: Completed Date Met:  08/08/15

## 2015-08-08 NOTE — Progress Notes (Signed)
BEHAVIORAL HEALTH FOLLOW-UP NOTE     08/08/2015     Patient was seen and examined in person, Chart reviewed   Patient's case discussed with staff/team    Chief Complaint: depression    Interim History:     Patient has been feeling better. Significant progress in the symptoms since admission.  Mood better, with the score of 1/10  No AVH or paranoid thoughts  No Hopeless or worthless feeling  No active SI/HI  Pt planning to call [facility] for rehab      Appetite:  [x]  Normal  []  Increased  []  Decreased      Sleep:       [x]  Normal  []  Fair       []  Poor              Energy:    [x]  Normal  []  Increased  []  Decreased        SI []  Present  [x]  Absent    HI  [] Present  [x]  Absent     Aggression:  []  yes  []  no    Patient is [x]  able  []  unable to CONTRACT FOR SAFETY     Medication side effects(SE):  [x]  None(Psych. Meds.) []  Other      PAST MEDICAL/PSYCHIATRIC HISTORY:   Past Medical History:   Diagnosis Date   ??? CAD (coronary artery disease)    ??? Cardiac angina (HCC)    ??? CHF (congestive heart failure) (HCC)    ??? COPD (chronic obstructive pulmonary disease) (HCC)    ??? 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 GERD (gastroesophageal reflux disease)    ??? History of blood transfusion    ??? Hyperlipidemia    ??? Hypertension    ??? MI (myocardial infarction) (HCC)    ??? Neuromuscular disorder (HCC)    ??? Osteoarthritis    ??? Pneumonia    ??? Psychiatric problem    ??? Pulmonary embolism (HCC)    ??? Scoliosis        FAMILY/SOCIAL HISTORY:  Family History   Problem Relation Age of Onset   ??? Cirrhosis Mother    ??? Other Mother      Hepatitis B   ??? High Blood Pressure Father    ??? Cancer Maternal Grandmother    ??? Arthritis Maternal Grandmother    ??? Cancer Paternal Grandmother    ??? Scoliosis Paternal Grandmother    ??? High Blood Pressure Paternal Grandfather      Social History     Social History   ??? Marital status: Widowed     Spouse name: N/A   ??? Number of children: N/A   ??? Years of education: N/A     Occupational History   ??? Not on file.      Social History Main Topics   ??? Smoking status: Current Every Day Smoker     Packs/day: 0.50     Years: 38.00     Types: Cigarettes   ??? Smokeless tobacco: Not on file   ??? Alcohol use Yes      Comment: I just drink occasionally and can not stop    ??? Drug use: Yes     Special: Cocaine      Comment: States she has done it twice   ??? Sexual activity: Not Currently     Other Topics Concern   ??? Not on file     Social History Narrative  ROS:  [x]  All negative/unchanged except if checked. Explain positive(checked items) below:  []  Constitutional  []  Eyes  []  Ear/Nose/Mouth/Throat  []  Respiratory  []  CV  []  GI  []  GU  []  Musculoskeletal  []  Skin/Breast  []  Neurological  []  Endocrine  []  Heme/Lymph  []  Allergic/Immunologic    Explanation:     MEDICATIONS:    Current Facility-Administered Medications:   ???  melatonin tablet 5 mg, 5 mg, Oral, Nightly, Gillermina HuBalaji Anairis Knick, MD, 5 mg at 08/07/15 2056  ???  nicotine polacrilex (NICORETTE) gum 4 mg, 4 mg, Oral, PRN, Gillermina HuBalaji Creasie Lacosse, MD, 4 mg at 08/08/15 0732  ???  buPROPion (WELLBUTRIN XL) extended release tablet 150 mg, 150 mg, Oral, Daily, Gillermina HuBalaji Woodruff Skirvin, MD, 150 mg at 08/08/15 0732  ???  dextromethorphan-guaiFENesin (MUCINEX DM) 30-600 MG per extended release tablet 1 tablet, 1 tablet, Oral, BID PRN, Gillermina HuBalaji Kamelia Lampkins, MD, 1 tablet at 08/08/15 0905  ???  ibuprofen (ADVIL;MOTRIN) tablet 400 mg, 400 mg, Oral, Q8H PRN, Desma Paganinianiel Ionescu, MD, 400 mg at 08/06/15 2021  ???  gabapentin (NEURONTIN) capsule 800 mg, 800 mg, Oral, Nightly, Desma Paganinianiel Ionescu, MD, 800 mg at 08/07/15 2055  ???  lithium capsule 300 mg, 300 mg, Oral, BID, Desma Paganinianiel Ionescu, MD, 300 mg at 08/08/15 0732  ???  lurasidone (LATUDA) tablet 80 mg, 80 mg, Oral, Nightly, Desma Paganinianiel Ionescu, MD, 80 mg at 08/07/15 2056  ???  prazosin (MINIPRESS) capsule 5 mg, 5 mg, Oral, Nightly, Desma Paganinianiel Ionescu, MD, 5 mg at 08/07/15 2057  ???  acetaminophen (TYLENOL) tablet 650 mg, 650 mg, Oral, Q4H PRN, Desma Paganinianiel Ionescu, MD, 650 mg at 08/05/15 2127  ???  magnesium  hydroxide (MILK OF MAGNESIA) 400 MG/5ML suspension 30 mL, 30 mL, Oral, Daily PRN, Desma Paganinianiel Ionescu, MD  ???  aluminum & magnesium hydroxide-simethicone (MAALOX) 200-200-20 MG/5ML suspension 30 mL, 30 mL, Oral, PRN, Desma Paganinianiel Ionescu, MD  ???  diphenhydrAMINE (BENADRYL) capsule 100 mg, 100 mg, Oral, Nightly, Desma Paganinianiel Ionescu, MD, 100 mg at 08/07/15 2055  ???  haloperidol lactate (HALDOL) injection 5 mg, 5 mg, Intramuscular, Q6H PRN **OR** haloperidol (HALDOL) tablet 5 mg, 5 mg, Oral, Q6H PRN, Desma Paganinianiel Ionescu, MD  ???  hydrOXYzine (VISTARIL) injection 50 mg, 50 mg, Intramuscular, Q6H PRN **OR** hydrOXYzine (VISTARIL) capsule 50 mg, 50 mg, Oral, Q6H PRN, Desma Paganinianiel Ionescu, MD, 50 mg at 08/05/15 1628  ???  benzocaine-menthol (CEPACOL SORE THROAT) lozenge 1 lozenge, 1 lozenge, Oral, Q2H PRN, Gillermina HuBalaji Chella Chapdelaine, MD, 1 lozenge at 08/07/15 2213  ???  warfarin (COUMADIN) daily dosing (placeholder), , Other, RX Placeholder, Jonna CoupKafilat Kelani, APRN  ???  albuterol sulfate HFA 108 (90 Base) MCG/ACT inhaler 2 puff, 2 puff, Inhalation, Q6H PRN, Jonna CoupKafilat Kelani, APRN, 2 puff at 08/06/15 1305      Examination:  BP 101/71   Pulse 111   Temp 98 ??F (36.7 ??C) (Oral)    Resp 18   Ht 5\' 4"  (1.626 m)   Wt 178 lb (80.7 kg)   SpO2 98%   BMI 30.55 kg/m2  Gait - steady    Mental Status Examination:    Level of consciousness:  within normal limits   Appearance:  well-appearing  Behavior/Motor:  no abnormalities noted  Attitude toward examiner:  attentive and good eye contact  Speech:  spontaneous, normal rate and normal volume   Mood: euthymic  Affect:  mood congruent  Thought processes:  linear   Thought content:  Suicidal Ideation:  denies suicidal ideation  Delusions:  no evidence of delusions  Perceptual Disturbance:  denies  any perceptual disturbance  Cognition:  oriented to person, place, and time   Concentration intact  Memory intact  Insight good   Judgement fair   Fund of Knowledge adequate      ASSESSMENT:  Patient symptoms are:   Well controlled    Improving   Worsening   No change      Diagnosis:  Principal Problem:    Bipolar 2 disorder (HCC)      LABS:    No results for input(s): WBC, HGB, PLT in the last 72 hours.  No results for input(s): NA, K, CL, CO2, BUN, CREATININE, GLUCOSE in the last 72 hours.  No results for input(s): BILITOT, ALKPHOS, AST, ALT in the last 72 hours.  Lab Results   Component Value Date    LABAMPH Neg 08/04/2015    BARBSCNU Neg 08/04/2015    LABBENZ Neg 08/04/2015    LABBENZ NotDTCD 11/27/2010    OPIATESCREENURINE Neg 08/04/2015    PHENCYCLIDINESCREENURINE Neg 08/04/2015    ETOH 20 08/04/2015     Lab Results   Component Value Date    TSH 0.544 08/04/2015     Lab Results   Component Value Date    LITHIUM 0.2 (L) 08/04/2015     No results found for: VALPROATE, CBMZ    RISK ASSESSMENT: Low risk for suicide and homicide.     Treatment Plan:  Reviewed current Medications with the patient. Education provided on the complaince with treatment.  Risks, benefits, side effects, drug-to-drug interactions and alternatives to treatment were discussed.  Encourage patient to attend outpatient follow up appointment and therapy.  Discharge planning discussed with the patient and treatment team.  CD rehab on discharge    PSYCHOTHERAPY/COUNSELING:   Therapeutic interview   Supportive   CBT   Ongoing   Other      Anticipated Length of stay:Patient will be discharged home today       Electronically signed by Gillermina Hu, MD on 08/08/2015 at 9:16 AM

## 2015-08-08 NOTE — Discharge Instructions (Signed)
Continue as before admission

## 2015-08-08 NOTE — Care Coordination-Inpatient (Signed)
FAMILY COLLATERAL NOTE    Family/Support Name: Towanda Malkinlicia  Contact #: [phone]  Relationship to Pt: sister       Placed call to above with the pt's consent. Pt does not think that her daughter will call back.      Response:  Left a message requesting a return call.         Basilio CairoAshley Tesia Aisley Whan, LPC

## 2015-08-08 NOTE — Progress Notes (Signed)
Pt. declined to attend the 0900 community meeting, despite staff encouragement.Electronically signed by Mliss SaxPatricia A Lya Holben, CTRS on 08/08/2015 at 9:59 AM

## 2015-08-08 NOTE — Plan of Care (Signed)
Problem: Health Behavior:  Goal: Ability to verbalize adaptive coping strategies to use when suicidal feelings occur will improve  Ability to verbalize adaptive coping strategies to use when suicidal feelings occur will improve   Outcome: Ongoing  Goal: Ability to verbalize adaptive coping strategies to use when the urge to self-mutilate occurs will improve  Ability to verbalize adaptive coping strategies to use when the urge to self-mutilate occurs will improve   Outcome: Ongoing    Problem: Safety:  Goal: Ability to contract for his/her safety will improve  Ability to contract for his/her safety will improve   Outcome: Ongoing    Problem: Altered Mood, Depressive Behavior  Goal: LTG-Able to verbalize and/or display a decrease in depressive symptoms  Outcome: Ongoing  Goal: STG-Knowledge of positive coping patterns  Outcome: Ongoing  Goal: LTG-Able to verbalize acceptance of life and situations over which he or she has no control  Outcome: Ongoing  Goal: LTG-Able to verbalize and/or display a decrease in depressive symptoms  Outcome: Ongoing  Goal: STG-Able to verbalize suicidal ideations  Outcome: Ongoing  Goal: STG-Able to verbalize support system  Outcome: Ongoing  Goal: LTG-Absence of self-harm  Outcome: Met This Shift  Goal: Participation in care planning  Outcome: Ongoing    Problem: Substance Abuse  Goal: LTG-Absence of acute withdrawl symptoms  Outcome: Ongoing  Goal: STG-Knowledge of community resources  Outcome: Ongoing

## 2015-08-08 NOTE — Progress Notes (Signed)
Out on unit, social. Denies SI or hallucinations. States depression is better and she is [facility]. Calm, cooperative, eating, sleeping and going to group.

## 2015-08-08 NOTE — Care Coordination-Inpatient (Signed)
FAMILY COLLATERAL NOTE    Family/Support Name: Felisa BonierKathy  Contact #: [phone]  Relationship to Pt: Daughter      Placed call to above and left a message requesting a return phone call.               Uriah Trueba A. Benna DunksBarber, LISW-S

## 2015-08-09 ENCOUNTER — Ambulatory Visit: Primary: Family Medicine

## 2015-08-09 DIAGNOSIS — I2699 Other pulmonary embolism without acute cor pulmonale: Secondary | ICD-10-CM

## 2015-08-09 NOTE — Progress Notes (Signed)
Updated INR reminder date to match appt date

## 2015-08-14 ENCOUNTER — Encounter: Payer: PRIVATE HEALTH INSURANCE | Primary: Family Medicine

## 2015-08-14 ENCOUNTER — Ambulatory Visit: Primary: Family Medicine

## 2015-08-14 DIAGNOSIS — I2699 Other pulmonary embolism without acute cor pulmonale: Secondary | ICD-10-CM

## 2015-08-14 NOTE — Progress Notes (Signed)
Attempted to reach pt via phone to schedule missed appt.  No answer. Mailbox full.  Updated date of next INR to reflect when next phone call due.

## 2015-08-23 ENCOUNTER — Ambulatory Visit: Primary: Family Medicine

## 2015-08-23 DIAGNOSIS — I2699 Other pulmonary embolism without acute cor pulmonale: Secondary | ICD-10-CM

## 2015-08-23 NOTE — Progress Notes (Signed)
Attempted to reach pt by phone for 2nd overdue call-VM box full

## 2015-08-23 NOTE — Telephone Encounter (Signed)
Attempted 2nd call to pt and VM box full-unable to leave message

## 2015-08-27 ENCOUNTER — Inpatient Hospital Stay: Admit: 2015-08-27 | Discharge: 2015-08-27 | Payer: PRIVATE HEALTH INSURANCE | Primary: Family Medicine

## 2015-08-27 DIAGNOSIS — Z5181 Encounter for therapeutic drug level monitoring: Secondary | ICD-10-CM

## 2015-08-27 LAB — PROTIME-INR
INR: 1.2
Protime: 14.1 seconds

## 2015-08-27 MED ORDER — WARFARIN SODIUM 5 MG PO TABS
5 | ORAL_TABLET | ORAL | 0 refills | Status: DC
Start: 2015-08-27 — End: 2015-09-02

## 2015-08-27 NOTE — Progress Notes (Signed)
Ms. Jannet AskewKelly M Hoogendoorn is a 50 y.o. y/o female with history of PE who presents today for anticoagulation monitoring and adjustment.  INR 1.2 is sub-therapeutic for this patient (goal range 2-3) and is reflective of 35 mg TWD  Patient verifies current dosing regimen, patient able to verbally recall dose  Patient reports 3 missed doses since last INR (the patient ran out of warfarin and did not notify us until today)  Patient denies s/sx clotting and/or stroke  Patient denies hematuria, epistaxis, rectal bleeding  Patient denies changes in diet, alcohol, or tobacco use  Reviewed medication list and drug allergies with patient, updated any medication additions or modifications accordingly  Patient also denies any pending medical or dental procedures scheduled at this time  Patient was instructed to take 7.5 mg today and tomorrow only and then resume 35 mg TWD and RTC 2 weeks

## 2015-09-02 MED ORDER — WARFARIN SODIUM 5 MG PO TABS
5 | ORAL_TABLET | ORAL | 0 refills | Status: DC
Start: 2015-09-02 — End: 2015-09-10

## 2015-09-10 ENCOUNTER — Inpatient Hospital Stay
Admit: 2015-09-10 | Discharge: 2015-09-10 | Payer: PRIVATE HEALTH INSURANCE | Attending: Pharmacist | Primary: Family Medicine

## 2015-09-10 DIAGNOSIS — I2699 Other pulmonary embolism without acute cor pulmonale: Secondary | ICD-10-CM

## 2015-09-10 LAB — PROTIME-INR
INR: 1.3
Protime: 15.9 seconds

## 2015-09-10 MED ORDER — WARFARIN SODIUM 5 MG PO TABS
5 | ORAL_TABLET | ORAL | 2 refills | Status: AC
Start: 2015-09-10 — End: ?

## 2015-09-10 NOTE — Progress Notes (Signed)
Ms. MARIADELOSANGEL TRAUGER is a 50 y.o. y/o female with history of PE who presents today for anticoagulation monitoring and adjustment.  INR 1.3 is sub-therapeutic for this patient (goal range 2-3) and is reflective of undertermined mg TWD  Patient verifies current dosing regimen, patient able to verbally recall dose  Patient reports missing almost all doses since last INR, she only took 2 doses since last INR   Patient denies s/sx clotting and/or stroke  Patient denies hematuria, epistaxis, rectal bleeding  Patient denies changes in diet, alcohol, or tobacco use  Reviewed medication list and drug allergies with patient, updated any medication additions or modifications accordingly  Patient also denies any pending medical or dental procedures scheduled at this time  Patient was instructed to take 35 mg TWD and RTC 2 weeks  Patient has not been on warfarin for nearly 2 weeks now, and has problems with compliance. She claims she lost all her warfarin pills this time. Restarting previous dose to simplify dosing for patient to take 1 tablet a day and find out how 35 mg TWD affects her INR, stressed to the patient the importance of taking warfarin daily and patient verbalized understanding

## 2015-09-24 ENCOUNTER — Inpatient Hospital Stay: Payer: PRIVATE HEALTH INSURANCE | Primary: Family Medicine

## 2015-09-25 ENCOUNTER — Ambulatory Visit: Attending: Pharmacist | Primary: Family Medicine

## 2015-09-25 DIAGNOSIS — I2609 Other pulmonary embolism with acute cor pulmonale: Secondary | ICD-10-CM

## 2015-09-25 NOTE — Telephone Encounter (Signed)
Left message for patient to call and make appointment

## 2015-09-25 NOTE — Progress Notes (Signed)
Called patient and left message to call back and schedule appointment. Updated tracker for 1 week reminder

## 2015-10-02 ENCOUNTER — Ambulatory Visit: Primary: Family Medicine

## 2015-10-02 DIAGNOSIS — I2609 Other pulmonary embolism with acute cor pulmonale: Secondary | ICD-10-CM

## 2015-10-02 NOTE — Progress Notes (Signed)
Rescheduled pt from overdue list

## 2015-10-03 ENCOUNTER — Inpatient Hospital Stay: Payer: PRIVATE HEALTH INSURANCE | Primary: Family Medicine

## 2015-10-07 ENCOUNTER — Ambulatory Visit: Primary: Family Medicine

## 2015-10-07 DIAGNOSIS — I2609 Other pulmonary embolism with acute cor pulmonale: Secondary | ICD-10-CM

## 2015-10-07 NOTE — Progress Notes (Signed)
Spoke with a man stating he would give her the message to call us to reschedule later this week.  Changed INR date to reflect.   Gloriajean DellLaura Markie Frith RPh  Staff Pharmacist  10/07/2015  3:55 PM

## 2015-10-17 ENCOUNTER — Ambulatory Visit: Primary: Family Medicine

## 2015-10-17 DIAGNOSIS — I2609 Other pulmonary embolism with acute cor pulmonale: Secondary | ICD-10-CM

## 2015-10-17 NOTE — Progress Notes (Signed)
Second overdue letter sent on 10/17/15, Third letter due to be sent 10/31/15

## 2015-11-01 NOTE — Progress Notes (Signed)
Patient failed to respond to TWO phone attempts for overdue calls and TWO overdue letters  Letter of discharge sent today  Episode of care resolved

## 2016-01-08 NOTE — Telephone Encounter (Signed)
Patient contacted for new patient appointment. Consult agreement from Dr. Jenel Lucksomingo Ramos, MD. Scheduled for 01/15/16 at 1330 for 1 hour appointment.  Patient asked to bring in a list of current medications and allergies.

## 2016-01-15 ENCOUNTER — Encounter: Payer: PRIVATE HEALTH INSURANCE | Primary: Family Medicine

## 2016-02-18 ENCOUNTER — Inpatient Hospital Stay
Admission: EM | Admit: 2016-02-18 | Discharge: 2016-02-25 | Disposition: A | Discharge status: Home / Self Care | Payer: PRIVATE HEALTH INSURANCE | Source: Other Acute Inpatient Hospital | Admitting: Psychiatry

## 2016-02-18 DIAGNOSIS — F319 Bipolar disorder, unspecified: Principal | ICD-10-CM

## 2016-02-18 LAB — MICROSCOPIC URINALYSIS

## 2016-02-18 LAB — URINALYSIS WITH REFLEX TO CULTURE
Bilirubin Urine: NEGATIVE
Glucose, Ur: NEGATIVE mg/dL
Ketones, Urine: NEGATIVE mg/dL
Leukocyte Esterase, Urine: NEGATIVE
Nitrite, Urine: NEGATIVE
Protein, UA: NEGATIVE mg/dL
Specific Gravity, UA: 1.013 (ref 1.005–1.030)
Urobilinogen, Urine: 0.2 E.U./dL (ref <2.0)
pH, UA: 5.5 (ref 5.0–9.0)

## 2016-02-18 LAB — CBC WITH AUTO DIFFERENTIAL
Basophils %: 0.3 %
Basophils Absolute: 0 10*3/uL (ref 0.0–0.2)
Eosinophils %: 0.9 %
Eosinophils Absolute: 0.1 10*3/uL (ref 0.0–0.7)
Hematocrit: 39 % (ref 37.0–47.0)
Hemoglobin: 12.7 g/dL (ref 12.0–16.0)
Lymphocytes %: 23.7 %
Lymphocytes Absolute: 1.5 10*3/uL (ref 1.0–4.8)
MCH: 31.8 pg — ABNORMAL HIGH (ref 27.0–31.3)
MCHC: 32.4 % — ABNORMAL LOW (ref 33.0–37.0)
MCV: 98.1 fL (ref 82.0–100.0)
Monocytes %: 6 %
Monocytes Absolute: 0.4 10*3/uL (ref 0.2–0.8)
Neutrophils %: 69.1 %
Neutrophils Absolute: 4.4 10*3/uL (ref 1.4–6.5)
Platelets: 269 10*3/uL (ref 130–400)
RBC: 3.98 M/uL — ABNORMAL LOW (ref 4.20–5.40)
RDW: 15.1 % — ABNORMAL HIGH (ref 11.5–14.5)
WBC: 6.4 10*3/uL (ref 4.8–10.8)

## 2016-02-18 LAB — ETHANOL
Ethanol Lvl: 45 mg/dL
Ethanol percent: 0.039 G/dL

## 2016-02-18 LAB — URINE DRUG SCREEN
Amphetamine Screen, Urine: NEGATIVE (ref <1000)
Barbiturate Screen, Ur: NEGATIVE (ref <200)
Benzodiazepine Screen, Urine: NEGATIVE (ref <200)
Cannabinoid Scrn, Ur: NEGATIVE (ref <50)
Cocaine Metabolite Screen, Urine: POSITIVE — AB (ref <300)
Opiate Scrn, Ur: NEGATIVE (ref <300)
PCP Screen, Urine: NEGATIVE (ref <25)

## 2016-02-18 LAB — PREGNANCY, URINE: HCG(Urine) Pregnancy Test: NEGATIVE

## 2016-02-18 LAB — COMPREHENSIVE METABOLIC PANEL
ALT: 8 U/L (ref 0–33)
AST: 22 U/L (ref 0–35)
Albumin: 3.8 g/dL — ABNORMAL LOW (ref 3.9–4.9)
Alkaline Phosphatase: 77 U/L (ref 40–130)
Anion Gap: 12 mEq/L (ref 7–13)
BUN: 7 mg/dL (ref 6–20)
CO2: 22 mEq/L (ref 22–29)
Calcium: 8.4 mg/dL — ABNORMAL LOW (ref 8.6–10.2)
Chloride: 107 mEq/L (ref 98–107)
Creatinine: 0.75 mg/dL (ref 0.50–0.90)
GFR African American: >60 (ref >60)
GFR Non-African American: >60 (ref >60)
Globulin: 2.5 g/dL (ref 2.3–3.5)
Glucose: 85 mg/dL (ref 74–109)
Potassium: 4.2 mEq/L (ref 3.5–5.1)
Sodium: 141 mEq/L (ref 132–144)
Total Bilirubin: 0.4 mg/dL (ref 0.0–1.2)
Total Protein: 6.3 g/dL — ABNORMAL LOW (ref 6.4–8.1)

## 2016-02-18 LAB — CKMB & RELATIVE PERCENT
CK-MB Index: 0.8 % (ref 0.0–3.5)
CK-MB: 1.9 ng/mL (ref 0.0–3.8)

## 2016-02-18 LAB — CK: Total CK: 235 U/L — ABNORMAL HIGH (ref 0–170)

## 2016-02-18 LAB — TSH: TSH: 0.552 u[IU]/mL (ref 0.270–4.200)

## 2016-02-18 MED ORDER — NICOTINE POLACRILEX 2 MG MT GUM
2 MG | Freq: Once | OROMUCOSAL | Status: DC
Start: 2016-02-18 — End: 2016-02-25

## 2016-02-18 MED FILL — NICOTINE POLACRILEX 2 MG MT GUM: 2 MG | OROMUCOSAL | Qty: 1

## 2016-02-18 NOTE — ED Notes (Signed)
Sleeping @ present. Sister @ side. Dinner tray given.     Elmore Guise, RN  02/18/16 1816

## 2016-02-18 NOTE — Progress Notes (Signed)
Patient arrived to the unit via wheelchair accompanied by RN and security. Pt was asked to change into hospital pants and gown, skin and contraband check completed by this and ER RN, skin intact, no contraband found. Pt oriented to unit surroundings, policies and services, shown to room and advised of the need to complete admission assessment and sign consents, denies any needs at this time.  ??

## 2016-02-18 NOTE — ED Notes (Signed)
Lab notified for blood draw for PT.     Elmore Guise, RN  02/18/16 [phone]

## 2016-02-18 NOTE — ED Notes (Signed)
[facility]fered [facility]. Pt agreed.     Alisha RollingJeannette M Jaccob Czaplicki, RN  02/18/16 1622
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
## 2016-02-18 NOTE — ED Notes (Signed)
Per Olegario MessierKathy from  [employer] [employer], Client needs mental health services now & will follow up with her after discharge.     Elmore GuiseJanet M Denario Bagot, RN  02/18/16 1754

## 2016-02-18 NOTE — ED Notes (Signed)
Lab @ side for blood draw. Tolerated lab draw well.     Elmore GuiseJanet M Dawna Jakes, RN  02/18/16 [phone]

## 2016-02-18 NOTE — ED Notes (Signed)
Awaiting security to get belongings & to escort to [address].     Elmore GuiseJanet M Tionna Gigante, [place]  02/18/16 [phone]

## 2016-02-18 NOTE — ED Notes (Signed)
Requesting Nicotine gum. PA notified. Continues to talk with [facility] & Sister present.     Elmore Guise, RN  02/18/16 [phone]

## 2016-02-18 NOTE — ED Triage Notes (Signed)
Pt transported to ED by her sister. Pt states "I'm ready to get off this ride". Pt states she was sober for 63yrs and relapsed 1 yr ago using alcohol and cocaine/crack. Pt states I need to get into [facility]

## 2016-02-18 NOTE — ED Notes (Signed)
Lab at bedside to draw bloodwork. Urine colledcted and sent to lab     Marthenia Rolling, RN  02/18/16 [phone]

## 2016-02-18 NOTE — ED Notes (Signed)
Richard Davonna BellingKrupar PA-C at bedside to see pt     Marthenia RollingJeannette M Teddie Mehta, RN  02/18/16 [phone]
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
## 2016-02-18 NOTE — ED Notes (Signed)
Olegario MessierKathy from [employer] here to talk with Client.     Elmore GuiseJanet M Gualberto Wahlen, RN  02/18/16 [phone]
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
## 2016-02-18 NOTE — ED Notes (Signed)
Chart to Zone 1 for Dr to see pt     Marthenia RollingJeannette M Luciana Cammarata, RN  02/18/16 1559

## 2016-02-18 NOTE — ED Notes (Signed)
[facility] contacted spoke with Stacy. ETA is 30 minutes for peer support to arrive     Marthenia RollingJeannette M Ponce Skillman, [place]  02/18/16 1639

## 2016-02-19 DIAGNOSIS — F319 Bipolar disorder, unspecified: Principal | ICD-10-CM

## 2016-02-19 LAB — PROTIME-INR
INR: 1.1
Protime: 12 s (ref 8.1–13.7)

## 2016-02-19 LAB — APTT: aPTT: 23.8 s (ref 21.6–35.4)

## 2016-02-19 MED ORDER — METOPROLOL TARTRATE 25 MG PO TABS
25 MG | Freq: Two times a day (BID) | ORAL | Status: DC
Start: 2016-02-19 — End: 2016-02-25
  Administered 2016-02-19 – 2016-02-25 (×13): 25 mg via ORAL

## 2016-02-19 MED ORDER — CHLORDIAZEPOXIDE HCL 10 MG PO CAPS
10 MG | ORAL | Status: DC | PRN
Start: 2016-02-19 — End: 2016-02-21

## 2016-02-19 MED ORDER — CHLORDIAZEPOXIDE HCL 25 MG PO CAPS
25 MG | ORAL | Status: DC | PRN
Start: 2016-02-19 — End: 2016-02-21

## 2016-02-19 MED ORDER — TRAZODONE HCL 50 MG PO TABS
50 MG | Freq: Every evening | ORAL | Status: DC | PRN
Start: 2016-02-19 — End: 2016-02-25
  Administered 2016-02-20 – 2016-02-25 (×6): 50 mg via ORAL

## 2016-02-19 MED ORDER — IBUPROFEN 800 MG PO TABS
800 MG | Freq: Three times a day (TID) | ORAL | Status: DC | PRN
Start: 2016-02-19 — End: 2016-02-25
  Administered 2016-02-20 – 2016-02-25 (×8): 800 mg via ORAL

## 2016-02-19 MED ORDER — FLUTICASONE-SALMETEROL 250-50 MCG/DOSE IN AEPB
250-50 MCG/DOSE | Freq: Two times a day (BID) | RESPIRATORY_TRACT | Status: DC
Start: 2016-02-19 — End: 2016-02-19

## 2016-02-19 MED ORDER — HYDROXYZINE HCL 50 MG/ML IM SOLN
50 MG/ML | Freq: Four times a day (QID) | INTRAMUSCULAR | Status: DC | PRN
Start: 2016-02-19 — End: 2016-02-25

## 2016-02-19 MED ORDER — ATORVASTATIN CALCIUM 80 MG PO TABS
80 MG | Freq: Every evening | ORAL | Status: DC
Start: 2016-02-19 — End: 2016-02-25
  Administered 2016-02-19 – 2016-02-25 (×7): 80 mg via ORAL

## 2016-02-19 MED ORDER — TIZANIDINE HCL 4 MG PO TABS
4 MG | Freq: Three times a day (TID) | ORAL | Status: DC
Start: 2016-02-19 — End: 2016-02-25
  Administered 2016-02-19 – 2016-02-25 (×18): 4 mg via ORAL

## 2016-02-19 MED ORDER — WARFARIN DAILY DOSING BY PHARMACIST (PLACEHOLDER)
Status: DC
Start: 2016-02-19 — End: 2016-02-25

## 2016-02-19 MED ORDER — DIPHENHYDRAMINE HCL 50 MG PO CAPS
50 MG | Freq: Every evening | ORAL | Status: DC
Start: 2016-02-19 — End: 2016-02-25
  Administered 2016-02-19 – 2016-02-25 (×7): 50 mg via ORAL

## 2016-02-19 MED ORDER — PANTOPRAZOLE SODIUM 40 MG PO TBEC
40 MG | Freq: Every day | ORAL | Status: DC
Start: 2016-02-19 — End: 2016-02-25
  Administered 2016-02-19 – 2016-02-25 (×7): 40 mg via ORAL

## 2016-02-19 MED ORDER — HALOPERIDOL 5 MG PO TABS
5 MG | Freq: Four times a day (QID) | ORAL | Status: DC | PRN
Start: 2016-02-19 — End: 2016-02-25

## 2016-02-19 MED ORDER — GABAPENTIN 400 MG PO CAPS
400 MG | Freq: Every evening | ORAL | Status: DC
Start: 2016-02-19 — End: 2016-02-25
  Administered 2016-02-19 – 2016-02-25 (×7): 800 mg via ORAL

## 2016-02-19 MED ORDER — PRAZOSIN HCL 5 MG PO CAPS
5 MG | Freq: Every evening | ORAL | Status: DC
Start: 2016-02-19 — End: 2016-02-25
  Administered 2016-02-19 – 2016-02-25 (×6): 10 mg via ORAL

## 2016-02-19 MED ORDER — TAB-A-VITE PO TABS
Freq: Every day | ORAL | Status: DC
Start: 2016-02-19 — End: 2016-02-25
  Administered 2016-02-19 – 2016-02-25 (×7): 1 via ORAL

## 2016-02-19 MED ORDER — VITAMIN B-1 100 MG PO TABS
100 MG | Freq: Every day | ORAL | Status: DC
Start: 2016-02-19 — End: 2016-02-25
  Administered 2016-02-19 – 2016-02-25 (×7): 100 mg via ORAL

## 2016-02-19 MED ORDER — ALBUTEROL SULFATE HFA 108 (90 BASE) MCG/ACT IN AERS
108 (90 Base) MCG/ACT | RESPIRATORY_TRACT | Status: DC | PRN
Start: 2016-02-19 — End: 2016-02-25
  Administered 2016-02-19 – 2016-02-24 (×3): 2 via RESPIRATORY_TRACT

## 2016-02-19 MED ORDER — HALOPERIDOL LACTATE 5 MG/ML IJ SOLN
5 MG/ML | Freq: Four times a day (QID) | INTRAMUSCULAR | Status: DC | PRN
Start: 2016-02-19 — End: 2016-02-25

## 2016-02-19 MED ORDER — FOLIC ACID 1 MG PO TABS
1 MG | Freq: Every day | ORAL | Status: DC
Start: 2016-02-19 — End: 2016-02-25
  Administered 2016-02-19 – 2016-02-25 (×7): 1 mg via ORAL

## 2016-02-19 MED ORDER — WARFARIN SODIUM 2.5 MG PO TABS
2.5 MG | Freq: Every day | ORAL | Status: DC
Start: 2016-02-19 — End: 2016-02-20
  Administered 2016-02-19: 22:00:00 5 mg via ORAL

## 2016-02-19 MED ORDER — ONDANSETRON HCL 4 MG/2ML IJ SOLN
4 MG/2ML | Freq: Four times a day (QID) | INTRAMUSCULAR | Status: DC | PRN
Start: 2016-02-19 — End: 2016-02-25

## 2016-02-19 MED ORDER — LORAZEPAM 1 MG PO TABS
1 MG | ORAL | Status: DC | PRN
Start: 2016-02-19 — End: 2016-02-21

## 2016-02-19 MED ORDER — ASPIRIN 81 MG PO CHEW
81 MG | Freq: Every day | ORAL | Status: DC
Start: 2016-02-19 — End: 2016-02-25
  Administered 2016-02-19 – 2016-02-25 (×7): 81 mg via ORAL

## 2016-02-19 MED ORDER — MELATONIN 3 MG PO TABS
3 MG | Freq: Every evening | ORAL | Status: DC
Start: 2016-02-19 — End: 2016-02-25
  Administered 2016-02-19 – 2016-02-25 (×7): 5 mg via ORAL

## 2016-02-19 MED ORDER — HYDROXYZINE PAMOATE 50 MG PO CAPS
50 MG | Freq: Four times a day (QID) | ORAL | Status: DC | PRN
Start: 2016-02-19 — End: 2016-02-25
  Administered 2016-02-22 – 2016-02-25 (×6): 50 mg via ORAL

## 2016-02-19 MED ORDER — MOMETASONE FURO-FORMOTEROL FUM 200-5 MCG/ACT IN AERO
200-5 MCG/ACT | Freq: Two times a day (BID) | RESPIRATORY_TRACT | Status: DC
Start: 2016-02-19 — End: 2016-02-25
  Administered 2016-02-19 – 2016-02-25 (×13): 2 via RESPIRATORY_TRACT

## 2016-02-19 MED ORDER — OLANZAPINE 7.5 MG PO TABS
7.5 MG | Freq: Every evening | ORAL | Status: DC
Start: 2016-02-19 — End: 2016-02-25
  Administered 2016-02-19 – 2016-02-25 (×7): 15 mg via ORAL

## 2016-02-19 MED ORDER — OMEPRAZOLE 20 MG PO CPDR
20 MG | Freq: Every day | ORAL | Status: DC
Start: 2016-02-19 — End: 2016-02-19

## 2016-02-19 MED ORDER — SODIUM CHLORIDE 0.9 % IV BOLUS
0.9 % | Freq: Once | INTRAVENOUS | Status: AC
Start: 2016-02-19 — End: 2016-02-19
  Administered 2016-02-20: 02:00:00 1000 mL via INTRAVENOUS

## 2016-02-19 MED FILL — OLANZAPINE 7.5 MG PO TABS: 7.5 MG | ORAL | Qty: 2

## 2016-02-19 MED FILL — GABAPENTIN 400 MG PO CAPS: 400 MG | ORAL | Qty: 2

## 2016-02-19 MED FILL — ATORVASTATIN CALCIUM 80 MG PO TABS: 80 MG | ORAL | Qty: 1

## 2016-02-19 MED FILL — NICOTINE POLACRILEX 2 MG MT GUM: 2 MG | OROMUCOSAL | Qty: 1

## 2016-02-19 MED FILL — DIPHENHYDRAMINE HCL 50 MG PO CAPS: 50 MG | ORAL | Qty: 1

## 2016-02-19 MED FILL — PANTOPRAZOLE SODIUM 40 MG PO TBEC: 40 MG | ORAL | Qty: 1

## 2016-02-19 MED FILL — VITAMIN B-1 100 MG PO TABS: 100 MG | ORAL | Qty: 1

## 2016-02-19 MED FILL — PROVENTIL HFA 108 (90 BASE) MCG/ACT IN AERS: 108 (90 Base) MCG/ACT | RESPIRATORY_TRACT | Qty: 2.66

## 2016-02-19 MED FILL — TIZANIDINE HCL 4 MG PO TABS: 4 MG | ORAL | Qty: 1

## 2016-02-19 MED FILL — IBUPROFEN 800 MG PO TABS: 800 MG | ORAL | Qty: 1

## 2016-02-19 MED FILL — METOPROLOL TARTRATE 25 MG PO TABS: 25 MG | ORAL | Qty: 1

## 2016-02-19 MED FILL — FOLIC ACID 1 MG PO TABS: 1 MG | ORAL | Qty: 1

## 2016-02-19 MED FILL — MELATONIN 1 MG PO TABS: 1 MG | ORAL | Qty: 2

## 2016-02-19 MED FILL — THEREMS PO TABS: ORAL | Qty: 1

## 2016-02-19 MED FILL — CHILDRENS ASPIRIN 81 MG PO CHEW: 81 MG | ORAL | Qty: 1

## 2016-02-19 MED FILL — COUMADIN 2.5 MG PO TABS: 2.5 MG | ORAL | Qty: 2

## 2016-02-19 MED FILL — PRAZOSIN HCL 5 MG PO CAPS: 5 MG | ORAL | Qty: 2

## 2016-02-19 MED FILL — MOMETASONE FURO-FORMOTEROL FUM 200-5 MCG/ACT IN AERO: 200-5 MCG/ACT | RESPIRATORY_TRACT | Qty: 19.98

## 2016-02-19 NOTE — Progress Notes (Signed)
Pt. refused to attend the 1000 skills group, due to resting in room despite staff encouragement. Electronically signed by Mliss Sax, CTRS on 02/19/2016 at 12:39 PM
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
## 2016-02-19 NOTE — Progress Notes (Signed)
Pt declined admission assessment/concents, verbalizes SI without a plan or intent, contracts for safety on unit, identifies children as her protective factor. Electronically signed by Frances Maywoodamila F Gordie Belvin, RN on 02/19/16 at 1:24 AM

## 2016-02-19 NOTE — Progress Notes (Signed)
Pt reports feeling tired, has been sleeping most of the day, states she has been prompted to come out for meals, ate lunch in her room, encouraged appropriate po intake. Pt reports fleeting SI, no plan or intent, reports racing thoughts, denies Hi and AV hallucinations, no withdrawal symptom observed or reported except anxiety 7/10, pt endorses hight depression 8/10. Electronically signed by Frances Maywoodamila F Kenyen Candy, RN on 02/19/16 at 11:12 PM

## 2016-02-19 NOTE — Plan of Care (Signed)
Problem: Altered Mood, Depressive Behavior  Goal: LTG-Able to verbalize acceptance of life and situations over which he or she has no control  Outcome: Ongoing    Goal: LTG-Able to verbalize and/or display a decrease in depressive symptoms  Outcome: Ongoing    Goal: STG-Able to verbalize suicidal ideations  Outcome: Ongoing    Goal: STG-Able to verbalize support system  Outcome: Ongoing    Goal: LTG-Absence of self-harm  Outcome: Ongoing    Goal: STG-Knowledge of positive coping patterns  Outcome: Ongoing    Goal: Patient Specific Goal  Outcome: Ongoing    Goal: Participation in care planning  Outcome: Ongoing
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
## 2016-02-19 NOTE — Progress Notes (Signed)
Pt. refused to attend the 0900 community meeting due to resting in room, despite staff encouragement. Electronically signed by Mliss SaxPatricia A Yumalay Circle, CTRS on 02/19/2016 at 12:38 PM

## 2016-02-19 NOTE — Progress Notes (Signed)
Patient denies any suicidal and homicidal ideations at this time.  Is depressed about being mad at son, rates depression 8/10. Patient has anxiety rates 6/10, asked what anxious about patient said 'I don't know".

## 2016-02-19 NOTE — Progress Notes (Signed)
Clinical Pharmacy Note    Alisha Mccall is a 51 y.o. female for whom pharmacy has been asked to manage warfarin therapy.    Reason for Admission: Psych     Consulting Physician: Jennet MaduroKelsey Savage, APRN  Warfarin dose prior to admission: warfarin 5mg  PO daily (suspected non-compliance)   Warfarin Indication: history of Pulmonary Embolism   Target INR range: 2-3  Order for concomitant anticoagulant therapy: none at  this time     Outpatient warfarin provider: Dr. Ethelene Halamos     Past Medical History:   Diagnosis Date   ??? CAD (coronary artery disease)    ??? Cardiac angina (HCC)    ??? CHF (congestive heart failure) (HCC)    ??? COPD (chronic obstructive pulmonary disease) (HCC)    ??? 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 GERD (gastroesophageal reflux disease)    ??? History of blood transfusion    ??? Hyperlipidemia    ??? Hypertension    ??? MI (myocardial infarction)    ??? Neuromuscular disorder (HCC)    ??? Osteoarthritis    ??? Pneumonia    ??? Psychiatric problem    ??? Pulmonary embolism (HCC)    ??? Scoliosis                Recent Labs      02/18/16   1930   INR  1.1     Recent Labs      02/18/16   1600   HGB  12.7   HCT  39.0   PLT  269       Current warfarin drug-drug interactions: no major drug-drug interactions exist at this time     Date INR Warfarin Dose   02/19/16 1.1 (from 02/18/16)  5 mg                                      Patient likely non-compliant as INR sub-therapeutic at 1.1 on admission, goal range 2-3. As such, will continue with warfarin 5mg  daily as ordered, as this is patient's home dosage. Daily PT/INR until stable within therapeutic range. NOTE: warfarin 5mg  is ordered DAILY currently.     Thank you for the consult.    Elby ShowersErin E. Malachi Kinzler, PharmD   02/19/2016 4:31 PM
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
## 2016-02-19 NOTE — Progress Notes (Signed)
[facility] Respiratory Therapy Evaluation   Current Order: ALBUTEROL 2 PUFFS Q4 PRN    Home Regimen:PRN    Ordering Physician: Lyndee Hensen  Re-evaluation Date:  N/A     Diagnosis: MHP      Patient Status: Stable / Unstable + Physician notified    The following MDI Criteria must be met in order to convert aerosol to MDI with spacer. If unable to meet, MDI will be converted to aerosol:  '[]'$   Patient able to demonstrate the ability to use MDI effectively  '[]'$   Patient alert and cooperative  '[]'$   Patient able to take deep breath with 5-10 second hold  '[]'$   Medication(s) available in this delivery method   '[]'$   Peak flow greater than or equal to 200 ml/min            Current Order Substituted To  (same drug, same frequency)   Aerosol to MDI '[]'$  Albuterol Sulfate 0.083% unit dose by aerosol Albuterol Sulfate MDI 2 puffs by inhalation with spacer    '[]'$  Levalbuterol 1.25 mg unit dose by aerosol Levalbuterol MDI 2 puffs by inhalation with spacer    '[]'$  Levalbuterol 0.63 mg unit dose by aerosol Levalbuterol MDI 2 puffs by inhalation with spacer    '[]'$  Ipratropium Bromide 0.02% unit dose by aerosol Ipratropium Bromide MDI 2 puffs by inhalation with spacer    '[]'$  Duoneb (Ipratropium + Albuterol) unit dose by aerosol Ipratropium MDI + Albuterol MDI 2 puffs by inhalation w/spacer   MDI to Aerosol '[]'$  Albuterol Sulfate MDI Albuterol Sulfate 0.083% unit dose by aerosol    '[]'$  Levalbuterol MDI 2 puffs by inhalation Levalbuterol 1.25 mg unit dose by aerosol    '[]'$  Ipratropium Bromide MDI by inhalation Ipratropium Bromide 0.02% unit dose by aerosol    '[]'$  Combivent (Ipratropium + Albuterol) MDI by inhalation Duoneb (Ipratropium + Albuterol) unit dose by aerosol   Treatment Assessment [Frequency/Schedule]:  Change frequency to: ________________NO CHANGES__________________________________per Protocol, P&T, MEC      Points 0 '1 2 3 4   '$ Pulmonary Status  Non-Smoker  '[]'$    Smoking history   < 20 pack years  '[]'$    Smoking history  ? 20 pack years  '[]'$     Pulmonary Disorder  (acute or chronic)  '[x]'$    Severe or Chronic w/ Exacerbation  '[]'$      Surgical Status No '[x]'$    Surgeries     General '[]'$    Surgery Lower '[]'$    Abdominal Thoracic or '[]'$    Upper Abdominal Thoracic with  PulmonaryDisorder  '[]'$      Chest X-ray Clear/Not  Ordered     '[x]'$   Chronic Changes  Results Pending  '[]'$   Infiltrates, atelectasis, pleural effusion, or edema  '[]'$   Infiltrates in more than one lobe '[]'$   Infiltrate + Atelectasis, &/or pleural effusion  '[]'$     Respiratory Pattern Regular,  RR = 12-20 '[x]'$   Increased,  RR = 21-25 '[]'$   DOE, irregular,  or RR = 26-30 '[]'$   Decreased FEV1  or RR = 31-35 '[]'$   Severe SOB, use  of accessory muscles, or RR ? 35  '[]'$     Mental Status Alert, oriented,  Cooperative '[x]'$   Confused but Follows commands '[]'$   Lethargic or unable to follow commands '[]'$   Obtunded  '[]'$   Comatose  '[]'$     Breath Sounds Clear to  auscultation  '[x]'$   Decreased unilaterally or  in bases only '[]'$   Decreased  bilaterally  '[]'$   Crackles or intermittent wheezes '[]'$   Wheezes _0     Cough Strong, Spontan., & nonproductive _1   Strong,  spontaneous, &  productive _2   Weak,  Nonproductive _3   Weak, productive or  with wheezes _4   No spontaneous  cough or may require suctioning _5     Level of Activity Ambulatory _6   Ambulatory w/ Assist  _7   Non-ambulatory _8   Paraplegic _9   Quadriplegic _10     Total    Score:___3____     Triage Score:___5_____      Tri       Triage:     1. (>20) Freq: Q3    2. (16-20) Freq: Q4   3. (11-15) Freq: QID & Albuterol Q2 PRN    4. (6-10) Freq: TID & Albuterol Q2 PRN    5. (0-5) Freq Q4prn

## 2016-02-20 LAB — PROTIME-INR
INR: 1.1
Protime: 11.8 s (ref 8.1–13.7)

## 2016-02-20 LAB — CULTURE, URINE: Urine Culture, Routine: NO GROWTH

## 2016-02-20 MED ORDER — NICOTINE POLACRILEX 4 MG MT GUM
4 MG | OROMUCOSAL | Status: DC | PRN
Start: 2016-02-20 — End: 2016-02-25
  Administered 2016-02-21 – 2016-02-25 (×8): 4 mg via ORAL

## 2016-02-20 MED ORDER — OXCARBAZEPINE 150 MG PO TABS
150 MG | Freq: Two times a day (BID) | ORAL | Status: DC
Start: 2016-02-20 — End: 2016-02-25
  Administered 2016-02-20 – 2016-02-25 (×11): 150 mg via ORAL

## 2016-02-20 MED ORDER — WARFARIN SODIUM 2.5 MG PO TABS
2.5 MG | Freq: Once | ORAL | Status: AC
Start: 2016-02-20 — End: 2016-02-20
  Administered 2016-02-20: 23:00:00 7 mg via ORAL

## 2016-02-20 MED FILL — ATORVASTATIN CALCIUM 80 MG PO TABS: 80 MG | ORAL | Qty: 1

## 2016-02-20 MED FILL — COUMADIN 4 MG PO TABS: 4 MG | ORAL | Qty: 1

## 2016-02-20 MED FILL — OLANZAPINE 7.5 MG PO TABS: 7.5 MG | ORAL | Qty: 2

## 2016-02-20 MED FILL — TIZANIDINE HCL 4 MG PO TABS: 4 MG | ORAL | Qty: 1

## 2016-02-20 MED FILL — VITAMIN B-1 100 MG PO TABS: 100 MG | ORAL | Qty: 1

## 2016-02-20 MED FILL — NICOTINE POLACRILEX 2 MG MT GUM: 2 MG | OROMUCOSAL | Qty: 1

## 2016-02-20 MED FILL — IBUPROFEN 800 MG PO TABS: 800 MG | ORAL | Qty: 1

## 2016-02-20 MED FILL — OXCARBAZEPINE 150 MG PO TABS: 150 MG | ORAL | Qty: 1

## 2016-02-20 MED FILL — SODIUM CHLORIDE 0.9 % IV SOLN: 0.9 % | INTRAVENOUS | Qty: 1000

## 2016-02-20 MED FILL — MELATONIN 1 MG PO TABS: 1 MG | ORAL | Qty: 2

## 2016-02-20 MED FILL — CHILDRENS ASPIRIN 81 MG PO CHEW: 81 MG | ORAL | Qty: 1

## 2016-02-20 MED FILL — PANTOPRAZOLE SODIUM 40 MG PO TBEC: 40 MG | ORAL | Qty: 1

## 2016-02-20 MED FILL — GABAPENTIN 400 MG PO CAPS: 400 MG | ORAL | Qty: 2

## 2016-02-20 MED FILL — DIPHENHYDRAMINE HCL 50 MG PO CAPS: 50 MG | ORAL | Qty: 1

## 2016-02-20 MED FILL — FOLIC ACID 1 MG PO TABS: 1 MG | ORAL | Qty: 1

## 2016-02-20 MED FILL — THEREMS PO TABS: ORAL | Qty: 1

## 2016-02-20 MED FILL — METOPROLOL TARTRATE 25 MG PO TABS: 25 MG | ORAL | Qty: 1

## 2016-02-20 MED FILL — TRAZODONE HCL 50 MG PO TABS: 50 MG | ORAL | Qty: 1

## 2016-02-20 NOTE — Plan of Care (Signed)
Problem: Altered Mood, Depressive Behavior  Goal: LTG-Able to verbalize acceptance of life and situations over which he or she has no control  Outcome: Ongoing    Goal: LTG-Able to verbalize and/or display a decrease in depressive symptoms  Outcome: Ongoing    Goal: STG-Able to verbalize suicidal ideations  Outcome: Ongoing    Goal: STG-Able to verbalize support system  Outcome: Ongoing    Goal: LTG-Absence of self-harm  Outcome: Met This Shift    Goal: STG-Knowledge of positive coping patterns  Outcome: Ongoing    Goal: Patient Specific Goal  Outcome: Ongoing    Goal: Participation in care planning  Outcome: Ongoing

## 2016-02-20 NOTE — Behavioral Health Treatment Team (Signed)
Patient did not attend group therapy despite encouragement.

## 2016-02-20 NOTE — Plan of Care (Signed)
Problem: Altered Mood, Depressive Behavior  Goal: LTG-Able to verbalize acceptance of life and situations over which he or she has no control  Outcome: Ongoing    Goal: LTG-Able to verbalize and/or display a decrease in depressive symptoms  Outcome: Ongoing    Goal: STG-Able to verbalize suicidal ideations  Outcome: Ongoing    Goal: STG-Able to verbalize support system  Outcome: Ongoing    Goal: LTG-Absence of self-harm  Outcome: Ongoing    Goal: STG-Knowledge of positive coping patterns  Outcome: Ongoing    Goal: Patient Specific Goal  Outcome: Ongoing    Goal: Participation in care planning  Outcome: Ongoing

## 2016-02-20 NOTE — Progress Notes (Signed)
Pt. attended the 0900 community meeting.Electronically signed by Tressie Ellis, RAC on 02/20/2016 at 1:53 PM

## 2016-02-20 NOTE — Progress Notes (Signed)
Clinical Pharmacy Note    Warfarin consult follow-up    Recent Labs      02/20/16   0924   INR  1.1     Recent Labs      02/18/16   1600   HGB  12.7   HCT  39.0   PLT  269       Significant drug:drug interactions:  Trileptal, Lipitor, gabapentin, Trazodone     Notes:  Date INR Warfarin Dose   02/19/16 1.1 (from 02/18/16)  5 mg    02/20/16 1.1 7 mg                                                INR remains sub-therapeutic at 1.1 today, goal INR is 2-3. As such, will give booster dose today of warfarin 7mg  X 1. DC'd orders for warfarin 5mg  daily. Continue with daily PT/INR until stable within therapeutic range.    Elby ShowersErin E. Genelle Economou, PharmD   02/20/2016 3:34 PM

## 2016-02-20 NOTE — Progress Notes (Signed)
BEHAVIORAL HEALTH FOLLOW-UP NOTE     02/21/2016     Patient was seen and examined in person, Chart reviewed   Patient's case discussed with staff/team    Chief Complaint: depression    Interim History:     Pt continue to remain depressed  Out of her room and interacting with others  Hopeless and worthless feeling'  Pt is c/o racing thoughts  Less irritable but anxious about her situation  Appetite:   [x]  Normal/Unchanged  []  Increased  []  Decreased      Sleep:       []  Normal/Unchanged  [x]  Fair       []  Poor              Energy:    []  Normal/Unchanged  []  Increased  [x]  Decreased        SI [x]  Present  []  Absent    HI  [] Present  [x]  Absent     Aggression:  []  yes  [x]  no    Patient is [x]  able  []  unable to CONTRACT FOR SAFETY     PAST MEDICAL/PSYCHIATRIC HISTORY:   Past Medical History:   Diagnosis Date   . CAD (coronary artery disease)    . Cardiac angina (HCC)    . CHF (congestive heart failure) (HCC)    . COPD (chronic obstructive pulmonary disease) (HCC)    . Disease of blood and blood forming organ    . GERD (gastroesophageal reflux disease)    . History of blood transfusion    . Hyperlipidemia    . Hypertension    . MI (myocardial infarction)    . Neuromuscular disorder (HCC)    . Osteoarthritis    . Pneumonia    . Psychiatric problem    . Pulmonary embolism (HCC)    . Scoliosis        FAMILY/SOCIAL HISTORY:  Family History   Problem Relation Age of Onset   . Cirrhosis Mother    . Other Mother      Hepatitis B   . High Blood Pressure Father    . Cancer Maternal Grandmother    . Arthritis Maternal Grandmother    . Cancer Paternal Grandmother    . Scoliosis Paternal Grandmother    . High Blood Pressure Paternal Grandfather      Social History     Social History   . Marital status: Single     Spouse name: N/A   . Number of children: N/A   . Years of education: N/A     Occupational History   . Not on file.     Social History Main Topics   . Smoking status: Current Every Day Smoker     Packs/day: 0.50     Years:  38.00     Types: Cigarettes   . Smokeless tobacco: Not on file   . Alcohol use Yes      Comment: 3-4x week   . Drug use: Yes     Types: Cocaine      Comment: 3-4x/week   . Sexual activity: Not Currently     Other Topics Concern   . Not on file     Social History Narrative   . No narrative on file           ROS:  [x]  All negative/unchanged except if checked. Explain positive(checked items) below:  []  Constitutional  []  Eyes  []  Ear/Nose/Mouth/Throat  []  Respiratory  []  CV  []  GI  []   GU  []  Musculoskeletal  []  Skin/Breast  []  Neurological  []  Endocrine  []  Heme/Lymph  []  Allergic/Immunologic    Explanation:     MEDICATIONS:    Current Facility-Administered Medications:   .  warfarin (COUMADIN) tablet 7 mg, 7 mg, Oral, Once, Alveta Heimlich, APRN  .  OXcarbazepine (TRILEPTAL) tablet 150 mg, 150 mg, Oral, BID, Gillermina Hu, MD, 150 mg at 02/21/16 0905  .  nicotine polacrilex (NICORETTE) gum 4 mg, 4 mg, Oral, PRN, Gillermina Hu, MD, 4 mg at 02/21/16 0907  .  albuterol sulfate HFA 108 (90 Base) MCG/ACT inhaler 2 puff, 2 puff, Inhalation, Q4H PRN, Desma Paganini, MD, 2 puff at 02/19/16 0818  .  aspirin chewable tablet 81 mg, 81 mg, Oral, Daily, Desma Paganini, MD, 81 mg at 02/21/16 0905  .  atorvastatin (LIPITOR) tablet 80 mg, 80 mg, Oral, Nightly, Desma Paganini, MD, 80 mg at 02/20/16 2054  .  diphenhydrAMINE (BENADRYL) capsule 50 mg, 50 mg, Oral, Nightly, Desma Paganini, MD, 50 mg at 02/20/16 2053  .  folic acid (FOLVITE) tablet 1 mg, 1 mg, Oral, Daily, Desma Paganini, MD, 1 mg at 02/21/16 0905  .  gabapentin (NEURONTIN) capsule 800 mg, 800 mg, Oral, Nightly, Desma Paganini, MD, 800 mg at 02/20/16 2054  .  ibuprofen (ADVIL;MOTRIN) tablet 800 mg, 800 mg, Oral, Q8H PRN, Desma Paganini, MD, 800 mg at 02/20/16 2059  .  melatonin tablet 5 mg, 5 mg, Oral, Nightly, Desma Paganini, MD, 5 mg at 02/20/16 2054  .  metoprolol tartrate (LOPRESSOR) tablet 25 mg, 25 mg, Oral, BID, Desma Paganini, MD, 25 mg at 02/21/16 0905  .   OLANZapine (ZYPREXA) tablet 15 mg, 15 mg, Oral, Nightly, Desma Paganini, MD, 15 mg at 02/20/16 2055  .  prazosin (MINIPRESS) capsule 10 mg, 10 mg, Oral, Nightly, Desma Paganini, MD, 10 mg at 02/20/16 2053  .  tiZANidine (ZANAFLEX) tablet 4 mg, 4 mg, Oral, TID, Desma Paganini, MD, 4 mg at 02/21/16 0905  .  ondansetron (ZOFRAN) injection 4 mg, 4 mg, Intravenous, Q6H PRN, Desma Paganini, MD  .  vitamin B-1 (THIAMINE) tablet 100 mg, 100 mg, Oral, Daily, Desma Paganini, MD, 100 mg at 02/21/16 0905  .  multivitamin 1 tablet, 1 tablet, Oral, Daily, Desma Paganini, MD, 1 tablet at 02/21/16 [phone]  .  hydrOXYzine (VISTARIL) injection 50 mg, 50 mg, Intramuscular, Q6H PRN **OR** hydrOXYzine (VISTARIL) capsule 50 mg, 50 mg, Oral, Q6H PRN, Desma Paganini, MD  .  haloperidol (HALDOL) tablet 5 mg, 5 mg, Oral, Q6H PRN **OR** haloperidol lactate (HALDOL) injection 5 mg, 5 mg, Intramuscular, Q6H PRN, Desma Paganini, MD  .  traZODone (DESYREL) tablet 50 mg, 50 mg, Oral, Nightly PRN, Desma Paganini, MD, 50 mg at 02/20/16 2055  .  chlordiazePOXIDE (LIBRIUM) capsule 10 mg, 10 mg, Oral, Q4H PRN **OR** chlordiazePOXIDE (LIBRIUM) capsule 25 mg, 25 mg, Oral, Q4H PRN **OR** chlordiazePOXIDE (LIBRIUM) capsule 50 mg, 50 mg, Oral, Q2H PRN **OR** chlordiazePOXIDE (LIBRIUM) capsule 75 mg, 75 mg, Oral, Q1H PRN **OR** LORazepam (ATIVAN) tablet 4 mg, 4 mg, Oral, Q1H PRN, Desma Paganini, MD  .  mometasone-formoterol [facility]) 200-5 MCG/ACT inhaler 2 puff, 2 puff, Inhalation, BID, Desma Paganini, MD, 2 puff at 02/21/16 [phone]  .  pantoprazole (PROTONIX) tablet 40 mg, 40 mg, Oral, QAM AC, Desma Paganini, MD, 40 mg at 02/21/16 [phone]  .  warfarin (COUMADIN) daily dosing (placeholder), , Other, RX Placeholder, Alveta Heimlich, APRN  .  nicotine polacrilex (NICORETTE) gum 2 mg,  2 mg, Oral, Once, Lilly Cove, PA-C      Examination:  BP (!) 140/88   Pulse 89   Temp 98 F (36.7 C) (Oral)   Resp 20   Ht 5\' 4"  (1.626 m)   Wt 150 lb (68 kg)   SpO2 99%    BMI 25.75 kg/m   Gait - steady  Medication side effects(SE): no    Mental Status Examination:    Level of consciousness:  within normal limits   Appearance:  poor grooming and fair hygiene  Behavior/Motor:  psychomotor retardation  Attitude toward examiner:  cooperative  Speech:  slow   Mood: decreased range and depressed  Affect:  mood congruent  Thought processes:  linear and goal directed   Thought content:  Suicidal Ideation:  passive  Delusions:  no evidence of delusions  Perceptual Disturbance:  denies any perceptual disturbance  Cognition:  oriented to person, place, and time   Concentration poor  Insight poor   Judgement poor     ASSESSMENT:   Patient symptoms are:  []  Well controlled  []  Improving  []  Worsening  [x]  No change      Diagnosis:   Principal Problem:    Bipolar depression (HCC)  Active Problems:    Cocaine use disorder, severe, dependence (HCC)      LABS:    Recent Labs      02/18/16   1600   WBC  6.4   HGB  12.7   PLT  269     Recent Labs      02/18/16   1600   NA  141   K  4.2   CL  107   CO2  22   BUN  7   CREATININE  0.75   GLUCOSE  85     Recent Labs      02/18/16   1600   BILITOT  0.4   ALKPHOS  77   AST  22   ALT  8     Lab Results   Component Value Date    LABAMPH Neg 02/18/2016    BARBSCNU Neg 02/18/2016    LABBENZ Neg 02/18/2016    LABBENZ NotDTCD 11/27/2010    OPIATESCREENURINE Neg 02/18/2016    PHENCYCLIDINESCREENURINE Neg 02/18/2016    ETOH 45 02/18/2016     Lab Results   Component Value Date    TSH 0.552 02/18/2016     Lab Results   Component Value Date    LITHIUM 0.2 (L) 08/04/2015     No results found for: VALPROATE, CBMZ      Treatment Plan:  Reviewed current Medications with the patient.   Trileptal started today  Continue zyprexa  Risks, benefits, side effects, drug-to-drug interactions and alternatives to treatment were discussed.  Collateral information: pending from sister - [phone]  CD evaluation  Encourage patient to attend group and other milieu  activities.  Discharge planning discussed with the patient and treatment team.    PSYCHOTHERAPY/COUNSELING:  [x]  Therapeutic interview  [x]  Supportive  []  CBT  []  Ongoing  []  Other    [x]  Patient continues to need, on a daily basis, active treatment furnished directly by or requiring the supervision of inpatient psychiatric personnel     Anticipated Length of stay:          Electronically signed by Gillermina Hu, MD on 02/21/2016 at 11:53 AM

## 2016-02-20 NOTE — Behavioral Health Treatment Team (Signed)
Pt did not attend Recreation group at 1900.    Electronically signed by Lorraine Terriquez on 02/20/2016 at 10:58 PM

## 2016-02-20 NOTE — Progress Notes (Signed)
Out on unit a lot of the day. Denies SI, depression or hallucinations. This afternoon Pt stated she is starting feel irritable and starting to crave drugs.

## 2016-02-20 NOTE — Progress Notes (Signed)
Group Therapy Note    Date: 02/20/2016  Start Time: 1000  End Time:  1100  Number of Participants: 13    Type of Group: Psychoeducation    Wellness Binder Information  Module Name:    Session Number:      Patient's Goal:  "To not be depressed"    Notes:  Patient was talkative, expressive and work well on her project in her group.    Status After Intervention:  Unchanged    Participation Level: Active Listener    Participation Quality: Appropriate and Attentive      Speech:  normal      Thought Process/Content: Logical  Linear      Affective Functioning: Congruent      Mood: good      Level of consciousness:  Alert, Oriented x4 and Attentive      Response to Learning: Able to verbalize current knowledge/experience      Endings: None Reported    Modes of Intervention: Education, Socialization and Activity      Discipline Responsible: Psychoeducational Specialist      Signature:  Tressie EllisMaria Lucerito Rosinski

## 2016-02-21 LAB — PROTIME-INR
INR: 1.2
Protime: 12.2 s (ref 8.1–13.7)

## 2016-02-21 MED ORDER — WARFARIN SODIUM 2.5 MG PO TABS
2.5 MG | Freq: Once | ORAL | Status: AC
Start: 2016-02-21 — End: 2016-02-21
  Administered 2016-02-21: 22:00:00 7 mg via ORAL

## 2016-02-21 MED FILL — VITAMIN B-1 100 MG PO TABS: 100 MG | ORAL | Qty: 1

## 2016-02-21 MED FILL — NICORELIEF 4 MG MT GUM: 4 MG | OROMUCOSAL | Qty: 1

## 2016-02-21 MED FILL — COUMADIN 4 MG PO TABS: 4 MG | ORAL | Qty: 1

## 2016-02-21 MED FILL — TIZANIDINE HCL 4 MG PO TABS: 4 MG | ORAL | Qty: 1

## 2016-02-21 MED FILL — OLANZAPINE 7.5 MG PO TABS: 7.5 MG | ORAL | Qty: 2

## 2016-02-21 MED FILL — ATORVASTATIN CALCIUM 80 MG PO TABS: 80 MG | ORAL | Qty: 1

## 2016-02-21 MED FILL — PANTOPRAZOLE SODIUM 40 MG PO TBEC: 40 MG | ORAL | Qty: 1

## 2016-02-21 MED FILL — METOPROLOL TARTRATE 25 MG PO TABS: 25 MG | ORAL | Qty: 1

## 2016-02-21 MED FILL — IBUPROFEN 800 MG PO TABS: 800 MG | ORAL | Qty: 1

## 2016-02-21 MED FILL — MELATONIN 1 MG PO TABS: 1 MG | ORAL | Qty: 2

## 2016-02-21 MED FILL — OXCARBAZEPINE 150 MG PO TABS: 150 MG | ORAL | Qty: 1

## 2016-02-21 MED FILL — FOLIC ACID 1 MG PO TABS: 1 MG | ORAL | Qty: 1

## 2016-02-21 MED FILL — TRAZODONE HCL 50 MG PO TABS: 50 MG | ORAL | Qty: 1

## 2016-02-21 MED FILL — GABAPENTIN 400 MG PO CAPS: 400 MG | ORAL | Qty: 2

## 2016-02-21 MED FILL — PRAZOSIN HCL 5 MG PO CAPS: 5 MG | ORAL | Qty: 2

## 2016-02-21 MED FILL — THEREMS PO TABS: ORAL | Qty: 1

## 2016-02-21 MED FILL — DIPHENHYDRAMINE HCL 50 MG PO CAPS: 50 MG | ORAL | Qty: 1

## 2016-02-21 MED FILL — CHILDRENS ASPIRIN 81 MG PO CHEW: 81 MG | ORAL | Qty: 1

## 2016-02-21 NOTE — Progress Notes (Signed)
Group Therapy Note    Date: 02/21/2016  Start Time: 1100  End Time:  1145  Number of Participants: 6    Type of Group: Psychoeducation    Wellness Binder Information  Module Name:    Session Number:      Patient's Goal:  "To not be depress"    Notes:  Patient work diligently on her project and she felt much pride with her finish task.    Status After Intervention:  Unchanged    Participation Level: Active Listener    Participation Quality: Appropriate and Attentive      Speech:  normal       Thought Process/Content: Linear      Affective Functioning: Flat      Mood: calm      Level of consciousness:  Alert      Response to Learning: Able to retain information and Progressing to goal      Endings: None Reported    Modes of Intervention: Education, Socialization and Activity      Discipline Responsible: Psychoeducational Specialist      Signature:  Tressie EllisMaria Loudon Krakow

## 2016-02-21 NOTE — Progress Notes (Signed)
Pt. refused to attend the 0900 community meeting due to resting in room, despite staff encouragement.Electronically signed by Makalah Asberry, RAC on 02/21/2016 at 10:35 AM

## 2016-02-21 NOTE — Behavioral Health Treatment Team (Signed)
Did not attend group therapy from 1000-1045.

## 2016-02-21 NOTE — Telephone Encounter (Signed)
Patient no called no showed for new patient intake appointment. Patient phone number no longer working. Patient has not been accepted as [facility] patient.  Please be advised that there has been no INR follow-up [facility]    If you wish patient to be [facility], please contact patient and have them contact us ASAP. We cannot follow non-compliant patients.

## 2016-02-21 NOTE — Progress Notes (Addendum)
BEHAVIORAL HEALTH FOLLOW-UP NOTE     02/21/2016     Patient was seen and examined in person, Chart reviewed   Patient's case discussed with staff/team    Chief Complaint: depression    Interim History:     No major change in presentation  Still c/o racing thoughts and anger irritability feeling  Has been attending groups and activities  Depressed mood with hopeless feeling    Appetite:   [x]  Normal/Unchanged  []  Increased  []  Decreased      Sleep:       []  Normal/Unchanged  [x]  Fair       []  Poor              Energy:    []  Normal/Unchanged  []  Increased  [x]  Decreased        SI [x]  Present  []  Absent    HI  [] Present  [x]  Absent     Aggression:  []  yes  [x]  no    Patient is [x]  able  []  unable to CONTRACT FOR SAFETY     PAST MEDICAL/PSYCHIATRIC HISTORY:   Past Medical History:   Diagnosis Date   . CAD (coronary artery disease)    . Cardiac angina (HCC)    . CHF (congestive heart failure) (HCC)    . COPD (chronic obstructive pulmonary disease) (HCC)    . Disease of blood and blood forming organ    . GERD (gastroesophageal reflux disease)    . History of blood transfusion    . Hyperlipidemia    . Hypertension    . MI (myocardial infarction)    . Neuromuscular disorder (HCC)    . Osteoarthritis    . Pneumonia    . Psychiatric problem    . Pulmonary embolism (HCC)    . Scoliosis        FAMILY/SOCIAL HISTORY:  Family History   Problem Relation Age of Onset   . Cirrhosis Mother    . Other Mother      Hepatitis B   . High Blood Pressure Father    . Cancer Maternal Grandmother    . Arthritis Maternal Grandmother    . Cancer Paternal Grandmother    . Scoliosis Paternal Grandmother    . High Blood Pressure Paternal Grandfather      Social History     Social History   . Marital status: Single     Spouse name: N/A   . Number of children: N/A   . Years of education: N/A     Occupational History   . Not on file.     Social History Main Topics   . Smoking status: Current Every Day Smoker     Packs/day: 0.50     Years: 38.00      Types: Cigarettes   . Smokeless tobacco: Not on file   . Alcohol use Yes      Comment: 3-4x week   . Drug use: Yes     Types: Cocaine      Comment: 3-4x/week   . Sexual activity: Not Currently     Other Topics Concern   . Not on file     Social History Narrative   . No narrative on file           ROS:  [x]  All negative/unchanged except if checked. Explain positive(checked items) below:  []  Constitutional  []  Eyes  []  Ear/Nose/Mouth/Throat  []  Respiratory  []  CV  []  GI  []  GU  []  Musculoskeletal  []   Skin/Breast  []  Neurological  []  Endocrine  []  Heme/Lymph  []  Allergic/Immunologic    Explanation:     MEDICATIONS:    Current Facility-Administered Medications:   .  warfarin (COUMADIN) tablet 7 mg, 7 mg, Oral, Once, Alveta Heimlich, APRN  .  OXcarbazepine (TRILEPTAL) tablet 150 mg, 150 mg, Oral, BID, Gillermina Hu, MD, 150 mg at 02/21/16 0905  .  nicotine polacrilex (NICORETTE) gum 4 mg, 4 mg, Oral, PRN, Gillermina Hu, MD, 4 mg at 02/21/16 0907  .  albuterol sulfate HFA 108 (90 Base) MCG/ACT inhaler 2 puff, 2 puff, Inhalation, Q4H PRN, Desma Paganini, MD, 2 puff at 02/19/16 0818  .  aspirin chewable tablet 81 mg, 81 mg, Oral, Daily, Desma Paganini, MD, 81 mg at 02/21/16 0905  .  atorvastatin (LIPITOR) tablet 80 mg, 80 mg, Oral, Nightly, Desma Paganini, MD, 80 mg at 02/20/16 2054  .  diphenhydrAMINE (BENADRYL) capsule 50 mg, 50 mg, Oral, Nightly, Desma Paganini, MD, 50 mg at 02/20/16 2053  .  folic acid (FOLVITE) tablet 1 mg, 1 mg, Oral, Daily, Desma Paganini, MD, 1 mg at 02/21/16 0905  .  gabapentin (NEURONTIN) capsule 800 mg, 800 mg, Oral, Nightly, Desma Paganini, MD, 800 mg at 02/20/16 2054  .  ibuprofen (ADVIL;MOTRIN) tablet 800 mg, 800 mg, Oral, Q8H PRN, Desma Paganini, MD, 800 mg at 02/20/16 2059  .  melatonin tablet 5 mg, 5 mg, Oral, Nightly, Desma Paganini, MD, 5 mg at 02/20/16 2054  .  metoprolol tartrate (LOPRESSOR) tablet 25 mg, 25 mg, Oral, BID, Desma Paganini, MD, 25 mg at 02/21/16 0905  .  OLANZapine  (ZYPREXA) tablet 15 mg, 15 mg, Oral, Nightly, Desma Paganini, MD, 15 mg at 02/20/16 2055  .  prazosin (MINIPRESS) capsule 10 mg, 10 mg, Oral, Nightly, Desma Paganini, MD, 10 mg at 02/20/16 2053  .  tiZANidine (ZANAFLEX) tablet 4 mg, 4 mg, Oral, TID, Desma Paganini, MD, 4 mg at 02/21/16 0905  .  ondansetron (ZOFRAN) injection 4 mg, 4 mg, Intravenous, Q6H PRN, Desma Paganini, MD  .  vitamin B-1 (THIAMINE) tablet 100 mg, 100 mg, Oral, Daily, Desma Paganini, MD, 100 mg at 02/21/16 0905  .  multivitamin 1 tablet, 1 tablet, Oral, Daily, Desma Paganini, MD, 1 tablet at 02/21/16 [phone]  .  hydrOXYzine (VISTARIL) injection 50 mg, 50 mg, Intramuscular, Q6H PRN **OR** hydrOXYzine (VISTARIL) capsule 50 mg, 50 mg, Oral, Q6H PRN, Desma Paganini, MD  .  haloperidol (HALDOL) tablet 5 mg, 5 mg, Oral, Q6H PRN **OR** haloperidol lactate (HALDOL) injection 5 mg, 5 mg, Intramuscular, Q6H PRN, Desma Paganini, MD  .  traZODone (DESYREL) tablet 50 mg, 50 mg, Oral, Nightly PRN, Desma Paganini, MD, 50 mg at 02/20/16 2055  .  chlordiazePOXIDE (LIBRIUM) capsule 10 mg, 10 mg, Oral, Q4H PRN **OR** chlordiazePOXIDE (LIBRIUM) capsule 25 mg, 25 mg, Oral, Q4H PRN **OR** chlordiazePOXIDE (LIBRIUM) capsule 50 mg, 50 mg, Oral, Q2H PRN **OR** chlordiazePOXIDE (LIBRIUM) capsule 75 mg, 75 mg, Oral, Q1H PRN **OR** LORazepam (ATIVAN) tablet 4 mg, 4 mg, Oral, Q1H PRN, Desma Paganini, MD  .  mometasone-formoterol [facility]) 200-5 MCG/ACT inhaler 2 puff, 2 puff, Inhalation, BID, Desma Paganini, MD, 2 puff at 02/21/16 [phone]  .  pantoprazole (PROTONIX) tablet 40 mg, 40 mg, Oral, QAM AC, Desma Paganini, MD, 40 mg at 02/21/16 [phone]  .  warfarin (COUMADIN) daily dosing (placeholder), , Other, RX Placeholder, Alveta Heimlich, APRN  .  nicotine polacrilex (NICORETTE) gum 2 mg, 2 mg, Oral, Once, Herschell Dimes  Davonna Belling, PA-C      Examination:  BP (!) 140/88   Pulse 89   Temp 98 F (36.7 C) (Oral)   Resp 20   Ht 5\' 4"  (1.626 m)   Wt 150 lb (68 kg)   SpO2 99%   BMI 25.75  kg/m   Gait - steady  Medication side effects(SE): no    Mental Status Examination:    Level of consciousness:  within normal limits   Appearance:  poor grooming and fair hygiene  Behavior/Motor:  psychomotor retardation  Attitude toward examiner:  cooperative  Speech:  slow   Mood: decreased range and depressed  Affect:  mood congruent  Thought processes:  linear and goal directed   Thought content:  Suicidal Ideation:  passive  Delusions:  no evidence of delusions  Perceptual Disturbance:  denies any perceptual disturbance  Cognition:  oriented to person, place, and time   Concentration poor  Insight poor   Judgement poor     ASSESSMENT:   Patient symptoms are:  []  Well controlled  []  Improving  []  Worsening  [x]  No change      Diagnosis:   Principal Problem:    Bipolar depression (HCC)  Active Problems:    Cocaine use disorder, severe, dependence (HCC)      LABS:    Recent Labs      02/18/16   1600   WBC  6.4   HGB  12.7   PLT  269     Recent Labs      02/18/16   1600   NA  141   K  4.2   CL  107   CO2  22   BUN  7   CREATININE  0.75   GLUCOSE  85     Recent Labs      02/18/16   1600   BILITOT  0.4   ALKPHOS  77   AST  22   ALT  8     Lab Results   Component Value Date    LABAMPH Neg 02/18/2016    BARBSCNU Neg 02/18/2016    LABBENZ Neg 02/18/2016    LABBENZ NotDTCD 11/27/2010    OPIATESCREENURINE Neg 02/18/2016    PHENCYCLIDINESCREENURINE Neg 02/18/2016    ETOH 45 02/18/2016     Lab Results   Component Value Date    TSH 0.552 02/18/2016     Lab Results   Component Value Date    LITHIUM 0.2 (L) 08/04/2015     No results found for: VALPROATE, CBMZ      Treatment Plan:  Reviewed current Medications with the patient.   Trileptal - tolerating well. Adjust the dose of medication if needed  Risks, benefits, side effects, drug-to-drug interactions and alternatives to treatment were discussed.  Collateral information: pending from sister - [phone]  CD evaluation  Encourage patient to attend group and other milieu  activities.  Discharge planning discussed with the patient and treatment team.    PSYCHOTHERAPY/COUNSELING:  [x]  Therapeutic interview  [x]  Supportive  []  CBT  []  Ongoing  []  Other  Patient was seen 1:1 for 20 minutes, other than E&M time spent, focusing on      - coping skills techniques     - Anxiety management techniques discussed including deep breathing exercise and PMR     - discussing patients strength and weakness      - Motivational interviewing to assess the stage of change and assessing patient readiness to quit substance use.       [  x] Patient continues to need, on a daily basis, active treatment furnished directly by or requiring the supervision of inpatient psychiatric personnel     Anticipated Length of stay:          Electronically signed by Gillermina Hu, MD on 02/21/2016 at 11:53 AM

## 2016-02-21 NOTE — Plan of Care (Signed)
Problem: Altered Mood, Depressive Behavior  Goal: LTG-Able to verbalize acceptance of life and situations over which he or she has no control  Outcome: Ongoing    Goal: LTG-Able to verbalize and/or display a decrease in depressive symptoms  Outcome: Ongoing    Goal: STG-Able to verbalize suicidal ideations  Outcome: Ongoing    Goal: STG-Able to verbalize support system  Outcome: Ongoing    Goal: LTG-Absence of self-harm  Outcome: Ongoing    Goal: STG-Knowledge of positive coping patterns  Outcome: Ongoing    Goal: Patient Specific Goal  Outcome: Ongoing    Goal: Participation in care planning  Outcome: Ongoing

## 2016-02-21 NOTE — Progress Notes (Signed)
Clinical Pharmacy Note    Warfarin consult follow-up    Recent Labs      02/21/16   0622   INR  1.2     Recent Labs      02/18/16   1600   HGB  12.7   HCT  39.0   PLT  269       Significant drug:drug interactions:  Trileptal, Lipitor, gabapentin, Trazodone     Notes:  Date INR Warfarin Dose   02/19/16 1.1 (from 02/18/16)  5 mg    02/20/16 1.1 7 mg    02/21/16 1.2 7 mg                                        INR increased slightly from 1.1 to 1.2 today. As such, will repeat dose of warfarin 7mg  today only (home dose is warfarin 5mg ). Continue with daily PT/INR until stable within therapeutic range.    Elby ShowersErin E. Ellie Bryand, PharmD   02/21/2016 11:09 AM

## 2016-02-21 NOTE — Progress Notes (Signed)
Pt denies any current suicidal ideation, homicidal ideation or hallucinations.     Pt out on unit, can be irritable, not interested in speaking with staff unless she needs something.    Voices no complaints at this time.

## 2016-02-21 NOTE — Progress Notes (Signed)
Out on unit all morning. Calm, eating and sleeping. Fleeting SI in AM with no plan, denies those thoughts this afternoon. States still having some depression. Scoring zero on CIWA.

## 2016-02-22 LAB — PROTIME-INR
INR: 1.3
Protime: 14 s — ABNORMAL HIGH (ref 8.1–13.7)

## 2016-02-22 MED ORDER — WARFARIN SODIUM 4 MG PO TABS
4 MG | Freq: Once | ORAL | Status: AC
Start: 2016-02-22 — End: 2016-02-22
  Administered 2016-02-22: 23:00:00 7 mg via ORAL

## 2016-02-22 MED FILL — IBUPROFEN 800 MG PO TABS: 800 MG | ORAL | Qty: 1

## 2016-02-22 MED FILL — PANTOPRAZOLE SODIUM 40 MG PO TBEC: 40 MG | ORAL | Qty: 1

## 2016-02-22 MED FILL — METOPROLOL TARTRATE 25 MG PO TABS: 25 MG | ORAL | Qty: 1

## 2016-02-22 MED FILL — COUMADIN 4 MG PO TABS: 4 MG | ORAL | Qty: 2

## 2016-02-22 MED FILL — THEREMS PO TABS: ORAL | Qty: 1

## 2016-02-22 MED FILL — MELATONIN 1 MG PO TABS: 1 MG | ORAL | Qty: 2

## 2016-02-22 MED FILL — TIZANIDINE HCL 4 MG PO TABS: 4 MG | ORAL | Qty: 1

## 2016-02-22 MED FILL — PRAZOSIN HCL 5 MG PO CAPS: 5 MG | ORAL | Qty: 2

## 2016-02-22 MED FILL — OXCARBAZEPINE 150 MG PO TABS: 150 MG | ORAL | Qty: 1

## 2016-02-22 MED FILL — CHILDRENS ASPIRIN 81 MG PO CHEW: 81 MG | ORAL | Qty: 1

## 2016-02-22 MED FILL — HYDROXYZINE PAMOATE 50 MG PO CAPS: 50 MG | ORAL | Qty: 1

## 2016-02-22 MED FILL — NICORELIEF 4 MG MT GUM: 4 MG | OROMUCOSAL | Qty: 1

## 2016-02-22 MED FILL — OLANZAPINE 7.5 MG PO TABS: 7.5 MG | ORAL | Qty: 2

## 2016-02-22 MED FILL — ATORVASTATIN CALCIUM 80 MG PO TABS: 80 MG | ORAL | Qty: 1

## 2016-02-22 MED FILL — DIPHENHYDRAMINE HCL 50 MG PO CAPS: 50 MG | ORAL | Qty: 1

## 2016-02-22 MED FILL — FOLIC ACID 1 MG PO TABS: 1 MG | ORAL | Qty: 1

## 2016-02-22 MED FILL — TRAZODONE HCL 50 MG PO TABS: 50 MG | ORAL | Qty: 1

## 2016-02-22 MED FILL — VITAMIN B-1 100 MG PO TABS: 100 MG | ORAL | Qty: 1

## 2016-02-22 MED FILL — GABAPENTIN 400 MG PO CAPS: 400 MG | ORAL | Qty: 2

## 2016-02-22 NOTE — Progress Notes (Signed)
Patient was visible on the floor. Patient reports alcohol cravings. Reports anxious and worried about her pet at  Home without food or water. Denies si , hi and AVH at this time. Cooperative and med compliant .

## 2016-02-22 NOTE — Plan of Care (Signed)
Problem: Altered Mood, Depressive Behavior  Goal: LTG-Able to verbalize acceptance of life and situations over which he or she has no control  Outcome: Ongoing    Goal: LTG-Able to verbalize and/or display a decrease in depressive symptoms  Outcome: Ongoing    Goal: STG-Able to verbalize suicidal ideations  Outcome: Ongoing    Goal: STG-Able to verbalize support system  Outcome: Ongoing    Goal: LTG-Absence of self-harm  Outcome: Met This Shift    Goal: STG-Knowledge of positive coping patterns  Outcome: Ongoing    Goal: Patient Specific Goal  Outcome: Ongoing    Goal: Participation in care planning  Outcome: Ongoing

## 2016-02-22 NOTE — Progress Notes (Signed)
Clinical Pharmacy Note    Warfarin consult follow-up    Recent Labs      02/22/16   1107   INR  1.3     No results for input(s): HGB, HCT, PLT in the last 72 hours.    Significant drug:drug interactions:  Trileptal, Lipitor, gabapentin, Trazodone     Notes:  Date INR Warfarin Dose   02/19/16 1.1 (from 02/18/16)  5 mg    02/20/16 1.1 7 mg    02/21/16 1.2 7 mg    02/22/16 1.3 7 mg                              INR subtherapeutic today (goal range 2.0-3.0). Will repeat dose 7 mg again tonight due to indication of PE. Will plan to decrease back to home dosing tomorrow. Will continue to monitor daily.  Daily PT/INR until stable within therapeutic range.    Thank you  Germaine PomfretKaitlyn M Arch Methot, PharmD  02/22/2016 3:29 PM

## 2016-02-22 NOTE — Plan of Care (Signed)
Problem: Altered Mood, Depressive Behavior  Goal: LTG-Able to verbalize acceptance of life and situations over which he or she has no control  Outcome: Ongoing    Goal: LTG-Able to verbalize and/or display a decrease in depressive symptoms  Outcome: Met This Shift    Goal: STG-Able to verbalize suicidal ideations  Outcome: Met This Shift  Pt denies SI  Goal: STG-Able to verbalize support system  Outcome: Met This Shift    Goal: LTG-Absence of self-harm  Outcome: Met This Shift    Goal: STG-Knowledge of positive coping patterns  Outcome: Ongoing    Goal: Patient Specific Goal  Outcome: Met This Shift    Goal: Participation in care planning  Outcome: Met This Shift

## 2016-02-22 NOTE — Progress Notes (Signed)
BEHAVIORAL HEALTH FOLLOW-UP NOTE     02/22/2016     Patient was seen and examined in person, Chart reviewed   Patient's case discussed with staff/team    Chief Complaint: depression    Patient says she slept OK. Her appetite is improving. She denied having any more suicidal or homicidal ideations, denied having any hallucinations, paranoia or delusions.     Appetite:   [x]  Normal/Unchanged  []  Increased  []  Decreased      Sleep:       []  Normal/Unchanged  [x]  Fair       []  Poor              Energy:    []  Normal/Unchanged  []  Increased  [x]  Decreased        SI []  Present  [x]  Absent    HI  [] Present  [x]  Absent     Aggression:  []  yes  [x]  no    Patient is [x]  able  []  unable to CONTRACT FOR SAFETY     PAST MEDICAL/PSYCHIATRIC HISTORY:   Past Medical History:   Diagnosis Date   ??? CAD (coronary artery disease)    ??? Cardiac angina (HCC)    ??? CHF (congestive heart failure) (HCC)    ??? COPD (chronic obstructive pulmonary disease) (HCC)    ??? 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 GERD (gastroesophageal reflux disease)    ??? History of blood transfusion    ??? Hyperlipidemia    ??? Hypertension    ??? MI (myocardial infarction)    ??? Neuromuscular disorder (HCC)    ??? Osteoarthritis    ??? Pneumonia    ??? Psychiatric problem    ??? Pulmonary embolism (HCC)    ??? Scoliosis        FAMILY/SOCIAL HISTORY:  Family History   Problem Relation Age of Onset   ??? Cirrhosis Mother    ??? Other Mother      Hepatitis B   ??? High Blood Pressure Father    ??? Cancer Maternal Grandmother    ??? Arthritis Maternal Grandmother    ??? Cancer Paternal Grandmother    ??? Scoliosis Paternal Grandmother    ??? High Blood Pressure Paternal Grandfather      Social History     Social History   ??? Marital status: Single     Spouse name: N/A   ??? Number of children: N/A   ??? Years of education: N/A     Occupational History   ??? Not on file.     Social History Main Topics   ??? Smoking status: Current Every Day Smoker     Packs/day: 0.50     Years: 38.00     Types: Cigarettes   ???  Smokeless tobacco: Not on file   ??? Alcohol use Yes      Comment: 3-4x week   ??? Drug use: Yes     Types: Cocaine      Comment: 3-4x/week   ??? Sexual activity: Not Currently     Other Topics Concern   ??? Not on file     Social History Narrative   ??? No narrative on file           ROS:  [x]  All negative/unchanged except if checked. Explain positive(checked items) below:  []  Constitutional  []  Eyes  []  Ear/Nose/Mouth/Throat  []  Respiratory  []  CV  []  GI  []  GU  []  Musculoskeletal  []  Skin/Breast  []  Neurological  []   Endocrine  []  Heme/Lymph  []  Allergic/Immunologic        MEDICATIONS:    Current Facility-Administered Medications:   ???  OXcarbazepine (TRILEPTAL) tablet 150 mg, 150 mg, Oral, BID, Gillermina Hu, MD, 150 mg at 02/22/16 1610  ???  nicotine polacrilex (NICORETTE) gum 4 mg, 4 mg, Oral, PRN, Gillermina Hu, MD, 4 mg at 02/21/16 0907  ???  albuterol sulfate HFA 108 (90 Base) MCG/ACT inhaler 2 puff, 2 puff, Inhalation, Q4H PRN, Desma Paganini, MD, 2 puff at 02/19/16 0818  ???  aspirin chewable tablet 81 mg, 81 mg, Oral, Daily, Desma Paganini, MD, 81 mg at 02/22/16 9604  ???  atorvastatin (LIPITOR) tablet 80 mg, 80 mg, Oral, Nightly, Desma Paganini, MD, 80 mg at 02/21/16 2108  ???  diphenhydrAMINE (BENADRYL) capsule 50 mg, 50 mg, Oral, Nightly, Desma Paganini, MD, 50 mg at 02/21/16 2108  ???  folic acid (FOLVITE) tablet 1 mg, 1 mg, Oral, Daily, Desma Paganini, MD, 1 mg at 02/22/16 5409  ???  gabapentin (NEURONTIN) capsule 800 mg, 800 mg, Oral, Nightly, Desma Paganini, MD, 800 mg at 02/21/16 2108  ???  ibuprofen (ADVIL;MOTRIN) tablet 800 mg, 800 mg, Oral, Q8H PRN, Desma Paganini, MD, 800 mg at 02/21/16 2110  ???  melatonin tablet 5 mg, 5 mg, Oral, Nightly, Desma Paganini, MD, 5 mg at 02/21/16 2110  ???  metoprolol tartrate (LOPRESSOR) tablet 25 mg, 25 mg, Oral, BID, Desma Paganini, MD, 25 mg at 02/22/16 0853  ???  OLANZapine (ZYPREXA) tablet 15 mg, 15 mg, Oral, Nightly, Desma Paganini, MD, 15 mg at 02/21/16 2109  ???  prazosin  (MINIPRESS) capsule 10 mg, 10 mg, Oral, Nightly, Desma Paganini, MD, 10 mg at 02/21/16 2108  ???  tiZANidine (ZANAFLEX) tablet 4 mg, 4 mg, Oral, TID, Desma Paganini, MD, 4 mg at 02/22/16 8119  ???  ondansetron (ZOFRAN) injection 4 mg, 4 mg, Intravenous, Q6H PRN, Desma Paganini, MD  ???  vitamin B-1 (THIAMINE) tablet 100 mg, 100 mg, Oral, Daily, Desma Paganini, MD, 100 mg at 02/22/16 1478  ???  multivitamin 1 tablet, 1 tablet, Oral, Daily, Desma Paganini, MD, 1 tablet at 02/22/16 [phone]  ???  hydrOXYzine (VISTARIL) injection 50 mg, 50 mg, Intramuscular, Q6H PRN **OR** hydrOXYzine (VISTARIL) capsule 50 mg, 50 mg, Oral, Q6H PRN, Desma Paganini, MD, 50 mg at 02/22/16 1001  ???  haloperidol (HALDOL) tablet 5 mg, 5 mg, Oral, Q6H PRN **OR** haloperidol lactate (HALDOL) injection 5 mg, 5 mg, Intramuscular, Q6H PRN, Desma Paganini, MD  ???  traZODone (DESYREL) tablet 50 mg, 50 mg, Oral, Nightly PRN, Desma Paganini, MD, 50 mg at 02/21/16 2109  ???  mometasone-formoterol (DULERA) 200-5 MCG/ACT inhaler 2 puff, 2 puff, Inhalation, BID, Desma Paganini, MD, 2 puff at 02/22/16 0813  ???  pantoprazole (PROTONIX) tablet 40 mg, 40 mg, Oral, QAM AC, Desma Paganini, MD, 40 mg at 02/22/16 2130  ???  warfarin (COUMADIN) daily dosing (placeholder), , Other, RX Placeholder, Alveta Heimlich, APRN  ???  nicotine polacrilex (NICORETTE) gum 2 mg, 2 mg, Oral, Once, Lilly Cove, PA-C      Examination:  BP 138/83    Pulse 81    Temp 98 ??F (36.7 ??C)    Resp 16    Ht 5\' 4"  (1.626 m)    Wt 150 lb (68 kg)    SpO2 98%    BMI 25.75 kg/m??   Gait - steady  Medication side effects(SE): no    Mental Status Examination:    Level of consciousness:  within normal limits   Appearance:  poor grooming and fair hygiene  Behavior/Motor:  psychomotor retardation  Attitude toward examiner:  cooperative  Speech:  slow   Mood: decreased range and depressed  Affect:  mood congruent  Thought processes:  linear and goal directed   Thought content:  Suicidal Ideation:  Denies  active  Delusions:  no evidence of delusions  Perceptual Disturbance:  denies any perceptual disturbance  Cognition:  oriented to person, place, and time   Concentration poor  Insight poor   Judgement poor     ASSESSMENT:   Patient symptoms are:  []  Well controlled  [x]  Improving  []  Worsening  []  No change      Diagnosis:   Principal Problem:    Bipolar depression (HCC)  Active Problems:    Cocaine use disorder, severe, dependence (HCC)      LABS:    No results for input(s): WBC, HGB, PLT in the last 72 hours.  No results for input(s): NA, K, CL, CO2, BUN, CREATININE, GLUCOSE in the last 72 hours.  No results for input(s): BILITOT, ALKPHOS, AST, ALT in the last 72 hours.  Lab Results   Component Value Date    LABAMPH Neg 02/18/2016    BARBSCNU Neg 02/18/2016    LABBENZ Neg 02/18/2016    LABBENZ NotDTCD 11/27/2010    OPIATESCREENURINE Neg 02/18/2016    PHENCYCLIDINESCREENURINE Neg 02/18/2016    ETOH 45 02/18/2016     Lab Results   Component Value Date    TSH 0.552 02/18/2016     Lab Results   Component Value Date    LITHIUM 0.2 (L) 08/04/2015     No results found for: VALPROATE, CBMZ      Treatment Plan:  Reviewed current Medications with the patient.   Trileptal - tolerating well. Adjust the dose of medication if needed  Risks, benefits, side effects, drug-to-drug interactions and alternatives to treatment were discussed.  Collateral information: pending from sister - [phone]  CD evaluation  Encourage patient to attend group and other milieu activities.  Discharge planning discussed with the patient and treatment team.    PSYCHOTHERAPY/COUNSELING:  [x]  Therapeutic interview  [x]  Supportive  []  CBT  []  Ongoing  []  Other       [x]  Patient continues to need, on a daily basis, active treatment furnished directly by or requiring the supervision of inpatient psychiatric personnel ??        ??  ??    Electronically signed by Desma Paganinianiel Apolo Cutshaw, MD on 02/22/2016 at 6:11 PM

## 2016-02-22 NOTE — Progress Notes (Signed)
Alert and oriented x3. Out of her room to get snacks and watch a movie but keeping to herself this afternoon. This morning pt was talkative, with increased energy. Denies HI, AVH. Reports a passive death wish and her mood as "so-so, depressed." Pt states she has called [employer] and left 2 messages.

## 2016-02-23 LAB — PROTIME-INR
INR: 1.5
Protime: 15.4 s — ABNORMAL HIGH (ref 8.1–13.7)

## 2016-02-23 MED ORDER — WARFARIN SODIUM 4 MG PO TABS
4 MG | Freq: Once | ORAL | Status: AC
Start: 2016-02-23 — End: 2016-02-23
  Administered 2016-02-24: 02:00:00 8 mg via ORAL

## 2016-02-23 MED FILL — NICORELIEF 4 MG MT GUM: 4 MG | OROMUCOSAL | Qty: 1

## 2016-02-23 MED FILL — GABAPENTIN 400 MG PO CAPS: 400 MG | ORAL | Qty: 2

## 2016-02-23 MED FILL — MELATONIN 1 MG PO TABS: 1 MG | ORAL | Qty: 2

## 2016-02-23 MED FILL — TIZANIDINE HCL 4 MG PO TABS: 4 MG | ORAL | Qty: 1

## 2016-02-23 MED FILL — TRAZODONE HCL 50 MG PO TABS: 50 MG | ORAL | Qty: 1

## 2016-02-23 MED FILL — VITAMIN B-1 100 MG PO TABS: 100 MG | ORAL | Qty: 1

## 2016-02-23 MED FILL — IBUPROFEN 800 MG PO TABS: 800 MG | ORAL | Qty: 1

## 2016-02-23 MED FILL — METOPROLOL TARTRATE 25 MG PO TABS: 25 MG | ORAL | Qty: 1

## 2016-02-23 MED FILL — OLANZAPINE 7.5 MG PO TABS: 7.5 MG | ORAL | Qty: 2

## 2016-02-23 MED FILL — OXCARBAZEPINE 150 MG PO TABS: 150 MG | ORAL | Qty: 1

## 2016-02-23 MED FILL — CHILDRENS ASPIRIN 81 MG PO CHEW: 81 MG | ORAL | Qty: 1

## 2016-02-23 MED FILL — DIPHENHYDRAMINE HCL 50 MG PO CAPS: 50 MG | ORAL | Qty: 1

## 2016-02-23 MED FILL — THEREMS PO TABS: ORAL | Qty: 1

## 2016-02-23 MED FILL — FOLIC ACID 1 MG PO TABS: 1 MG | ORAL | Qty: 1

## 2016-02-23 MED FILL — COUMADIN 4 MG PO TABS: 4 MG | ORAL | Qty: 2

## 2016-02-23 MED FILL — PRAZOSIN HCL 5 MG PO CAPS: 5 MG | ORAL | Qty: 2

## 2016-02-23 MED FILL — PANTOPRAZOLE SODIUM 40 MG PO TBEC: 40 MG | ORAL | Qty: 1

## 2016-02-23 MED FILL — HYDROXYZINE PAMOATE 50 MG PO CAPS: 50 MG | ORAL | Qty: 1

## 2016-02-23 MED FILL — ATORVASTATIN CALCIUM 80 MG PO TABS: 80 MG | ORAL | Qty: 1

## 2016-02-23 NOTE — Progress Notes (Signed)
Pt watching TV and visiting with peers.  Calm and cooperative.  Denies all and denies any needs at this time.

## 2016-02-23 NOTE — Progress Notes (Signed)
BEHAVIORAL HEALTH FOLLOW-UP NOTE     02/23/2016     Patient was seen and examined in person, Chart reviewed   Patient's case discussed with staff/team    Chief Complaint: depression    Patient says she is feeling "so-so". She said she was 'really antsy earlier'. She said she has been having racing thoughts; she has been thinking about her dog who is alone in the house, and her daughter and her sister have access to it, but have not been there to feed the dog. She said her sleep was "so-so"; she woke up in the middle of the night. She denied having suicidal ideations, but "does not want to wake up". She denied homicidal ideations.     Appetite:   [x]  Normal/Unchanged  []  Increased  []  Decreased      Sleep:       []  Normal/Unchanged  [x]  Fair       []  Poor              Energy:    []  Normal/Unchanged  []  Increased  [x]  Decreased        SI [x]  Present (passive) []  Absent    HI  [] Present  [x]  Absent     Aggression:  []  yes  [x]  no    Patient is []  able  [x]  unable to CONTRACT FOR SAFETY     PAST MEDICAL/PSYCHIATRIC HISTORY:   Past Medical History:   Diagnosis Date   ??? CAD (coronary artery disease)    ??? Cardiac angina (HCC)    ??? CHF (congestive heart failure) (HCC)    ??? COPD (chronic obstructive pulmonary disease) (HCC)    ??? 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 GERD (gastroesophageal reflux disease)    ??? History of blood transfusion    ??? Hyperlipidemia    ??? Hypertension    ??? MI (myocardial infarction)    ??? Neuromuscular disorder (HCC)    ??? Osteoarthritis    ??? Pneumonia    ??? Psychiatric problem    ??? Pulmonary embolism (HCC)    ??? Scoliosis        FAMILY/SOCIAL HISTORY:  Family History   Problem Relation Age of Onset   ??? Cirrhosis Mother    ??? Other Mother      Hepatitis B   ??? High Blood Pressure Father    ??? Cancer Maternal Grandmother    ??? Arthritis Maternal Grandmother    ??? Cancer Paternal Grandmother    ??? Scoliosis Paternal Grandmother    ??? High Blood Pressure Paternal Grandfather      Social History     Social  History   ??? Marital status: Single     Spouse name: N/A   ??? Number of children: N/A   ??? Years of education: N/A     Occupational History   ??? Not on file.     Social History Main Topics   ??? Smoking status: Current Every Day Smoker     Packs/day: 0.50     Years: 38.00     Types: Cigarettes   ??? Smokeless tobacco: Not on file   ??? Alcohol use Yes      Comment: 3-4x week   ??? Drug use: Yes     Types: Cocaine      Comment: 3-4x/week   ??? Sexual activity: Not Currently     Other Topics Concern   ??? Not on file     Social History Narrative   ???  No narrative on file           ROS:  [x]  All negative/unchanged except if checked. Explain positive(checked items) below:  []  Constitutional  []  Eyes  []  Ear/Nose/Mouth/Throat  []  Respiratory  []  CV  []  GI  []  GU  []  Musculoskeletal  []  Skin/Breast  []  Neurological  []  Endocrine  []  Heme/Lymph  []  Allergic/Immunologic        MEDICATIONS:    Current Facility-Administered Medications:   ???  warfarin (COUMADIN) tablet 8 mg, 8 mg, Oral, Once, Gillermina HuBalaji Saravanan, MD  ???  OXcarbazepine (TRILEPTAL) tablet 150 mg, 150 mg, Oral, BID, Gillermina HuBalaji Saravanan, MD, 150 mg at 02/23/16 0944  ???  nicotine polacrilex (NICORETTE) gum 4 mg, 4 mg, Oral, PRN, Gillermina HuBalaji Saravanan, MD, 4 mg at 02/23/16 1615  ???  albuterol sulfate HFA 108 (90 Base) MCG/ACT inhaler 2 puff, 2 puff, Inhalation, Q4H PRN, Desma Paganinianiel Jahnae Mcadoo, MD, 2 puff at 02/19/16 0818  ???  aspirin chewable tablet 81 mg, 81 mg, Oral, Daily, Desma Paganinianiel Yuna Pizzolato, MD, 81 mg at 02/23/16 [id number]  ???  atorvastatin (LIPITOR) tablet 80 mg, 80 mg, Oral, Nightly, Desma Paganinianiel Jerelene Salaam, MD, 80 mg at 02/22/16 2049  ???  diphenhydrAMINE (BENADRYL) capsule 50 mg, 50 mg, Oral, Nightly, Desma Paganinianiel Media Pizzini, MD, 50 mg at 02/22/16 2049  ???  folic acid (FOLVITE) tablet 1 mg, 1 mg, Oral, Daily, Desma Paganinianiel Kealani Leckey, MD, 1 mg at 02/23/16 [id number]  ???  gabapentin (NEURONTIN) capsule 800 mg, 800 mg, Oral, Nightly, Desma Paganinianiel Keyleen Cerrato, MD, 800 mg at 02/22/16 2048  ???  ibuprofen (ADVIL;MOTRIN) tablet 800 mg, 800 mg, Oral, Q8H PRN,  Desma Paganinianiel Janat Tabbert, MD, 800 mg at 02/23/16 1614  ???  melatonin tablet 5 mg, 5 mg, Oral, Nightly, Desma Paganinianiel Jose Corvin, MD, 5 mg at 02/22/16 2048  ???  metoprolol tartrate (LOPRESSOR) tablet 25 mg, 25 mg, Oral, BID, Desma Paganinianiel Jibril Mcminn, MD, 25 mg at 02/23/16 0944  ???  OLANZapine (ZYPREXA) tablet 15 mg, 15 mg, Oral, Nightly, Desma Paganinianiel Anissa Abbs, MD, 15 mg at 02/22/16 2048  ???  prazosin (MINIPRESS) capsule 10 mg, 10 mg, Oral, Nightly, Desma Paganinianiel Thai Hemrick, MD, 10 mg at 02/22/16 2049  ???  tiZANidine (ZANAFLEX) tablet 4 mg, 4 mg, Oral, TID, Desma Paganinianiel Ramy Greth, MD, 4 mg at 02/23/16 1427  ???  ondansetron (ZOFRAN) injection 4 mg, 4 mg, Intravenous, Q6H PRN, Desma Paganinianiel Tiona Ruane, MD  ???  vitamin B-1 (THIAMINE) tablet 100 mg, 100 mg, Oral, Daily, Desma Paganinianiel Arletta Lumadue, MD, 100 mg at 02/23/16 0943  ???  multivitamin 1 tablet, 1 tablet, Oral, Daily, Desma Paganinianiel Sharyn Brilliant, MD, 1 tablet at 02/23/16 0944  ???  hydrOXYzine (VISTARIL) injection 50 mg, 50 mg, Intramuscular, Q6H PRN **OR** hydrOXYzine (VISTARIL) capsule 50 mg, 50 mg, Oral, Q6H PRN, Desma Paganinianiel Sian Joles, MD, 50 mg at 02/23/16 1614  ???  haloperidol (HALDOL) tablet 5 mg, 5 mg, Oral, Q6H PRN **OR** haloperidol lactate (HALDOL) injection 5 mg, 5 mg, Intramuscular, Q6H PRN, Desma Paganinianiel Berlie Persky, MD  ???  traZODone (DESYREL) tablet 50 mg, 50 mg, Oral, Nightly PRN, Desma Paganinianiel Jacee Enerson, MD, 50 mg at 02/22/16 2103  ???  mometasone-formoterol (DULERA) 200-5 MCG/ACT inhaler 2 puff, 2 puff, Inhalation, BID, Desma Paganinianiel Hermelinda Diegel, MD, 2 puff at 02/23/16 [phone]  ???  pantoprazole (PROTONIX) tablet 40 mg, 40 mg, Oral, QAM AC, Desma Paganinianiel Eyob Godlewski, MD, 40 mg at 02/23/16 0708  ???  warfarin (COUMADIN) daily dosing (placeholder), , Other, RX Placeholder, Alveta HeimlichKelsey C Savage, APRN  ???  nicotine polacrilex (NICORETTE) gum 2 mg, 2 mg, Oral, Once, Lilly Coveichard J Krupar, PA-C  Examination:  BP (!) 129/94    Pulse 70    Temp 98.1 ??F (36.7 ??C) (Oral)    Resp 16    Ht 5\' 4"  (1.626 m)    Wt 150 lb (68 kg)    SpO2 98%    BMI 25.75 kg/m??   Gait - steady  Medication side effects(SE): no    Mental Status  Examination:    Level of consciousness:  within normal limits   Appearance:  fair grooming and fair hygiene  Behavior/Motor:  psychomotor retardation  Attitude toward examiner:  cooperative  Speech:  slow   Mood: decreased range and depressed  Affect:  mood congruent  Thought processes:  linear and goal directed   Thought content:  Suicidal Ideation:  Denies active, but has passive SI  Delusions:  no evidence of delusions  Perceptual Disturbance:  denies any perceptual disturbance  Cognition:  oriented to person, place, and time   Concentration poor  Insight poor   Judgement poor     ASSESSMENT:   Patient symptoms are:  []  Well controlled  [x]  Improving  []  Worsening  []  No change      Diagnosis:   Principal Problem:    Bipolar depression (HCC)  Active Problems:    Cocaine use disorder, severe, dependence (HCC)      LABS:    No results for input(s): WBC, HGB, PLT in the last 72 hours.  No results for input(s): NA, K, CL, CO2, BUN, CREATININE, GLUCOSE in the last 72 hours.  No results for input(s): BILITOT, ALKPHOS, AST, ALT in the last 72 hours.  Lab Results   Component Value Date    LABAMPH Neg 02/18/2016    BARBSCNU Neg 02/18/2016    LABBENZ Neg 02/18/2016    LABBENZ NotDTCD 11/27/2010    OPIATESCREENURINE Neg 02/18/2016    PHENCYCLIDINESCREENURINE Neg 02/18/2016    ETOH 45 02/18/2016     Lab Results   Component Value Date    TSH 0.552 02/18/2016     Lab Results   Component Value Date    LITHIUM 0.2 (L) 08/04/2015     No results found for: VALPROATE, CBMZ      Treatment Plan:  Reviewed current Medications with the patient.   Trileptal - tolerating well. Adjust the dose of medication if needed  Risks, benefits, side effects, drug-to-drug interactions and alternatives to treatment were discussed.  Collateral information: pending from sister - [phone]  CD evaluation  Encourage patient to attend group and other milieu activities.  Discharge planning discussed with the patient and treatment  team.    PSYCHOTHERAPY/COUNSELING:  [x]  Therapeutic interview  [x]  Supportive  []  CBT  []  Ongoing  []  Other       [x]  Patient continues to need, on a daily basis, active treatment furnished directly by or requiring the supervision of inpatient psychiatric personnel ??        ??  ??    Electronically signed by Desma Paganini, MD on 02/23/2016 at 5:48 PM

## 2016-02-23 NOTE — Care Coordination-Inpatient (Signed)
[facility] Biopsychosocial Assessment    Current Level of Psychosocial Functioning     Independent   Dependent  x  Minimal Assist     Comments:  Patient receives most government benefits and has for the past several years.    Psychosocial High Risk Factors (check all that apply)    Unable to obtain meds   Chronic illness/pain    Substance abuse x  Lack of Family Support   Financial stress   Isolation   Inadequate Community Resources  Suicide attempt(s)  Not taking medications   Victim of crime   Developmental Delay  Unable to manage personal needs    Age 56 or older   Homeless  No transportation   Readmission within 30 days  Unemployment  Traumatic Event    Comments: Patient's primary risk factor is substance abuse.    Psychiatric Advanced Directives: None Reported.    Family to Involve in Treatment: Patient provided her sister's contact information to complete collateral.    Sexual Orientation:  Patient is in neither a hetero or homosexual relationship.    Patient Strengths: Patient was forthcoming about needing drug treatment.     Patient Barriers: None Identified.    Opiate Education Provided:  Patient was provided brief education as indicated by her SBIRT score.    CMHC/mental health history: Patient has been on the [facility] on several occasions over the years.    Plan of Care   medication management, group/individual therapies, family meetings, psycho -education, treatment team meetings to assist with stabilization    Initial Discharge Plan:  Patient will return home and continue services at the [facility].      Clinical Summary:    Patient is a 51 year old female who was admitted to the [facility] due to depression and suicidal ideation. Patient has been on the unit in 2017. She is a client at the [facility] and has been for several years. Patient was forthcoming about self-medicating. She believes none of her medication is working so she resorts to drugs/alcohol. Patient wants to gets helps for her addiction. She was in drug  treatment about 20 years ago and feels it will be helpful with respect to her present condition.    Lucretia Field LISW-S

## 2016-02-23 NOTE — Plan of Care (Signed)
Problem: Altered Mood, Depressive Behavior  Goal: LTG-Able to verbalize acceptance of life and situations over which he or she has no control  Outcome: Ongoing    Goal: LTG-Able to verbalize and/or display a decrease in depressive symptoms  Outcome: Ongoing    Goal: STG-Able to verbalize suicidal ideations  Outcome: Ongoing    Goal: STG-Able to verbalize support system  Outcome: Ongoing    Goal: LTG-Absence of self-harm  Outcome: Ongoing    Goal: STG-Knowledge of positive coping patterns  Outcome: Ongoing    Goal: Patient Specific Goal  Outcome: Ongoing    Goal: Participation in care planning  Outcome: Ongoing

## 2016-02-23 NOTE — Progress Notes (Signed)
Clinical Pharmacy Note    Warfarin consult follow-up    Recent Labs      02/23/16   0747   INR  1.5     No results for input(s): HGB, HCT, PLT in the last 72 hours.    Significant drug:drug interactions:  oxcarbazepine    Notes:  Trileptal, Lipitor, gabapentin, Trazodone      Notes:  Date INR Warfarin Dose   02/19/16 1.1 (from 02/18/16)  5 mg    02/20/16 1.1 7 mg    02/21/16 1.2 7 mg    02/22/16 1.3 7 mg   02/23/16 1.5 8 mg                      INR still subtherapeutic at 1.5. Patient may need increased doses while on oxcarbazepine which may be inducing metabolism of warfarin. Will give 8 mg tonight. Reassess dosing tomorrow  Daily PT/INR until stable within therapeutic range.    Lawanna KobusJoshua Christophr Calix, PharmD  Staff Pharmacist  02/23/2016 3:17 PM

## 2016-02-23 NOTE — Progress Notes (Signed)
Assessment complete.  Pt denies all and is calm and cooperative.  C/o anxiety of 7 and requesting Vistaril.  Watching TV with peers and interacting appropriately.  Denies further needs at this time.

## 2016-02-23 NOTE — Care Coordination-Inpatient (Signed)
Brief Intervention and Referral to Treatment Summary    Patient was provided PHQ-9, AUDIT and DAST Screening:      PHQ-9 Score:  19  AUDIT Score: 29  DAST Score:  5    Patient???s substance use is considered    Low Risk/Healthy  Risk  Harmful  Dependent x    Patient???s depression is considered:    Minimal  Mild   Moderate  Moderately Severe x  Severe    Brief Education was provided:    Patient was receptive x  Patient was not receptive      Brief Intervention Is Provided (Only for AUDIT or DAST, there is no brief intervention for Depression)    Patient reports readiness to decrease and/or stop use and a plan was discussed x  Patient denies readiness to decrease and/or stop use and a plan was not discussed      Recommendations/Referrals for Brief and/or Specialized Treatment Provided to Patient  Patient was forthcoming about her drug and alcohol difficulties. Brief education/intervention was completed. Patient was provided information on drug treatment programs. Patient is willing to do inpatient or outpatient.    Lucretia Field LISW-S

## 2016-02-23 NOTE — Care Coordination-Inpatient (Signed)
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 Patient presented with depression, panic, and suicidal ideation.    Top 3 Life Stressors:   Patient's adult son  Life in general  Many medical conditions    Background History Relevant to Current Hospitalization:  Patient drinks and drugs to self-medicate. This has been going on for about 12 months.    Family Mental Health/Substance Use History:   Unknown.    Support Network's Goal for Hospitalization:   Patient medications need regulated. She is on too much medication.    Discharge Plan:   Patient will return home, continue with [facility], and seek inpatient drug treatment.    Support Network Supportive of Discharge Plan: Yes    Support can confirm Runner, broadcasting/film/video and Security of Weapons:   Informant states there are no weapons in the home.    Support agreeable to Safeguard and Monitor Medications (including Prescription and OTC): Yes    Identified Barriers to Compliance with Discharge Plan:   None Identified.    Recommendations for Support Network:   Pleas call with questions.      Lucretia Field, LISW

## 2016-02-23 NOTE — Progress Notes (Signed)
Visible on unit throughout evening, social with select peers. Pt voices concern that her dog is alone in the basement at home and pt is worried. Pt denies any disturbances in mood this evening.

## 2016-02-24 LAB — PROTIME-INR
INR: 1.6
Protime: 16.6 s — ABNORMAL HIGH (ref 8.1–13.7)

## 2016-02-24 MED ORDER — WARFARIN SODIUM 4 MG PO TABS
4 MG | Freq: Once | ORAL | Status: AC
Start: 2016-02-24 — End: 2016-02-24
  Administered 2016-02-24: 23:00:00 8 mg via ORAL

## 2016-02-24 MED FILL — CHILDRENS ASPIRIN 81 MG PO CHEW: 81 MG | ORAL | Qty: 1

## 2016-02-24 MED FILL — FOLIC ACID 1 MG PO TABS: 1 MG | ORAL | Qty: 1

## 2016-02-24 MED FILL — COUMADIN 4 MG PO TABS: 4 MG | ORAL | Qty: 2

## 2016-02-24 MED FILL — PANTOPRAZOLE SODIUM 40 MG PO TBEC: 40 MG | ORAL | Qty: 1

## 2016-02-24 MED FILL — THEREMS PO TABS: ORAL | Qty: 1

## 2016-02-24 MED FILL — NICORELIEF 4 MG MT GUM: 4 MG | OROMUCOSAL | Qty: 1

## 2016-02-24 MED FILL — METOPROLOL TARTRATE 25 MG PO TABS: 25 MG | ORAL | Qty: 1

## 2016-02-24 MED FILL — TIZANIDINE HCL 4 MG PO TABS: 4 MG | ORAL | Qty: 1

## 2016-02-24 MED FILL — TRAZODONE HCL 50 MG PO TABS: 50 MG | ORAL | Qty: 1

## 2016-02-24 MED FILL — OLANZAPINE 7.5 MG PO TABS: 7.5 MG | ORAL | Qty: 2

## 2016-02-24 MED FILL — OXCARBAZEPINE 150 MG PO TABS: 150 MG | ORAL | Qty: 1

## 2016-02-24 MED FILL — ATORVASTATIN CALCIUM 80 MG PO TABS: 80 MG | ORAL | Qty: 1

## 2016-02-24 MED FILL — MELATONIN 1 MG PO TABS: 1 MG | ORAL | Qty: 2

## 2016-02-24 MED FILL — IBUPROFEN 800 MG PO TABS: 800 MG | ORAL | Qty: 1

## 2016-02-24 MED FILL — GABAPENTIN 400 MG PO CAPS: 400 MG | ORAL | Qty: 2

## 2016-02-24 MED FILL — DIPHENHYDRAMINE HCL 50 MG PO CAPS: 50 MG | ORAL | Qty: 1

## 2016-02-24 MED FILL — PRAZOSIN HCL 5 MG PO CAPS: 5 MG | ORAL | Qty: 2

## 2016-02-24 MED FILL — VITAMIN B-1 100 MG PO TABS: 100 MG | ORAL | Qty: 1

## 2016-02-24 MED FILL — HYDROXYZINE PAMOATE 50 MG PO CAPS: 50 MG | ORAL | Qty: 1

## 2016-02-24 NOTE — Progress Notes (Signed)
Clinical Pharmacy Note    Warfarin consult follow-up    Recent Labs      02/24/16   0811   INR  1.6     No results for input(s): HGB, HCT, PLT in the last 72 hours.    Significant drug:drug interactions:  oxcarbazepine    Notes:  Date INR Warfarin Dose   02/19/16 1.1 (from 02/18/16)  5 mg    02/20/16 1.1 7 mg    02/21/16 1.2 7 mg    02/22/16 1.3 7 mg   02/23/16 1.5 8 mg    02/24/16 1.6  8 mg             Patient INR still subtherapeutic at 1.6, will give 8 mg again tonight. Patient may be requiring increased doses due to oxcarbazepine  Daily PT/INR until stable within therapeutic range.    Lawanna KobusJoshua Josuel Koeppen, PharmD  Staff Pharmacist  02/24/2016 3:19 PM

## 2016-02-24 NOTE — Plan of Care (Signed)
Problem: Altered Mood, Depressive Behavior  Goal: LTG-Able to verbalize acceptance of life and situations over which he or she has no control  Outcome: Ongoing    Goal: LTG-Able to verbalize and/or display a decrease in depressive symptoms  Outcome: Ongoing    Goal: STG-Able to verbalize suicidal ideations  Outcome: Ongoing    Goal: STG-Able to verbalize support system  Outcome: Ongoing    Goal: LTG-Absence of self-harm  Outcome: Ongoing    Goal: STG-Knowledge of positive coping patterns  Outcome: Ongoing    Goal: Patient Specific Goal  Outcome: Ongoing    Goal: Participation in care planning  Outcome: Ongoing

## 2016-02-24 NOTE — Progress Notes (Signed)
Pt denies any current suicidal ideation, homicidal ideation or hallucinations.     Pt brightened and smiling when approached. Pt states she is happy to be DC tomorrow. Pt is hopeful of participating with and getting help from [facility]" who will support her in her attempts at sobriety.    Pt voices no complaints.

## 2016-02-24 NOTE — Discharge Instructions (Signed)
.   Good nutrition is important when healing from an illness, injury, or surgery.  Follow any nutrition recommendations given to you during your hospital stay.   . If you were given an oral nutrition supplement while in the hospital, continue to take this supplement at home.  You can take it with meals, in-between meals, and/or before bedtime. These supplements can be purchased at most local grocery stores, pharmacies, and chain super-stores.   . If you have any questions about your diet or nutrition, call the hospital and ask for the dietitian.   as at home

## 2016-02-24 NOTE — Progress Notes (Signed)
Pt. attended the 0900 community meeting.Electronically signed by Mliss Sax, CTRS on 02/24/2016 at 10:01 AM

## 2016-02-24 NOTE — Behavioral Health Treatment Team (Signed)
Patient did not attend group therapy from 2:30-3:15, despite encouragement.

## 2016-02-24 NOTE — Behavioral Health Treatment Team (Signed)
Pt attended Wrap Up group at 2000. Pt reports wanting to go to a treatment facility on discharge, but since that isn't happening, she will be discharged home tomorrow and will meet with a representative from [facility].     Electronically signed by Emi HolesSarah  Joshua Soulier on 02/24/2016 at 10:53 PM

## 2016-02-24 NOTE — Progress Notes (Signed)
Visible/ social with peers. Irritable d/t another peer countinuously entering into room, otherwise mood stable, brightened affect. Denies SI, HI, or AV hallucinations.

## 2016-02-24 NOTE — Progress Notes (Signed)
BEHAVIORAL HEALTH FOLLOW-UP NOTE     02/24/2016     Patient was seen and examined in person, Chart reviewed   Patient's case discussed with staff/team    Chief Complaint: depression, addiction    Interim History:     Pt is motivated to seek treatment  Pt is worried about just going home and relpasing  Pt is making phone calls to key and other places  Pt is less depressed  Denies SI/HI  Less hopeless and worthless feeling  No agitation    Appetite:   [x]  Normal/Unchanged  []  Increased  []  Decreased      Sleep:       []  Normal/Unchanged  [x]  Fair       []  Poor              Energy:    []  Normal/Unchanged  []  Increased  [x]  Decreased        SI []  Present  [x]  Absent    HI  [] Present  [x]  Absent     Aggression:  []  yes  [x]  no    Patient is []  able  [x]  unable to CONTRACT FOR SAFETY     PAST MEDICAL/PSYCHIATRIC HISTORY:   Past Medical History:   Diagnosis Date   ??? CAD (coronary artery disease)    ??? Cardiac angina (HCC)    ??? CHF (congestive heart failure) (HCC)    ??? COPD (chronic obstructive pulmonary disease) (HCC)    ??? 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 GERD (gastroesophageal reflux disease)    ??? History of blood transfusion    ??? Hyperlipidemia    ??? Hypertension    ??? MI (myocardial infarction)    ??? Neuromuscular disorder (HCC)    ??? Osteoarthritis    ??? Pneumonia    ??? Psychiatric problem    ??? Pulmonary embolism (HCC)    ??? Scoliosis        FAMILY/SOCIAL HISTORY:  Family History   Problem Relation Age of Onset   ??? Cirrhosis Mother    ??? Other Mother      Hepatitis B   ??? High Blood Pressure Father    ??? Cancer Maternal Grandmother    ??? Arthritis Maternal Grandmother    ??? Cancer Paternal Grandmother    ??? Scoliosis Paternal Grandmother    ??? High Blood Pressure Paternal Grandfather      Social History     Social History   ??? Marital status: Single     Spouse name: N/A   ??? Number of children: N/A   ??? Years of education: N/A     Occupational History   ??? Not on file.     Social History Main Topics   ??? Smoking status: Current  Every Day Smoker     Packs/day: 0.50     Years: 38.00     Types: Cigarettes   ??? Smokeless tobacco: Not on file   ??? Alcohol use Yes      Comment: 3-4x week   ??? Drug use: Yes     Types: Cocaine      Comment: 3-4x/week   ??? Sexual activity: Not Currently     Other Topics Concern   ??? Not on file     Social History Narrative   ??? No narrative on file           ROS:  [x]  All negative/unchanged except if checked. Explain positive(checked items) below:  []  Constitutional  []  Eyes  []   Ear/Nose/Mouth/Throat  []  Respiratory  []  CV  []  GI  []  GU  []  Musculoskeletal  []  Skin/Breast  []  Neurological  []  Endocrine  []  Heme/Lymph  []  Allergic/Immunologic    Explanation:     MEDICATIONS:    Current Facility-Administered Medications:   ???  OXcarbazepine (TRILEPTAL) tablet 150 mg, 150 mg, Oral, BID, Gillermina Hu, MD, 150 mg at 02/24/16 0844  ???  nicotine polacrilex (NICORETTE) gum 4 mg, 4 mg, Oral, PRN, Gillermina Hu, MD, 4 mg at 02/24/16 0844  ???  albuterol sulfate HFA 108 (90 Base) MCG/ACT inhaler 2 puff, 2 puff, Inhalation, Q4H PRN, Desma Paganini, MD, 2 puff at 02/23/16 2108  ???  aspirin chewable tablet 81 mg, 81 mg, Oral, Daily, Desma Paganini, MD, 81 mg at 02/24/16 0844  ???  atorvastatin (LIPITOR) tablet 80 mg, 80 mg, Oral, Nightly, Desma Paganini, MD, 80 mg at 02/23/16 2058  ???  diphenhydrAMINE (BENADRYL) capsule 50 mg, 50 mg, Oral, Nightly, Desma Paganini, MD, 50 mg at 02/23/16 2058  ???  folic acid (FOLVITE) tablet 1 mg, 1 mg, Oral, Daily, Desma Paganini, MD, 1 mg at 02/24/16 0844  ???  gabapentin (NEURONTIN) capsule 800 mg, 800 mg, Oral, Nightly, Desma Paganini, MD, 800 mg at 02/23/16 2058  ???  ibuprofen (ADVIL;MOTRIN) tablet 800 mg, 800 mg, Oral, Q8H PRN, Desma Paganini, MD, 800 mg at 02/23/16 1614  ???  melatonin tablet 5 mg, 5 mg, Oral, Nightly, Desma Paganini, MD, 5 mg at 02/23/16 2058  ???  metoprolol tartrate (LOPRESSOR) tablet 25 mg, 25 mg, Oral, BID, Desma Paganini, MD, 25 mg at 02/24/16 0844  ???  OLANZapine (ZYPREXA) tablet 15  mg, 15 mg, Oral, Nightly, Desma Paganini, MD, 15 mg at 02/23/16 2058  ???  prazosin (MINIPRESS) capsule 10 mg, 10 mg, Oral, Nightly, Desma Paganini, MD, 10 mg at 02/23/16 2057  ???  tiZANidine (ZANAFLEX) tablet 4 mg, 4 mg, Oral, TID, Desma Paganini, MD, 4 mg at 02/24/16 0844  ???  ondansetron (ZOFRAN) injection 4 mg, 4 mg, Intravenous, Q6H PRN, Desma Paganini, MD  ???  vitamin B-1 (THIAMINE) tablet 100 mg, 100 mg, Oral, Daily, Desma Paganini, MD, 100 mg at 02/24/16 0844  ???  multivitamin 1 tablet, 1 tablet, Oral, Daily, Desma Paganini, MD, 1 tablet at 02/24/16 [phone]  ???  hydrOXYzine (VISTARIL) injection 50 mg, 50 mg, Intramuscular, Q6H PRN **OR** hydrOXYzine (VISTARIL) capsule 50 mg, 50 mg, Oral, Q6H PRN, Desma Paganini, MD, 50 mg at 02/23/16 1614  ???  haloperidol (HALDOL) tablet 5 mg, 5 mg, Oral, Q6H PRN **OR** haloperidol lactate (HALDOL) injection 5 mg, 5 mg, Intramuscular, Q6H PRN, Desma Paganini, MD  ???  traZODone (DESYREL) tablet 50 mg, 50 mg, Oral, Nightly PRN, Desma Paganini, MD, 50 mg at 02/23/16 2058  ???  mometasone-formoterol (DULERA) 200-5 MCG/ACT inhaler 2 puff, 2 puff, Inhalation, BID, Desma Paganini, MD, 2 puff at 02/24/16 [phone]  ???  pantoprazole (PROTONIX) tablet 40 mg, 40 mg, Oral, QAM AC, Desma Paganini, MD, 40 mg at 02/24/16 0620  ???  warfarin (COUMADIN) daily dosing (placeholder), , Other, RX Placeholder, Alveta Heimlich, APRN  ???  nicotine polacrilex (NICORETTE) gum 2 mg, 2 mg, Oral, Once, Lilly Cove, PA-C      Examination:  BP (!) 169/109    Pulse 75    Temp 98.1 ??F (36.7 ??C) (Oral)    Resp 18    Ht 5\' 4"  (1.626 m)    Wt 150 lb (68 kg)    SpO2 99%  BMI 25.75 kg/m??   Gait - steady  Medication side effects(SE): no    Mental Status Examination:    Level of consciousness:  within normal limits   Appearance:  fair grooming and fair hygiene  Behavior/Motor:  Less psychomotor retardation  Attitude toward examiner:  cooperative  Speech:  slow   Mood:less depressed  Affect:  mood congruent  Thought processes:   linear and goal directed   Thought content:  Suicidal Ideation:  denies suicidal ideation  Delusions:  no evidence of delusions  Perceptual Disturbance:  denies any perceptual disturbance  Cognition:  oriented to person, place, and time   Concentration intact  Insight fair   Judgement fair     ASSESSMENT:   Patient symptoms are:  []  Well controlled  [x]  Improving  []  Worsening  []  No change      Diagnosis:   Principal Problem:    Bipolar depression (HCC)  Active Problems:    Cocaine use disorder, severe, dependence (HCC)      LABS:    No results for input(s): WBC, HGB, PLT in the last 72 hours.  No results for input(s): NA, K, CL, CO2, BUN, CREATININE, GLUCOSE in the last 72 hours.  No results for input(s): BILITOT, ALKPHOS, AST, ALT in the last 72 hours.  Lab Results   Component Value Date    LABAMPH Neg 02/18/2016    BARBSCNU Neg 02/18/2016    LABBENZ Neg 02/18/2016    LABBENZ NotDTCD 11/27/2010    OPIATESCREENURINE Neg 02/18/2016    PHENCYCLIDINESCREENURINE Neg 02/18/2016    ETOH 45 02/18/2016     Lab Results   Component Value Date    TSH 0.552 02/18/2016     Lab Results   Component Value Date    LITHIUM 0.2 (L) 08/04/2015     No results found for: VALPROATE, CBMZ      Treatment Plan:  Reviewed current Medications with the patient.   Zyprexa 15 mg po qhs   Risks, benefits, side effects, drug-to-drug interactions and alternatives to treatment were discussed.  Collateral information: pending  CD evaluation - pending rehab  Encourage patient to attend group and other milieu activities.  Discharge planning discussed with the patient and treatment team.    PSYCHOTHERAPY/COUNSELING:  [x]  Therapeutic interview  [x]  Supportive  []  CBT  []  Ongoing  []  Other    [x]  Patient continues to need, on a daily basis, active treatment furnished directly by or requiring the supervision of inpatient psychiatric personnel ??    Anticipated Length of stay:    ??  ??    Electronically signed by Gillermina HuBALAJI Dolton Shaker, MD on 02/24/2016 at 10:28  AM

## 2016-02-24 NOTE — Discharge Instructions (Signed)
As tolerated

## 2016-02-24 NOTE — Care Coordination-Inpatient (Signed)
Pt approached team station asking for a NITRO tablet, states she is having chest pain.     EKG ordered, nitro x 1 ordered, troponin 1x stat with 1x am ordered by Dr. Marliss CootsNkadi.

## 2016-02-24 NOTE — Progress Notes (Addendum)
At 2344, Nitrostat SL tab given for chest pain. Will continue to monitor patient. Electronically signed by Jessie Foot, LPN on 02/21/1094 at 11:48 PM

## 2016-02-24 NOTE — Progress Notes (Signed)
Out on unit, social. Denies SI or hallucinations. States feeling better. Cooperative, eating and sleeping.

## 2016-02-24 NOTE — Progress Notes (Signed)
Group Therapy Note    Date: 02/24/2016  Start Time: 1100  End Time:  1145  Number of Participants: 5    Type of Group: Psychoeducation    Wellness Binder Information  Module Name:    Session Number:      Patient's Goal:  "to go directly to a treatment center"    Notes:  Pt. attended the skill group. Pt. worked on project with interest. Wants to stay clean and get back to sober living. Preparing herself for the temptation she may encounter. Wants to go directly to treatment center before going home.    Status After Intervention:  Improved    Participation Level: Active Listener and Interactive    Participation Quality: Appropriate, Attentive and Sharing      Speech:  normal      Thought Process/Content: Logical      Affective Functioning: Congruent      Mood: calm      Level of consciousness:  Alert, Oriented x4 and Attentive      Response to Learning: Progressing to goal      Endings: None Reported    Modes of Intervention: Education, Support, Socialization and Activity      Discipline Responsible: Psychoeducational Specialist      Signature:  Mliss Sax, [employer]

## 2016-02-25 LAB — EKG 12-LEAD
Atrial Rate: 69 {beats}/min
Atrial Rate: 77 {beats}/min
P Axis: 70 degrees
P Axis: 75 degrees
P-R Interval: 164 ms
P-R Interval: 172 ms
Q-T Interval: 372 ms
Q-T Interval: 388 ms
QRS Duration: 74 ms
QRS Duration: 76 ms
QTc Calculation (Bazett): 415 ms
QTc Calculation (Bazett): 420 ms
R Axis: 12 degrees
R Axis: 9 degrees
T Axis: 42 degrees
T Axis: 52 degrees
Ventricular Rate: 69 {beats}/min
Ventricular Rate: 77 {beats}/min

## 2016-02-25 LAB — PROTIME-INR
INR: 1.8
Protime: 19.3 s — ABNORMAL HIGH (ref 8.1–13.7)

## 2016-02-25 LAB — TROPONIN
Troponin: <0.01 ng/mL (ref 0.000–0.010)
Troponin: <0.01 ng/mL (ref 0.000–0.010)

## 2016-02-25 MED ORDER — HYDROXYZINE PAMOATE 50 MG PO CAPS
50 MG | ORAL_CAPSULE | Freq: Four times a day (QID) | ORAL | 1 refills | Status: AC | PRN
Start: 2016-02-25 — End: 2016-03-10

## 2016-02-25 MED ORDER — NITROGLYCERIN 0.4 MG SL SUBL
0.4 MG | SUBLINGUAL | Status: AC | PRN
Start: 2016-02-25 — End: 2016-02-24
  Administered 2016-02-25: 05:00:00 0.4 mg via SUBLINGUAL

## 2016-02-25 MED ORDER — OXCARBAZEPINE 150 MG PO TABS
150 MG | ORAL_TABLET | Freq: Two times a day (BID) | ORAL | 1 refills | Status: DC
Start: 2016-02-25 — End: 2016-05-20

## 2016-02-25 MED ORDER — OLANZAPINE 15 MG PO TABS
15 MG | ORAL_TABLET | Freq: Every evening | ORAL | 2 refills | Status: DC
Start: 2016-02-25 — End: 2016-05-20

## 2016-02-25 MED ORDER — NITROGLYCERIN 0.4 MG SL SUBL
0.4 MG | SUBLINGUAL | Status: DC
Start: 2016-02-25 — End: 2016-02-25

## 2016-02-25 MED ORDER — WARFARIN SODIUM 4 MG PO TABS
4 MG | Freq: Once | ORAL | Status: DC
Start: 2016-02-25 — End: 2016-02-25

## 2016-02-25 MED ORDER — PRAZOSIN HCL 5 MG PO CAPS
5 MG | ORAL_CAPSULE | Freq: Every evening | ORAL | 1 refills | Status: DC
Start: 2016-02-25 — End: 2016-05-20

## 2016-02-25 MED ORDER — MELATONIN 1 MG PO TABS
1 MG | ORAL_TABLET | Freq: Every evening | ORAL | 1 refills | Status: AC
Start: 2016-02-25 — End: ?

## 2016-02-25 MED FILL — NICORELIEF 4 MG MT GUM: 4 MG | OROMUCOSAL | Qty: 1

## 2016-02-25 MED FILL — OXCARBAZEPINE 150 MG PO TABS: 150 MG | ORAL | Qty: 1

## 2016-02-25 MED FILL — TIZANIDINE HCL 4 MG PO TABS: 4 MG | ORAL | Qty: 1

## 2016-02-25 MED FILL — PRAZOSIN HCL 5 MG PO CAPS: 5 MG | ORAL | Qty: 2

## 2016-02-25 MED FILL — THEREMS PO TABS: ORAL | Qty: 1

## 2016-02-25 MED FILL — OLANZAPINE 7.5 MG PO TABS: 7.5 MG | ORAL | Qty: 2

## 2016-02-25 MED FILL — MELATONIN 1 MG PO TABS: 1 MG | ORAL | Qty: 2

## 2016-02-25 MED FILL — METOPROLOL TARTRATE 25 MG PO TABS: 25 MG | ORAL | Qty: 1

## 2016-02-25 MED FILL — TRAZODONE HCL 50 MG PO TABS: 50 MG | ORAL | Qty: 1

## 2016-02-25 MED FILL — PANTOPRAZOLE SODIUM 40 MG PO TBEC: 40 MG | ORAL | Qty: 1

## 2016-02-25 MED FILL — IBUPROFEN 800 MG PO TABS: 800 MG | ORAL | Qty: 1

## 2016-02-25 MED FILL — HYDROXYZINE PAMOATE 50 MG PO CAPS: 50 MG | ORAL | Qty: 1

## 2016-02-25 MED FILL — GABAPENTIN 400 MG PO CAPS: 400 MG | ORAL | Qty: 2

## 2016-02-25 MED FILL — CHILDRENS ASPIRIN 81 MG PO CHEW: 81 MG | ORAL | Qty: 1

## 2016-02-25 MED FILL — NITROSTAT 0.4 MG SL SUBL: 0.4 MG | SUBLINGUAL | Qty: 25

## 2016-02-25 MED FILL — DIPHENHYDRAMINE HCL 50 MG PO CAPS: 50 MG | ORAL | Qty: 1

## 2016-02-25 MED FILL — ATORVASTATIN CALCIUM 80 MG PO TABS: 80 MG | ORAL | Qty: 1

## 2016-02-25 MED FILL — FOLIC ACID 1 MG PO TABS: 1 MG | ORAL | Qty: 1

## 2016-02-25 MED FILL — VITAMIN B-1 100 MG PO TABS: 100 MG | ORAL | Qty: 1

## 2016-02-25 NOTE — Progress Notes (Signed)
Clinical Pharmacy Note    Warfarin consult follow-up    Recent Labs      02/25/16   0646   INR  1.8     No results for input(s): HGB, HCT, PLT in the last 72 hours.      Notes:  Date INR Warfarin Dose   02/19/16 1.1 (from 02/18/16)  5 mg    02/20/16 1.1 7 mg    02/21/16 1.2 7 mg    02/22/16 1.3 7 mg   02/23/16 1.5 8 mg    02/24/16 1.6  8 mg   02/25/16 1.8 8 mg                            Patient INR of 1.8 is slightly sub-therapeutic. Patient INR did rise 0.2 with current dose. Will dose with 8 mg warfarin today.  Daily PT/INR until stable within therapeutic range.     Shon BatonNeil E Miu Chiong, R.Ph.  02/25/2016  10:19 AM

## 2016-02-25 NOTE — Discharge Instructions (Signed)
Keep all follow up appointments, take medications as ordered, utilize positive supports, abstain from use of alcohol and drugs. If symptoms return or you feel at risk to yourself or others, please call 911, return the nearest emergency room, or call your local crisis hotline:  [facility]: [phone]  [facility]: [phone]  [facility]: [phone]      You have arranged to see "[facility]'s get [facility]" staff " today to discuss chemical dependence treatment

## 2016-02-25 NOTE — Progress Notes (Signed)
Out on unit, social. Denies SI or hallucinations. States she is having some anxiety about DC today. Spent a lot of the morning on the phone looking to get into rehab. Cooperative, eating and sleeping.

## 2016-02-25 NOTE — Progress Notes (Signed)
Group Therapy Note    Date: 02/25/2016  Start Time: 1100  End Time:  1145  Number of Participants: 5    Type of Group: Psychoeducation    Wellness Binder Information  Module Name:    Session Number:      Patient's Goal:  "to go home"    Notes:  Pt. attended the skill group. Worked on project with pride. Confident  that she will be able  to avoid using drugs once dc. Feels better.    Status After Intervention:  Improved    Participation Level: Active Listener and Interactive    Participation Quality: Appropriate, Attentive and Sharing      Speech:  normal      Thought Process/Content: Logical      Affective Functioning: Congruent      Mood: calm, happy      Level of consciousness:  Alert, Oriented x4 and Attentive      Response to Learning: Progressing to goal      Endings: None Reported    Modes of Intervention: Education, Support, Socialization and Activity      Discipline Responsible: Psychoeducational Specialist      Signature:  Mliss Sax, [employer]

## 2016-02-25 NOTE — Discharge Summary (Signed)
DISCHARGE SUMMARY      Patient ID:  Alisha Mccall  [id number]  51 y.o.  06/13/1965    Admit date: 02/18/2016    Discharge date and time: 02/25/16    Admitting Physician: Desma Paganini, MD     Discharge Physician: Dr Wynelle Bourgeois MD    Admission Diagnoses: Bipolar 1 disorder [facility]) [F31.9]    Admission Condition: poor    Discharged Condition: stable    Admission Circumstance:     The patient is a 51 y.o. female with significant past history of bipolar disorder  ??  ??Pt transported to ED by her sister. Pt states she is "ready to get off this ride". Pt reports to be using alcohol and cocaine/crack 3-4x a week. Pt states she is ready to quit. Pt report she has been sober for 20 years and then relapsed 1 year ago after her son was almost killed in a traumatic car accident leaving him disabled. Pt states her son's health, her own extensive medical problems, life stress and her Dr changing her medication and putting her on new medication that did not work intensified everything and triggered her to relapse. Pt states she knows what to do to stay sober but she needs to be put on the right medicine. Pt states she has been self-medicating with alcohol and cocaine. Pt admits she has not been taking her medications for a few days because she has been using drugs. Pt states she wants to get back into treatment with [facility] and [facility]. A peer support from [facility] arrived to ED to speak with pt and family regarding options for treatment.  ??  During interview, pt is in her room  Refusing to cooperate during interview  Pt report feeling depressed and suicidal prior to admission  Dozing off to sleep  Want to talk to me later or tomorrow morning  ??  Pt reported that she has been feeling depressed  Patient report depressive symptoms, rating mood to be around 2/10 (10- good)  Was manic prior to the depression when she feel manic, poor sleep impulsive and risky behavior  Pt has hopeless, worthless and helpless feeling  Suicidal thoughts  - passive  Anxious about ongoing stressor, still ruminating about it  Pt has poor concentration, anhedonia, decrease motivation  ??  Medications Prior to Admission:   Prescriptions Prior to Admission: OLANZapine (ZYPREXA) 15 MG tablet, Take 15 mg by mouth nightly  tiZANidine (ZANAFLEX) 4 MG tablet, Take 4 mg by mouth 3 times daily  melatonin 1 MG tablet, Take 5 tablets by mouth nightly  warfarin (COUMADIN) 5 MG tablet, Take as directed by [facility] Anticoagulation Management Service. Quantity equals 30 day supply.  metoprolol tartrate (LOPRESSOR) 25 MG tablet, Take 1 tablet by mouth 2 times daily  gabapentin (NEURONTIN) 400 MG capsule, Take 800 mg by mouth nightly  prazosin (MINIPRESS) 5 MG capsule, Take 10 mg by mouth nightly  diphenhydrAMINE (BENADRYL) 50 MG capsule, Take 100 mg by mouth nightly  ibuprofen (ADVIL;MOTRIN) 800 MG tablet, Take 0.5 tablets by mouth every 8 hours as needed for Pain or Fever  lithium 300 MG capsule, Take 1 capsule by mouth 2 times daily for 14 days  fluticasone-salmeterol (ADVAIR) 250-50 MCG/DOSE AEPB, Inhale 1 puff into the lungs 2 times daily  albuterol sulfate HFA 108 (90 BASE) MCG/ACT inhaler, Inhale 2 puffs into the lungs every 4 hours as needed for Wheezing  atorvastatin (LIPITOR) 80 MG tablet, Take 1 tablet by mouth nightly  aspirin  81 MG tablet, Take 81 mg by mouth daily  folic acid (FOLVITE) 1 MG tablet, Take 1 mg by mouth daily   ipratropium-albuterol (DUONEB) 0.5-2.5 (3) MG/3ML SOLN nebulizer solution,   NITROSTAT 0.4 MG SL tablet,   omeprazole (PRILOSEC) 20 MG capsule, Take 20 mg by mouth Daily   RA VITAMIN B-6 50 MG tablet, Take 100 mg by mouth daily   ??  Compliance:no  ??  ??  Substance Abuse History:  ETOH: 2 beers daily  Marijuana: no  Opiates: no  Other Drugs: cocaine on and off 2 times per week  ??  Past Psychiatric History:  Prior Diagnosis:  Bipolar I disorder; depressed episode, addiction  Psychiatrist: Dr Darin Engels  Therapist:no  Hospitalization: yes  Hx of Suicidal  Attempts: yes  Hx of violence:  no  ECT: no  Previous discontinued Psychiatric Med Trials: not recall      PAST MEDICAL/PSYCHIATRIC HISTORY:   Past Medical History:   Diagnosis Date   ??? CAD (coronary artery disease)    ??? Cardiac angina (HCC)    ??? CHF (congestive heart failure) (HCC)    ??? COPD (chronic obstructive pulmonary disease) (HCC)    ??? 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 GERD (gastroesophageal reflux disease)    ??? History of blood transfusion    ??? Hyperlipidemia    ??? Hypertension    ??? MI (myocardial infarction)    ??? Neuromuscular disorder (HCC)    ??? Osteoarthritis    ??? Pneumonia    ??? Psychiatric problem    ??? Pulmonary embolism (HCC)    ??? Scoliosis        FAMILY/SOCIAL HISTORY:  Family History   Problem Relation Age of Onset   ??? Cirrhosis Mother    ??? Other Mother      Hepatitis B   ??? High Blood Pressure Father    ??? Cancer Maternal Grandmother    ??? Arthritis Maternal Grandmother    ??? Cancer Paternal Grandmother    ??? Scoliosis Paternal Grandmother    ??? High Blood Pressure Paternal Grandfather      Social History     Social History   ??? Marital status: Single     Spouse name: N/A   ??? Number of children: N/A   ??? Years of education: N/A     Occupational History   ??? Not on file.     Social History Main Topics   ??? Smoking status: Current Every Day Smoker     Packs/day: 0.50     Years: 38.00     Types: Cigarettes   ??? Smokeless tobacco: Not on file   ??? Alcohol use Yes      Comment: 3-4x week   ??? Drug use: Yes     Types: Cocaine      Comment: 3-4x/week   ??? Sexual activity: Not Currently     Other Topics Concern   ??? Not on file     Social History Narrative   ??? No narrative on file       MEDICATIONS:    Current Facility-Administered Medications:   ???  nitroGLYCERIN (NITROSTAT) 0.4 MG SL tablet, , , ,   ???  OXcarbazepine (TRILEPTAL) tablet 150 mg, 150 mg, Oral, BID, Gillermina Hu, MD, 150 mg at 02/25/16 0906  ???  nicotine polacrilex (NICORETTE) gum 4 mg, 4 mg, Oral, PRN, Gillermina Hu, MD, 4 mg at 02/25/16  0819  ???  albuterol sulfate HFA 108 (90 Base)  MCG/ACT inhaler 2 puff, 2 puff, Inhalation, Q4H PRN, Desma Paganini, MD, 2 puff at 02/24/16 1750  ???  aspirin chewable tablet 81 mg, 81 mg, Oral, Daily, Desma Paganini, MD, 81 mg at 02/25/16 0908  ???  atorvastatin (LIPITOR) tablet 80 mg, 80 mg, Oral, Nightly, Desma Paganini, MD, 80 mg at 02/24/16 2037  ???  diphenhydrAMINE (BENADRYL) capsule 50 mg, 50 mg, Oral, Nightly, Desma Paganini, MD, 50 mg at 02/24/16 2037  ???  folic acid (FOLVITE) tablet 1 mg, 1 mg, Oral, Daily, Desma Paganini, MD, 1 mg at 02/25/16 1610  ???  gabapentin (NEURONTIN) capsule 800 mg, 800 mg, Oral, Nightly, Desma Paganini, MD, 800 mg at 02/24/16 2036  ???  ibuprofen (ADVIL;MOTRIN) tablet 800 mg, 800 mg, Oral, Q8H PRN, Desma Paganini, MD, 800 mg at 02/25/16 0743  ???  melatonin tablet 5 mg, 5 mg, Oral, Nightly, Desma Paganini, MD, 5 mg at 02/24/16 2036  ???  metoprolol tartrate (LOPRESSOR) tablet 25 mg, 25 mg, Oral, BID, Desma Paganini, MD, 25 mg at 02/25/16 0906  ???  OLANZapine (ZYPREXA) tablet 15 mg, 15 mg, Oral, Nightly, Desma Paganini, MD, 15 mg at 02/24/16 2036  ???  prazosin (MINIPRESS) capsule 10 mg, 10 mg, Oral, Nightly, Desma Paganini, MD, 10 mg at 02/24/16 2036  ???  tiZANidine (ZANAFLEX) tablet 4 mg, 4 mg, Oral, TID, Desma Paganini, MD, 4 mg at 02/25/16 9604  ???  ondansetron (ZOFRAN) injection 4 mg, 4 mg, Intravenous, Q6H PRN, Desma Paganini, MD  ???  vitamin B-1 (THIAMINE) tablet 100 mg, 100 mg, Oral, Daily, Desma Paganini, MD, 100 mg at 02/25/16 5409  ???  multivitamin 1 tablet, 1 tablet, Oral, Daily, Desma Paganini, MD, 1 tablet at 02/25/16 8119  ???  hydrOXYzine (VISTARIL) injection 50 mg, 50 mg, Intramuscular, Q6H PRN **OR** hydrOXYzine (VISTARIL) capsule 50 mg, 50 mg, Oral, Q6H PRN, Desma Paganini, MD, 50 mg at 02/24/16 2359  ???  haloperidol (HALDOL) tablet 5 mg, 5 mg, Oral, Q6H PRN **OR** haloperidol lactate (HALDOL) injection 5 mg, 5 mg, Intramuscular, Q6H PRN, Desma Paganini, MD  ???  traZODone (DESYREL) tablet  50 mg, 50 mg, Oral, Nightly PRN, Desma Paganini, MD, 50 mg at 02/24/16 2036  ???  mometasone-formoterol (DULERA) 200-5 MCG/ACT inhaler 2 puff, 2 puff, Inhalation, BID, Desma Paganini, MD, 2 puff at 02/25/16 0908  ???  pantoprazole (PROTONIX) tablet 40 mg, 40 mg, Oral, QAM AC, Desma Paganini, MD, 40 mg at 02/25/16 0610  ???  warfarin (COUMADIN) daily dosing (placeholder), , Other, RX Placeholder, Alveta Heimlich, APRN  ???  nicotine polacrilex (NICORETTE) gum 2 mg, 2 mg, Oral, Once, Lilly Cove, PA-C    Examination:  BP (!) 138/97    Pulse 73    Temp 97 ??F (36.1 ??C) (Oral)    Resp 20    Ht 5\' 4"  (1.626 m)    Wt 150 lb (68 kg)    SpO2 97%    BMI 25.75 kg/m??   Gait - steady    HOSPITAL COURSE::  Following admission to the hospital, patient had a complete physical exam and blood work up  Patient was monitored closely with suicide precaution  Patient was started on meds as above  Was encouraged to participate in group and other milieu activity  Patient started to feel better with this combination of treatment.  Significant progress in the symptoms since admission.    Mood better, with the score of 2/10 - bad  No AVH or paranoid thoughts  No Hopeless or  worthless feeling  No active SI/HI  Appetite:  [x]  Normal  []  Increased  []  Decreased    Sleep:       [x]  Normal  []  Fair       []  Poor            Energy:    [x]  Normal  []  Increased  []  Decreased     SI []  Present  [x]  Absent  HI  [] Present  [x]  Absent   Aggression:  []  yes  []  no  Patient is [x]  able  []  unable to CONTRACT FOR SAFETY   Medication side effects(SE):  [x]  None(Psych. Meds.) []  Other      Mental Status Examination on discharge:    Level of consciousness:  within normal limits   Appearance:  well-appearing  Behavior/Motor:  no abnormalities noted  Attitude toward examiner:  attentive and good eye contact  Speech:  spontaneous, normal rate and normal volume   Mood: euthymic  Affect:  mood congruent  Thought processes:  linear and goal directed   Thought content:   Suicidal Ideation:  denies suicidal ideation  Delusions:  no evidence of delusions  Perceptual Disturbance:  denies any perceptual disturbance  Cognition:  oriented to person, place, and time   Concentration intact  Memory intact  Insight good   Judgement fair   Fund of Knowledge adequate      ASSESSMENT:  Patient symptoms are:  [x]  Well controlled  [x]  Improving  []  Worsening  []  No change      Diagnosis:  Principal Problem:    Bipolar depression (HCC)  Active Problems:    Cocaine use disorder, severe, dependence (HCC)      LABS:    No results for input(s): WBC, HGB, PLT in the last 72 hours.  No results for input(s): NA, K, CL, CO2, BUN, CREATININE, GLUCOSE in the last 72 hours.  No results for input(s): BILITOT, ALKPHOS, AST, ALT in the last 72 hours.  Lab Results   Component Value Date    LABAMPH Neg 02/18/2016    BARBSCNU Neg 02/18/2016    LABBENZ Neg 02/18/2016    LABBENZ NotDTCD 11/27/2010    OPIATESCREENURINE Neg 02/18/2016    PHENCYCLIDINESCREENURINE Neg 02/18/2016    ETOH 45 02/18/2016     Lab Results   Component Value Date    TSH 0.552 02/18/2016     Lab Results   Component Value Date    LITHIUM 0.2 (L) 08/04/2015     No results found for: VALPROATE, CBMZ    RISK ASSESSMENT AT DISCHARGE: Low risk for suicide and homicide.     Treatment Plan:  Reviewed current Medications with the patient. Education provided on the complaince with treatment.  Risks, benefits, side effects, drug-to-drug interactions and alternatives to treatment were discussed.  Encourage patient to attend outpatient follow up appointment and therapy.  Discharge planning discussed with the patient including follow up plan as documented in the follow up plan section.  Patient expressed understanding of the condition and treatment plan.  CD rehab on discharge  Pt working with [facility] to get into residential rehab       Medication List      START taking these medications    hydrOXYzine 50 MG capsule  Commonly known as:  VISTARIL  Take 1  capsule by mouth every 6 hours as needed for Anxiety     OXcarbazepine 150 MG tablet  Commonly known as:  TRILEPTAL  Take 1 tablet by mouth  2 times daily        CONTINUE taking these medications    albuterol sulfate HFA 108 (90 Base) MCG/ACT inhaler     aspirin 81 MG tablet     atorvastatin 80 MG tablet  Commonly known as:  LIPITOR  Take 1 tablet by mouth nightly     fluticasone-salmeterol 250-50 MCG/DOSE Aepb  Commonly known as:  ADVAIR     folic acid 1 MG tablet  Commonly known as:  FOLVITE     gabapentin 400 MG capsule  Commonly known as:  NEURONTIN     ibuprofen 800 MG tablet  Commonly known as:  ADVIL;MOTRIN  Take 0.5 tablets by mouth every 8 hours as needed for Pain or Fever     ipratropium-albuterol 0.5-2.5 (3) MG/3ML Soln nebulizer solution  Commonly known as:  DUONEB     melatonin 1 MG tablet  Take 5 tablets by mouth nightly     metoprolol tartrate 25 MG tablet  Commonly known as:  LOPRESSOR  Take 1 tablet by mouth 2 times daily     NITROSTAT 0.4 MG SL tablet  Generic drug:  nitroGLYCERIN     OLANZapine 15 MG tablet  Commonly known as:  ZYPREXA  Take 1 tablet by mouth nightly     omeprazole 20 MG delayed release capsule  Commonly known as:  PRILOSEC     prazosin 5 MG capsule  Commonly known as:  MINIPRESS  Take 2 capsules by mouth nightly     RA VITAMIN B-6 50 MG tablet  Generic drug:  pyridoxine     tiZANidine 4 MG tablet  Commonly known as:  ZANAFLEX        STOP taking these medications    diphenhydrAMINE 50 MG capsule  Commonly known as:  BENADRYL     lithium 300 MG capsule        ASK your doctor about these medications    warfarin 5 MG tablet  Commonly known as:  COUMADIN  Take as directed by [facility] Anticoagulation Management Service. Quantity equals 30 day supply.           Where to Get Your Medications      These medications were sent to [facility] [address], [place] - [address] - P [phone] Carmon Ginsberg [phone]  [address], [place] [address]    Phone:   [phone]   ?? hydrOXYzine 50 MG capsule  ?? melatonin 1 MG tablet  ?? OLANZapine 15 MG tablet  ?? OXcarbazepine 150 MG tablet  ?? prazosin 5 MG capsule         TIME SPEND - 35 MINUTES TO COMPLETE THE EVALUATION, DISCHARGE SUMMARY, MEDICATION RECONCILIATION AND FOLLOW UP CARE     SignedGillermina Hu  02/25/2016  9:13 AM

## 2016-02-25 NOTE — Progress Notes (Signed)
Pt. declined to attend the 0900 community meeting, despite staff encouragement. Electronically signed by Mliss SaxPatricia A Vonna Brabson, CTRS on 02/25/2016 at 1:44 PM

## 2016-02-25 NOTE — Progress Notes (Signed)
Affect and mood stable  Denies suicidal/homicidal ideation  Reviewed and verbalized understanding of all instructions   Belongings returned to patient  D/c with cab voucher for transport home

## 2016-03-04 ENCOUNTER — Encounter: Payer: PRIVATE HEALTH INSURANCE | Primary: Family Medicine

## 2016-03-24 ENCOUNTER — Encounter: Attending: Family | Primary: Family Medicine

## 2016-03-25 ENCOUNTER — Encounter: Attending: Family Medicine | Primary: Family Medicine

## 2016-03-25 NOTE — Progress Notes (Signed)
This encounter was created in error - please disregard.

## 2016-03-30 ENCOUNTER — Encounter: Attending: Family | Primary: Family Medicine

## 2016-03-31 ENCOUNTER — Emergency Department: Payer: PRIVATE HEALTH INSURANCE | Primary: Family Medicine

## 2016-03-31 ENCOUNTER — Emergency Department: Admit: 2016-03-31 | Payer: PRIVATE HEALTH INSURANCE | Primary: Family Medicine

## 2016-03-31 ENCOUNTER — Inpatient Hospital Stay
Admit: 2016-03-31 | Discharge: 2016-03-31 | Disposition: A | Discharge status: Home / Self Care | Payer: PRIVATE HEALTH INSURANCE | Attending: Emergency Medicine

## 2016-03-31 DIAGNOSIS — J441 Chronic obstructive pulmonary disease with (acute) exacerbation: Principal | ICD-10-CM

## 2016-03-31 LAB — COMPREHENSIVE METABOLIC PANEL
ALT: 9 U/L (ref 0–33)
AST: 23 U/L (ref 0–35)
Albumin: 3.6 g/dL — ABNORMAL LOW (ref 3.9–4.9)
Alkaline Phosphatase: 78 U/L (ref 40–130)
Anion Gap: 11 mEq/L (ref 7–13)
BUN: 10 mg/dL (ref 6–20)
CO2: 22 mEq/L (ref 22–29)
Calcium: 8.5 mg/dL — ABNORMAL LOW (ref 8.6–10.2)
Chloride: 107 mEq/L (ref 98–107)
Creatinine: 1.03 mg/dL — ABNORMAL HIGH (ref 0.50–0.90)
GFR African American: >60 (ref >60)
GFR Non-African American: 56.5 — ABNORMAL LOW (ref >60)
Globulin: 2.3 g/dL (ref 2.3–3.5)
Glucose: 100 mg/dL (ref 74–109)
Potassium: 4 mEq/L (ref 3.5–5.1)
Sodium: 140 mEq/L (ref 132–144)
Total Bilirubin: 0.2 mg/dL (ref 0.0–1.2)
Total Protein: 5.9 g/dL — ABNORMAL LOW (ref 6.4–8.1)

## 2016-03-31 LAB — CBC WITH AUTO DIFFERENTIAL
Basophils %: 0.3 %
Basophils Absolute: 0 10*3/uL (ref 0.0–0.2)
Eosinophils %: 3.1 %
Eosinophils Absolute: 0.2 10*3/uL (ref 0.0–0.7)
Hematocrit: 34.2 % — ABNORMAL LOW (ref 37.0–47.0)
Hemoglobin: 11.1 g/dL — ABNORMAL LOW (ref 12.0–16.0)
Lymphocytes %: 44.3 %
Lymphocytes Absolute: 2.2 10*3/uL (ref 1.0–4.8)
MCH: 31 pg (ref 27.0–31.3)
MCHC: 32.5 % — ABNORMAL LOW (ref 33.0–37.0)
MCV: 95.2 fL (ref 82.0–100.0)
Monocytes %: 11.6 %
Monocytes Absolute: 0.6 10*3/uL (ref 0.2–0.8)
Neutrophils %: 40.7 %
Neutrophils Absolute: 2 10*3/uL (ref 1.4–6.5)
Platelets: 265 10*3/uL (ref 130–400)
RBC: 3.59 M/uL — ABNORMAL LOW (ref 4.20–5.40)
RDW: 14 % (ref 11.5–14.5)
WBC: 5 10*3/uL (ref 4.8–10.8)

## 2016-03-31 LAB — EKG 12-LEAD
Atrial Rate: 65 {beats}/min
P Axis: 71 degrees
P-R Interval: 178 ms
Q-T Interval: 404 ms
QRS Duration: 86 ms
QTc Calculation (Bazett): 420 ms
R Axis: -5 degrees
T Axis: 27 degrees
Ventricular Rate: 65 {beats}/min

## 2016-03-31 LAB — TROPONIN: Troponin: <0.01 ng/mL (ref 0.000–0.010)

## 2016-03-31 LAB — PROTIME-INR
INR: 3.3
Protime: 34.8 s — ABNORMAL HIGH (ref 8.1–13.7)

## 2016-03-31 LAB — APTT: aPTT: 39.3 s — ABNORMAL HIGH (ref 21.6–35.4)

## 2016-03-31 LAB — MAGNESIUM: Magnesium: 2 mg/dL (ref 1.7–2.3)

## 2016-03-31 MED ORDER — IOPAMIDOL 76 % IV SOLN
76 % | Freq: Once | INTRAVENOUS | Status: DC
Start: 2016-03-31 — End: 2016-03-31

## 2016-03-31 MED ORDER — METHYLPREDNISOLONE SODIUM SUCC 125 MG IJ SOLR
125 MG | Freq: Once | INTRAMUSCULAR | Status: AC
Start: 2016-03-31 — End: 2016-03-31
  Administered 2016-03-31: 17:00:00 125 mg via INTRAVENOUS

## 2016-03-31 MED ORDER — KETOROLAC TROMETHAMINE 30 MG/ML IJ SOLN
30 MG/ML | Freq: Once | INTRAMUSCULAR | Status: AC
Start: 2016-03-31 — End: 2016-03-31
  Administered 2016-03-31: 17:00:00 30 mg via INTRAVENOUS

## 2016-03-31 MED ORDER — PREDNISONE 20 MG PO TABS
20 MG | ORAL_TABLET | Freq: Every day | ORAL | 0 refills | Status: AC
Start: 2016-03-31 — End: 2016-04-10

## 2016-03-31 MED ORDER — SODIUM CHLORIDE 0.9 % IV BOLUS
0.9 % | Freq: Once | INTRAVENOUS | Status: AC
Start: 2016-03-31 — End: 2016-03-31
  Administered 2016-03-31: 17:00:00 1000 mL via INTRAVENOUS

## 2016-03-31 MED ORDER — LEVOFLOXACIN IN D5W 750 MG/150ML IV SOLN
750 MG/150ML | Freq: Once | INTRAVENOUS | Status: AC
Start: 2016-03-31 — End: 2016-03-31
  Administered 2016-03-31: 20:00:00 750 mg via INTRAVENOUS

## 2016-03-31 MED ORDER — HYDROMORPHONE HCL 1 MG/ML IJ SOLN
1 MG/ML | INTRAMUSCULAR | Status: AC | PRN
Start: 2016-03-31 — End: 2016-03-31
  Administered 2016-03-31 (×2): 1 mg via INTRAVENOUS

## 2016-03-31 MED ORDER — ONDANSETRON HCL 4 MG/2ML IJ SOLN
4 | Freq: Once | INTRAMUSCULAR | Status: AC
Start: 2016-03-31 — End: 2016-03-31
  Administered 2016-03-31: 17:00:00 4 mg via INTRAVENOUS

## 2016-03-31 MED ORDER — LEVOFLOXACIN 750 MG PO TABS
750 MG | ORAL_TABLET | Freq: Every day | ORAL | 0 refills | Status: AC
Start: 2016-03-31 — End: 2016-04-10

## 2016-03-31 MED ORDER — ALBUTEROL SULFATE (2.5 MG/3ML) 0.083% IN NEBU
RESPIRATORY_TRACT | Status: AC
Start: 2016-03-31 — End: 2016-03-31
  Administered 2016-03-31 (×2): 2.5 mg via RESPIRATORY_TRACT

## 2016-03-31 MED FILL — SOLU-MEDROL 125 MG IJ SOLR: 125 MG | INTRAMUSCULAR | Qty: 125

## 2016-03-31 MED FILL — HYDROMORPHONE HCL 1 MG/ML IJ SOLN: 1 MG/ML | INTRAMUSCULAR | Qty: 1

## 2016-03-31 MED FILL — ALBUTEROL SULFATE (2.5 MG/3ML) 0.083% IN NEBU: RESPIRATORY_TRACT | Qty: 3

## 2016-03-31 MED FILL — SODIUM CHLORIDE 0.9 % IV SOLN: 0.9 % | INTRAVENOUS | Qty: 1000

## 2016-03-31 MED FILL — LEVOFLOXACIN IN D5W 750 MG/150ML IV SOLN: 750 MG/150ML | INTRAVENOUS | Qty: 150

## 2016-03-31 MED FILL — ONDANSETRON HCL 4 MG/2ML IJ SOLN: 4 MG/2ML | INTRAMUSCULAR | Qty: 2

## 2016-03-31 MED FILL — KETOROLAC TROMETHAMINE 30 MG/ML IJ SOLN: 30 MG/ML | INTRAMUSCULAR | Qty: 1

## 2016-03-31 NOTE — ED Notes (Signed)
Pt given discharge instructions and scripts  Pt verbalized understanding.  Pt left er with sister to drive her home     Rod Can, RN  03/31/16 [phone]

## 2016-03-31 NOTE — ED Triage Notes (Signed)
Pt c/o sob and back and neck pain. Pt states she is nervous because she has had pe,s  In the past and also wonders if she may be having a heart atatck. Pt did take talwin this morning that was prescribed to her by her pain management dr but it is not helping. Pt seems anxious with multiple complaints on assessment. Respirations are unlabored. Pain in neck prevents pt from moving her head and pain increases with movement

## 2016-03-31 NOTE — ED Notes (Signed)
Pr refuses to have her blood pressure checked.  Pt asking for lunch and ice chips.  Pt given boxed lunch and ice chips.  Pt remains on cariac monitor and continuous pulse ox     Rod Can, RN  03/31/16 1255

## 2016-03-31 NOTE — Progress Notes (Signed)
This encounter was created in error - please disregard.

## 2016-03-31 NOTE — ED Notes (Signed)
Pt removed herself from cardiac monitoring,  Refuses any repeat vitals     Rod Can, RN  03/31/16 1531

## 2016-03-31 NOTE — ED Notes (Signed)
Ct scan called and states pts iv infiltrated.   Unable to obtain additional iv access.  Dr. Glenna Durand aware pe study not done due to lack of iv access     Rod Can, RN  03/31/16 1217
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
## 2016-04-13 ENCOUNTER — Encounter: Payer: PRIVATE HEALTH INSURANCE | Attending: Family | Primary: Family Medicine

## 2016-04-15 NOTE — Progress Notes (Signed)
This encounter was created in error - please disregard.

## 2016-05-01 ENCOUNTER — Inpatient Hospital Stay: Admit: 2016-05-01 | Payer: PRIVATE HEALTH INSURANCE | Primary: Family Medicine

## 2016-05-01 ENCOUNTER — Encounter

## 2016-05-01 DIAGNOSIS — R52 Pain, unspecified: Secondary | ICD-10-CM

## 2016-05-11 ENCOUNTER — Encounter: Attending: Family Medicine | Primary: Family Medicine

## 2016-05-13 ENCOUNTER — Encounter: Attending: Family Medicine | Primary: Family Medicine

## 2016-05-13 NOTE — Telephone Encounter (Signed)
Alisha Mccall was a new patient for you today that "no-showed".  It has been our doctors protocol to dismiss the new patient no show from this specific office.  I'm not sure how you would like this handled for your new patient's, please advise.

## 2016-05-13 NOTE — Telephone Encounter (Signed)
Done

## 2016-05-13 NOTE — Telephone Encounter (Signed)
Please discharge patient

## 2016-05-14 DIAGNOSIS — F314 Bipolar disorder, current episode depressed, severe, without psychotic features: Principal | ICD-10-CM

## 2016-05-14 NOTE — ED Notes (Signed)
Pt with pt who is A&OX4.  Requested NP see her for medical screening.     Melven Sartorius, RN  05/14/16 2351

## 2016-05-14 NOTE — ED Notes (Signed)
Pt unable to provide urine at this time     Dionta Larke D. Christell ConstantMoore, RN  05/14/16 2144

## 2016-05-14 NOTE — ED Notes (Signed)
Pt's chart to physician      Gizella Belleville D. Christell Constant, RN  05/14/16 2200

## 2016-05-14 NOTE — ED Notes (Signed)
Lab here to draw pt      Orphia Mctigue D. Christell Constant, RN  05/14/16 2203

## 2016-05-14 NOTE — ED Triage Notes (Signed)
Pt transported by her sister/home health aide. Pt lives alone and sister worried about her. Sister reports she is suicidal and afraid she may do something no attempt made tonight.
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
## 2016-05-14 NOTE — ED Notes (Signed)
Lab notified for blood draw      Tranesha Lessner D. Christell Constant, RN  05/14/16 2156

## 2016-05-14 NOTE — ED Notes (Signed)
Pt reminded of need for U/A spec to complete labs ordered      Sullivan Blasing D. Christell Constant, RN  05/14/16 2250

## 2016-05-15 ENCOUNTER — Inpatient Hospital Stay
Admit: 2016-05-15 | Discharge: 2016-05-20 | Disposition: A | Discharge status: Home / Self Care | Payer: PRIVATE HEALTH INSURANCE | Source: Ambulatory Visit | Admitting: Psychiatry

## 2016-05-15 DIAGNOSIS — F314 Bipolar disorder, current episode depressed, severe, without psychotic features: Principal | ICD-10-CM

## 2016-05-15 LAB — CBC WITH AUTO DIFFERENTIAL
Basophils %: 0.9 %
Basophils Absolute: 0.1 10*3/uL (ref 0.0–0.2)
Eosinophils %: 2.1 %
Eosinophils Absolute: 0.1 10*3/uL (ref 0.0–0.7)
Hematocrit: 39.6 % (ref 37.0–47.0)
Hemoglobin: 13.2 g/dL (ref 12.0–16.0)
Lymphocytes %: 26.9 %
Lymphocytes Absolute: 1.8 10*3/uL (ref 1.0–4.8)
MCH: 31.3 pg (ref 27.0–31.3)
MCHC: 33.3 % (ref 33.0–37.0)
MCV: 94.1 fL (ref 82.0–100.0)
Monocytes %: 9.7 %
Monocytes Absolute: 0.6 10*3/uL (ref 0.2–0.8)
Neutrophils %: 60.4 %
Neutrophils Absolute: 4.1 10*3/uL (ref 1.4–6.5)
Platelets: 285 10*3/uL (ref 130–400)
RBC: 4.21 M/uL (ref 4.20–5.40)
RDW: 15.5 % — ABNORMAL HIGH (ref 11.5–14.5)
WBC: 6.7 10*3/uL (ref 4.8–10.8)

## 2016-05-15 LAB — COMPREHENSIVE METABOLIC PANEL
ALT: 7 U/L (ref 0–33)
AST: 13 U/L (ref 0–35)
Albumin: 3.8 g/dL — ABNORMAL LOW (ref 3.9–4.9)
Alkaline Phosphatase: 82 U/L (ref 40–130)
Anion Gap: 15 mEq/L — ABNORMAL HIGH (ref 7–13)
BUN: 12 mg/dL (ref 6–20)
CO2: 20 mEq/L — ABNORMAL LOW (ref 22–29)
Calcium: 9 mg/dL (ref 8.6–10.2)
Chloride: 105 mEq/L (ref 98–107)
Creatinine: 0.93 mg/dL — ABNORMAL HIGH (ref 0.50–0.90)
GFR African American: >60 (ref >60)
GFR Non-African American: >60 (ref >60)
Globulin: 2.8 g/dL (ref 2.3–3.5)
Glucose: 124 mg/dL — ABNORMAL HIGH (ref 74–109)
Potassium: 4.1 mEq/L (ref 3.5–5.1)
Sodium: 140 mEq/L (ref 132–144)
Total Bilirubin: 0.6 mg/dL (ref 0.0–1.2)
Total Protein: 6.6 g/dL (ref 6.4–8.1)

## 2016-05-15 LAB — URINE DRUG SCREEN
Amphetamine Screen, Urine: POSITIVE — AB (ref <1000)
Barbiturate Screen, Ur: NEGATIVE (ref <200)
Benzodiazepine Screen, Urine: NEGATIVE (ref <200)
Cannabinoid Scrn, Ur: NEGATIVE (ref <50)
Cocaine Metabolite Screen, Urine: POSITIVE — AB (ref <300)
Opiate Scrn, Ur: NEGATIVE (ref <300)
PCP Screen, Urine: NEGATIVE (ref <25)

## 2016-05-15 LAB — ETHANOL: Ethanol Lvl: <10 mg/dL

## 2016-05-15 LAB — TSH: TSH: 1.29 u[IU]/mL (ref 0.270–4.200)

## 2016-05-15 LAB — PREGNANCY, URINE: HCG(Urine) Pregnancy Test: NEGATIVE

## 2016-05-15 MED ORDER — ACETAMINOPHEN 325 MG PO TABS
325 MG | ORAL | Status: DC | PRN
Start: 2016-05-15 — End: 2016-05-20
  Administered 2016-05-17: 01:00:00 650 mg via ORAL

## 2016-05-15 MED ORDER — CHLORDIAZEPOXIDE HCL 25 MG PO CAPS
25 MG | ORAL | Status: DC | PRN
Start: 2016-05-15 — End: 2016-05-18

## 2016-05-15 MED ORDER — HALOPERIDOL 5 MG PO TABS
5 MG | Freq: Four times a day (QID) | ORAL | Status: DC | PRN
Start: 2016-05-15 — End: 2016-05-20

## 2016-05-15 MED ORDER — HALOPERIDOL LACTATE 5 MG/ML IJ SOLN
5 MG/ML | Freq: Four times a day (QID) | INTRAMUSCULAR | Status: DC | PRN
Start: 2016-05-15 — End: 2016-05-20

## 2016-05-15 MED ORDER — MELATONIN 3 MG PO TABS
3 MG | Freq: Every evening | ORAL | Status: DC
Start: 2016-05-15 — End: 2016-05-20
  Administered 2016-05-16 – 2016-05-20 (×5): 5 mg via ORAL

## 2016-05-15 MED ORDER — LORAZEPAM 1 MG PO TABS
1 MG | Freq: Once | ORAL | Status: AC
Start: 2016-05-15 — End: 2016-05-15
  Administered 2016-05-15: 05:00:00 2 mg via ORAL

## 2016-05-15 MED ORDER — METOPROLOL TARTRATE 25 MG PO TABS
25 MG | Freq: Two times a day (BID) | ORAL | Status: DC
Start: 2016-05-15 — End: 2016-05-20
  Administered 2016-05-15 – 2016-05-20 (×10): 25 mg via ORAL

## 2016-05-15 MED ORDER — HYDROXYZINE HCL 50 MG/ML IM SOLN
50 MG/ML | Freq: Four times a day (QID) | INTRAMUSCULAR | Status: DC | PRN
Start: 2016-05-15 — End: 2016-05-20

## 2016-05-15 MED ORDER — TAB-A-VITE PO TABS
Freq: Every day | ORAL | Status: DC
Start: 2016-05-15 — End: 2016-05-20
  Administered 2016-05-15 – 2016-05-20 (×6): 1 via ORAL

## 2016-05-15 MED ORDER — LORAZEPAM 1 MG PO TABS
1 MG | ORAL | Status: DC | PRN
Start: 2016-05-15 — End: 2016-05-18

## 2016-05-15 MED ORDER — FOLIC ACID 1 MG PO TABS
1 MG | Freq: Every day | ORAL | Status: DC
Start: 2016-05-15 — End: 2016-05-20
  Administered 2016-05-15 – 2016-05-20 (×6): 1 mg via ORAL

## 2016-05-15 MED ORDER — TIZANIDINE HCL 4 MG PO TABS
4 MG | Freq: Three times a day (TID) | ORAL | Status: DC
Start: 2016-05-15 — End: 2016-05-20
  Administered 2016-05-15 – 2016-05-20 (×16): 4 mg via ORAL

## 2016-05-15 MED ORDER — VITAMIN B-1 100 MG PO TABS
100 MG | Freq: Every day | ORAL | Status: DC
Start: 2016-05-15 — End: 2016-05-20
  Administered 2016-05-15 – 2016-05-20 (×6): 100 mg via ORAL

## 2016-05-15 MED ORDER — IBUPROFEN 400 MG PO TABS
400 MG | Freq: Three times a day (TID) | ORAL | Status: DC | PRN
Start: 2016-05-15 — End: 2016-05-20
  Administered 2016-05-16 – 2016-05-20 (×6): 400 mg via ORAL

## 2016-05-15 MED ORDER — VITAMIN B-6 100 MG PO TABS
100 MG | Freq: Every day | ORAL | Status: DC
Start: 2016-05-15 — End: 2016-05-20
  Administered 2016-05-15 – 2016-05-20 (×6): 100 mg via ORAL

## 2016-05-15 MED ORDER — GABAPENTIN 400 MG PO CAPS
400 MG | Freq: Every evening | ORAL | Status: DC
Start: 2016-05-15 — End: 2016-05-20
  Administered 2016-05-16 – 2016-05-20 (×5): 800 mg via ORAL

## 2016-05-15 MED ORDER — HYDROXYZINE PAMOATE 50 MG PO CAPS
50 MG | Freq: Four times a day (QID) | ORAL | Status: DC | PRN
Start: 2016-05-15 — End: 2016-05-20
  Administered 2016-05-15 – 2016-05-18 (×5): 50 mg via ORAL

## 2016-05-15 MED ORDER — ALUM & MAG HYDROXIDE-SIMETH 200-200-20 MG/5ML PO SUSP
200-200-20 MG/5ML | ORAL | Status: DC | PRN
Start: 2016-05-15 — End: 2016-05-20

## 2016-05-15 MED ORDER — ALBUTEROL SULFATE HFA 108 (90 BASE) MCG/ACT IN AERS
108 (90 Base) MCG/ACT | RESPIRATORY_TRACT | Status: DC | PRN
Start: 2016-05-15 — End: 2016-05-20
  Administered 2016-05-20: 01:00:00 2 via RESPIRATORY_TRACT

## 2016-05-15 MED ORDER — PANTOPRAZOLE SODIUM 40 MG PO TBEC
40 MG | Freq: Every day | ORAL | Status: DC
Start: 2016-05-15 — End: 2016-05-20
  Administered 2016-05-16 – 2016-05-20 (×5): 40 mg via ORAL

## 2016-05-15 MED ORDER — ASPIRIN 81 MG PO CHEW
81 MG | Freq: Every day | ORAL | Status: DC
Start: 2016-05-15 — End: 2016-05-20
  Administered 2016-05-15 – 2016-05-20 (×6): 81 mg via ORAL

## 2016-05-15 MED ORDER — ATORVASTATIN CALCIUM 80 MG PO TABS
80 MG | Freq: Every evening | ORAL | Status: DC
Start: 2016-05-15 — End: 2016-05-20
  Administered 2016-05-16 – 2016-05-20 (×5): 80 mg via ORAL

## 2016-05-15 MED ORDER — IPRATROPIUM-ALBUTEROL 0.5-2.5 (3) MG/3ML IN SOLN
RESPIRATORY_TRACT | Status: DC | PRN
Start: 2016-05-15 — End: 2016-05-15

## 2016-05-15 MED ORDER — CHLORDIAZEPOXIDE HCL 10 MG PO CAPS
10 MG | ORAL | Status: DC | PRN
Start: 2016-05-15 — End: 2016-05-18

## 2016-05-15 MED ORDER — NITROGLYCERIN 0.4 MG SL SUBL
0.4 MG | SUBLINGUAL | Status: DC | PRN
Start: 2016-05-15 — End: 2016-05-20

## 2016-05-15 MED ORDER — BENZTROPINE MESYLATE 1 MG/ML IJ SOLN
1 MG/ML | Freq: Two times a day (BID) | INTRAMUSCULAR | Status: DC | PRN
Start: 2016-05-15 — End: 2016-05-20

## 2016-05-15 MED ORDER — MOMETASONE FURO-FORMOTEROL FUM 200-5 MCG/ACT IN AERO
200-5 MCG/ACT | Freq: Two times a day (BID) | RESPIRATORY_TRACT | Status: DC
Start: 2016-05-15 — End: 2016-05-20
  Administered 2016-05-16 – 2016-05-20 (×6): 2 via RESPIRATORY_TRACT

## 2016-05-15 MED ORDER — OLANZAPINE 7.5 MG PO TABS
7.5 MG | Freq: Every evening | ORAL | Status: DC
Start: 2016-05-15 — End: 2016-05-17
  Administered 2016-05-16 – 2016-05-17 (×2): 15 mg via ORAL

## 2016-05-15 MED ORDER — OXCARBAZEPINE 150 MG PO TABS
150 MG | Freq: Two times a day (BID) | ORAL | Status: DC
Start: 2016-05-15 — End: 2016-05-20
  Administered 2016-05-15 – 2016-05-20 (×11): 150 mg via ORAL

## 2016-05-15 MED FILL — LORAZEPAM 1 MG PO TABS: 1 MG | ORAL | Qty: 2

## 2016-05-15 MED FILL — B-1 100 MG PO TABS: 100 MG | ORAL | Qty: 1

## 2016-05-15 MED FILL — OXCARBAZEPINE 150 MG PO TABS: 150 MG | ORAL | Qty: 1

## 2016-05-15 MED FILL — GNP VITAMIN B-6 100 MG PO TABS: 100 MG | ORAL | Qty: 1

## 2016-05-15 MED FILL — FOLIC ACID 1 MG PO TABS: 1 MG | ORAL | Qty: 1

## 2016-05-15 MED FILL — HYDROXYZINE PAMOATE 50 MG PO CAPS: 50 MG | ORAL | Qty: 1

## 2016-05-15 MED FILL — PANTOPRAZOLE SODIUM 40 MG PO TBEC: 40 MG | ORAL | Qty: 1

## 2016-05-15 MED FILL — MOMETASONE FURO-FORMOTEROL FUM 200-5 MCG/ACT IN AERO: 200-5 MCG/ACT | RESPIRATORY_TRACT | Qty: 19.98

## 2016-05-15 MED FILL — METOPROLOL TARTRATE 25 MG PO TABS: 25 MG | ORAL | Qty: 1

## 2016-05-15 MED FILL — THEREMS PO TABS: ORAL | Qty: 1

## 2016-05-15 MED FILL — PROVENTIL HFA 108 (90 BASE) MCG/ACT IN AERS: 108 (90 Base) MCG/ACT | RESPIRATORY_TRACT | Qty: 2.66

## 2016-05-15 MED FILL — IPRATROPIUM-ALBUTEROL 0.5-2.5 (3) MG/3ML IN SOLN: RESPIRATORY_TRACT | Qty: 3

## 2016-05-15 MED FILL — TIZANIDINE HCL 4 MG PO TABS: 4 MG | ORAL | Qty: 1

## 2016-05-15 MED FILL — CHILDRENS ASPIRIN 81 MG PO CHEW: 81 MG | ORAL | Qty: 1
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
## 2016-05-15 NOTE — Progress Notes (Addendum)
Arrived on unit via wheelchair at 0210. Pt very sleepy from prn ativan given in er. Skin check on back ,arms negative. Pt declined further skin assessment. Denies current si,hi, no current visual hallucination, states it stayed downstairs. No auditory hallucinations.pt placed on rff/seizure precautions. ciwa initated as ordered. Bed alarm on. Taking fluids. Due to pt being medicated will defer admission til am

## 2016-05-15 NOTE — Progress Notes (Signed)
Pt. declined to attend the 0900 community meeting, despite staff encouragement. Electronically signed by Mliss Sax, CTRS on 05/15/2016 at 9:59 AM
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
## 2016-05-15 NOTE — Plan of Care (Signed)
Problem: Altered Mood, Psychotic Behavior:  Goal: Able to demonstrate trust by eating, participating in treatment and following staff's direction  Able to demonstrate trust by eating, participating in treatment and following staff's direction   Outcome: Ongoing    Goal: Able to verbalize decrease in frequency and intensity of hallucinations  Able to verbalize decrease in frequency and intensity of hallucinations   Outcome: Ongoing    Goal: Able to verbalize reality based thinking  Able to verbalize reality based thinking   Outcome: Ongoing    Goal: Absence of self-harm  Absence of self-harm   Outcome: Ongoing    Goal: Ability to interact with others will improve  Ability to interact with others will improve   Outcome: Met This Shift    Goal: Compliance with prescribed medication regimen will improve  Compliance with prescribed medication regimen will improve   Outcome: Ongoing

## 2016-05-15 NOTE — Progress Notes (Signed)
Pt. refused to attend the 1000 skills group, despite staff encouragement. Electronically signed by Mliss Sax, CTRS on 05/15/2016 at 11:00 AM

## 2016-05-15 NOTE — Other (Signed)
Pt has been lying in bed all day. Pt evasive/ blunt during interview. Pt did not even open eyes. Pt admits to feel depressed for at least 2 weeks. Pt still admits to have suicidal ideations with no plan and having visual hallucinations; says she sees a black cloud following her. Pt says her sleep and appetite have been varying from day to day. Pt admits to occasional use of cocaine and near daily use of alcohol. Pt denies any current stressors. Pt says her sister is her main social support.
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
## 2016-05-15 NOTE — Progress Notes (Signed)
[facility] Respiratory Therapy Evaluation   Current Order:  Alisha Mccall, Albuterol MDI Q4PRN      Home Regimen: Albuterol PRN       Ordering Physician: Desma Paganini, MD  Re-evaluation Date:       Diagnosis: Bipolar disorder      Patient Status: Stable / Unstable + Physician notified    The following MDI Criteria must be met in order to convert aerosol to MDI with spacer. If unable to meet, MDI will be converted to aerosol:  []   Patient able to demonstrate the ability to use MDI effectively  []   Patient alert and cooperative  []   Patient able to take deep breath with 5-10 second hold  []   Medication(s) available in this delivery method   []   Peak flow greater than or equal to 200 ml/min            Current Order Substituted To  (same drug, same frequency)   Aerosol to MDI []  Albuterol Sulfate 0.083% unit dose by aerosol Albuterol Sulfate MDI 2 puffs by inhalation with spacer    []  Levalbuterol 1.25 mg unit dose by aerosol Levalbuterol MDI 2 puffs by inhalation with spacer    []  Levalbuterol 0.63 mg unit dose by aerosol Levalbuterol MDI 2 puffs by inhalation with spacer    []  Ipratropium Bromide 0.02% unit dose by aerosol Ipratropium Bromide MDI 2 puffs by inhalation with spacer    []  Duoneb (Ipratropium + Albuterol) unit dose by aerosol Ipratropium MDI + Albuterol MDI 2 puffs by inhalation w/spacer   MDI to Aerosol []  Albuterol Sulfate MDI Albuterol Sulfate 0.083% unit dose by aerosol    []  Levalbuterol MDI 2 puffs by inhalation Levalbuterol 1.25 mg unit dose by aerosol    []  Ipratropium Bromide MDI by inhalation Ipratropium Bromide 0.02% unit dose by aerosol    []  Combivent (Ipratropium + Albuterol) MDI by inhalation Duoneb (Ipratropium + Albuterol) unit dose by aerosol   Treatment Assessment [Frequency/Schedule]:  Change frequency to: Albuterol 2.5mg  Q4PRN per Protocol, P&T, MEC      Points 0 1 2 3 4    Pulmonary Status  Non-Smoker  []    Smoking history   < 20 pack years  []    Smoking history  ? 20 pack  years  []    Pulmonary Disorder  (acute or chronic)  [x]    Severe or Chronic w/ Exacerbation  []      Surgical Status No [x]    Surgeries     General []    Surgery Lower []    Abdominal Thoracic or []    Upper Abdominal Thoracic with  PulmonaryDisorder  []      Chest X-ray Clear/Not  Ordered     [x]   Chronic Changes  Results Pending  []   Infiltrates, atelectasis, pleural effusion, or edema  []   Infiltrates in more than one lobe []   Infiltrate + Atelectasis, &/or pleural effusion  []     Respiratory Pattern Regular,  RR = 12-20 [x]   Increased,  RR = 21-25 []   DOE, irregular,  or RR = 26-30 []   Decreased FEV1  or RR = 31-35 []   Severe SOB, use  of accessory muscles, or RR ? 35  []     Mental Status Alert, oriented,  Cooperative [x]   Confused but Follows commands []   Lethargic or unable to follow commands []   Obtunded  []   Comatose  []     Breath Sounds Clear to  auscultation  [x]   Decreased unilaterally or  in bases only []   Decreased  bilaterally  []   Crackles or intermittent wheezes []   Wheezes []     Cough Strong, Spontan., & nonproductive [x]   Strong,  spontaneous, &  productive []   Weak,  Nonproductive []   Weak, productive or  with wheezes []   No spontaneous  cough or may require suctioning []     Level of Activity Ambulatory []   Ambulatory w/ Assist  [x]   Non-ambulatory []   Paraplegic []   Quadriplegic []     Total    Score: 4     Triage Score: 5      Tri       Triage:     1. (>20) Freq: Q3    2. (16-20) Freq: Q4   3. (11-15) Freq: QID & Albuterol Q2 PRN    4. (6-10) Freq: TID & Albuterol Q2 PRN    5. (0-5) Freq Q4prn

## 2016-05-15 NOTE — Care Coordination-Inpatient (Signed)
Pt did not attend group despite staff encouragement.   Electronically signed by Melven Sartorius, LPCC on 05/15/2016 at 11:59 AM
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
## 2016-05-15 NOTE — Plan of Care (Signed)
Problem: Altered Mood, Psychotic Behavior:  Goal: Able to demonstrate trust by eating, participating in treatment and following staff's direction  Able to demonstrate trust by eating, participating in treatment and following staff's direction   Outcome: Ongoing    Goal: Able to verbalize decrease in frequency and intensity of hallucinations  Able to verbalize decrease in frequency and intensity of hallucinations   Outcome: Ongoing    Goal: Able to verbalize reality based thinking  Able to verbalize reality based thinking   Outcome: Ongoing    Goal: Absence of self-harm  Absence of self-harm   Outcome: Ongoing    Goal: Ability to interact with others will improve  Ability to interact with others will improve   Outcome: Ongoing    Goal: Compliance with prescribed medication regimen will improve  Compliance with prescribed medication regimen will improve   Outcome: Ongoing      Problem: Substance Abuse:  Goal: Absence of drug withdrawal signs and symptoms  Absence of drug withdrawal signs and symptoms   Outcome: Ongoing    Goal: Participates in care planning  Participates in care planning   Outcome: Ongoing    Goal: Patient specific goal  Patient specific goal   Outcome: Ongoing

## 2016-05-15 NOTE — Other (Signed)
Approached pt to do interview, pt still lying in bed. Pt says she feels agitated right now, no other symptoms noted Pt medicated with vistaril. Pt now resting with eyes closed.

## 2016-05-15 NOTE — Progress Notes (Signed)
Pt oor at 2130 and socializing with female peers

## 2016-05-15 NOTE — Progress Notes (Signed)
Pt. was approached to complete the leisure assessment and refused. Very groggy and irritable. Assessment deferred until a later date. Electronically signed by Mliss Sax, CTRS on 05/15/2016 at 1:06 PM

## 2016-05-16 MED ORDER — NICOTINE POLACRILEX 4 MG MT GUM
4 MG | OROMUCOSAL | Status: DC | PRN
Start: 2016-05-16 — End: 2016-05-20
  Administered 2016-05-16 – 2016-05-19 (×8): 4 mg via ORAL

## 2016-05-16 MED FILL — TIZANIDINE HCL 4 MG PO TABS: 4 MG | ORAL | Qty: 1

## 2016-05-16 MED FILL — MELATONIN 1 MG PO TABS: 1 MG | ORAL | Qty: 2

## 2016-05-16 MED FILL — HYDROXYZINE PAMOATE 50 MG PO CAPS: 50 MG | ORAL | Qty: 1

## 2016-05-16 MED FILL — METOPROLOL TARTRATE 25 MG PO TABS: 25 MG | ORAL | Qty: 1

## 2016-05-16 MED FILL — CHILDRENS ASPIRIN 81 MG PO CHEW: 81 MG | ORAL | Qty: 1

## 2016-05-16 MED FILL — NICORELIEF 4 MG MT GUM: 4 MG | OROMUCOSAL | Qty: 1

## 2016-05-16 MED FILL — OLANZAPINE 7.5 MG PO TABS: 7.5 MG | ORAL | Qty: 2

## 2016-05-16 MED FILL — ATORVASTATIN CALCIUM 80 MG PO TABS: 80 MG | ORAL | Qty: 1

## 2016-05-16 MED FILL — IBUPROFEN 400 MG PO TABS: 400 MG | ORAL | Qty: 1

## 2016-05-16 MED FILL — OXCARBAZEPINE 150 MG PO TABS: 150 MG | ORAL | Qty: 1

## 2016-05-16 MED FILL — GABAPENTIN 400 MG PO CAPS: 400 MG | ORAL | Qty: 2

## 2016-05-16 MED FILL — THEREMS PO TABS: ORAL | Qty: 1

## 2016-05-16 MED FILL — PANTOPRAZOLE SODIUM 40 MG PO TBEC: 40 MG | ORAL | Qty: 1

## 2016-05-16 MED FILL — FOLIC ACID 1 MG PO TABS: 1 MG | ORAL | Qty: 1

## 2016-05-16 MED FILL — GNP VITAMIN B-6 100 MG PO TABS: 100 MG | ORAL | Qty: 1

## 2016-05-16 MED FILL — B-1 100 MG PO TABS: 100 MG | ORAL | Qty: 1

## 2016-05-16 NOTE — Progress Notes (Signed)
Pt. refused to attend the 1000 skills group, despite staff encouragement.Electronically signed by Mliss Sax, CTRS on 05/16/2016 at 11:05 AM

## 2016-05-16 NOTE — Progress Notes (Signed)
Pt requested her warfin, blood thinner stated she needs its because she gets blood clots, informed rn , pt reports getting it from the coumadin clinic at [place] [facility].

## 2016-05-16 NOTE — Behavioral Health Treatment Team (Signed)
Group Therapy Note    Date: 05/16/2016  Start Time: 2000  End Time:  2030  Number of Participants: 16    Type of Group: Wrap up group.     Pt attended group with high participation.     Start Time: 2030  End Time:  2045  Number of Participants: 17    Type of Group: Relaxation group.    Pt attended group with high participation.     Electronically signed by Malachi Pro, LPN on 1/61/0960 at 10:04 PM

## 2016-05-16 NOTE — Progress Notes (Signed)
Pt requesting coumadin. Jerrye Beavers NP notified with pt request. No INR in chart since admission.

## 2016-05-16 NOTE — Plan of Care (Signed)
Problem: Altered Mood, Psychotic Behavior:  Goal: Able to demonstrate trust by eating, participating in treatment and following staff's direction  Able to demonstrate trust by eating, participating in treatment and following staff's direction   Outcome: Ongoing    Goal: Able to verbalize decrease in frequency and intensity of hallucinations  Able to verbalize decrease in frequency and intensity of hallucinations   Outcome: Ongoing    Goal: Able to verbalize reality based thinking  Able to verbalize reality based thinking   Outcome: Ongoing    Goal: Absence of self-harm  Absence of self-harm   Outcome: Ongoing    Goal: Ability to interact with others will improve  Ability to interact with others will improve   Outcome: Ongoing    Goal: Compliance with prescribed medication regimen will improve  Compliance with prescribed medication regimen will improve   Outcome: Ongoing

## 2016-05-16 NOTE — Progress Notes (Signed)
BEHAVIORAL HEALTH FOLLOW-UP NOTE I    05/16/2016     Patient was seen and examined in person   Chart reviewed   Labs reviewed   Patient's case discussed with staff/team    Chief Complaint:   Depression, visual hallucinations, suicidal ideations    Interim History:     The patient said she is feeling "all right". She said her mood was "up and down". Still has suicidal ideations, but "not as bad". She denied having homicidal ideations. She denied having any hallucinations, paranoia. She said she slept "a little", and that her appetite was "better".    Appetite:   Normal/Unchanged   Increased   Decreased  SI  Present   Absent    Sleep:        Normal/Unchanged   Fair        Poor          HI  Present   Absent    Energy:     Normal/Unchanged   Increased   Decreased   Plan  Present                           Absent   N/A    Patient is  able   unable to CONTRACT FOR SAFETY   Aggression:   yes   no  Medication side effects(SE):   None(Psych. Meds.)  Other  Well tolerated:  yes   no    Examination:  BP 105/72    Pulse 83    Temp 98 ??F (36.7 ??C) (Oral)    Resp 18    Ht  (1.626 m)    Wt 187 lb (84.8 kg)    SpO2 100%    BMI 32.10 kg/m??     Appearance:  casual   anxious   confused   blunted   silly   poor hygiene   poor grooming  Gait:   stable   unstable   limping   shuffling   in wheel chair or other support    Mental Status:   Orientation: Date/Time:  yes   no Place:  yes   no     Person:  yes   no  Level of Consciousness:  alert   drowsy   tired   lethargic   distractable   asleep   could not be assessed    Manner:    Cooperative   Guarded   Suspicious   Irritable   Hostile   Withdrawn   Other    Motor Activity:    Normal   Agitation   Motor retardation   Tremor   Other    Musculoskeletal:    Normal   Rigidity   Cogwheel   Flaccid   Tics/TD   Other    Speech:     Normal   Soft   Slow   Dysarthria   Incoherent   Other    Language:   Normal   Expressive Aphasia   Fluent Aphasia   Other    Mood:   Euthymic   Depressed   Irritable   Angry   Anxious   Fearful   Apathetic   Euphoric   Other    Affect:   Euthymic   Depressed   Blunted   Flat   Irritable   Angry   Anxious     Labile   Expansive   Exaggerated   Other    Thought Process/Association:   [  x] Normal   Tangential   Circumstantial   Poverty of Thought   Concrete   Disorganized   Racing Thoughts   Flight of Ideas   Loose   Other    Thought Contents:    Hopelessness   Worthlessness   Hypochondriasis   Delusions   Paranoia   Ruminations   Obsessions/Compulsions   Confused  Hopeful    Future Oriented  Other    Perception:   Normal   Hallucinations   Auditory   Visual   Olfactory   Tactile     Dissociation   Flashbacks   Other    Attention/Concentration:   Intact   Poor   Distractible   Other    Cognition:   Intact   Impaired   Insight:  Intact   Fair   Limited   Short Term Memory:   Intact   Impaired   Poor  Judgement:   Intact   Fair   Limited  Remote Memory:   Intact   Impaired   Poor        BEHAVIORAL HEALTH FOLLOW-UP NOTE II    05/16/2016    PAST MEDICAL/PSYCHIATRIC HISTORY:   Past Medical History:   Diagnosis Date   ??? Anxiety    ??? CAD (coronary artery disease)    ??? Cardiac angina (HCC)    ??? CHF (congestive heart failure) (HCC)    ??? COPD (chronic obstructive pulmonary disease) (HCC)    ??? 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 GERD (gastroesophageal reflux disease)    ??? History of blood transfusion    ??? Hyperlipidemia    ??? Hypertension    ??? MI (myocardial infarction)    ??? Neuromuscular disorder (HCC)    ??? Osteoarthritis    ??? Pneumonia    ??? Psychiatric problem    ??? Pulmonary embolism (HCC)    ??? Scoliosis        FAMILY/SOCIAL HISTORY:  Family History    Problem Relation Age of Onset   ??? Cirrhosis Mother    ??? Other Mother      Hepatitis B   ??? High Blood Pressure Father    ??? Cancer Maternal Grandmother    ??? Arthritis Maternal Grandmother    ??? Cancer Paternal Grandmother    ??? Scoliosis Paternal Grandmother    ??? High Blood Pressure Paternal Grandfather      Social History     Social History   ??? Marital status: Single     Spouse name: N/A   ??? Number of children: N/A   ??? Years of education: N/A     Occupational History   ??? Not on file.     Social History Main Topics   ??? Smoking status: Current Every Day Smoker     Packs/day: 0.50     Years: 38.00     Types: Cigarettes   ??? Smokeless tobacco: Never Used   ??? Alcohol use Yes      Comment: recentyl mdrinking daily    ??? Drug use: Yes     Types: Cocaine      Comment: last used two days ago   ??? Sexual activity: Not Currently     Other Topics Concern   ??? Not on file     Social History Narrative   ??? No narrative on file       LABS:  Lab Results   Component Value Date    LITHIUM 0.2 (L) 08/04/2015  NA 140 05/14/2016    BUN 12 05/14/2016    CREATININE 0.93 (H) 05/14/2016    TSH 1.290 05/14/2016    WBC 6.7 05/14/2016     No results found for: PHENYTOIN, PHENOBARB, VALPROATE, CBMZ  Lab Results   Component Value Date    INR 3.3 03/31/2016    PROTIME 34.8 (H) 03/31/2016     Lab Results   Component Value Date    APTT 39.3 (H) 03/31/2016     Recent Labs      05/14/16   2202   ETOH  <10           ROS:   All negative/unchanged except if checked. Explain positive(checked items) below:   Constitutional   Eyes   Ear/Nose/Mouth/Throat   Respiratory   CV   GI   GU   Musculoskeletal   Skin/Breast   Neurological   Endocrine   Heme/Lymph   Allergic/Immunologic    Explanation:     MEDICATIONS:    Current Facility-Administered Medications:   ???  nicotine polacrilex (NICORETTE) gum 4 mg, 4 mg, Oral, PRN, Gillermina Hu, MD, 4 mg at 05/16/16 1616  ???  albuterol sulfate HFA 108 (90 Base) MCG/ACT inhaler 2 puff, 2 puff,  Inhalation, Q4H PRN, Desma Paganini, MD  ???  aspirin chewable tablet 81 mg, 81 mg, Oral, Daily, Desma Paganini, MD, 81 mg at 05/16/16 0819  ???  atorvastatin (LIPITOR) tablet 80 mg, 80 mg, Oral, Nightly, Desma Paganini, MD, 80 mg at 05/15/16 2132  ???  mometasone-formoterol (DULERA) 200-5 MCG/ACT inhaler 2 puff, 2 puff, Inhalation, BID, Desma Paganini, MD, 2 puff at 05/15/16 2004  ???  folic acid (FOLVITE) tablet 1 mg, 1 mg, Oral, Daily, Desma Paganini, MD, 1 mg at 05/16/16 1610  ???  ibuprofen (ADVIL;MOTRIN) tablet 400 mg, 400 mg, Oral, Q8H PRN, Desma Paganini, MD, 400 mg at 05/16/16 0818  ???  melatonin tablet 5 mg, 5 mg, Oral, Nightly, Desma Paganini, MD, 5 mg at 05/15/16 2131  ???  metoprolol tartrate (LOPRESSOR) tablet 25 mg, 25 mg, Oral, BID, Desma Paganini, MD, 25 mg at 05/16/16 9604  ???  nitroGLYCERIN (NITROSTAT) SL tablet 0.4 mg, 0.4 mg, Sublingual, Q5 Min PRN, Desma Paganini, MD  ???  OLANZapine (ZYPREXA) tablet 15 mg, 15 mg, Oral, Nightly, Desma Paganini, MD, 15 mg at 05/15/16 2132  ???  pantoprazole (PROTONIX) tablet 40 mg, 40 mg, Oral, QAM AC, Desma Paganini, MD, 40 mg at 05/16/16 5409  ???  OXcarbazepine (TRILEPTAL) tablet 150 mg, 150 mg, Oral, BID, Desma Paganini, MD, 150 mg at 05/16/16 8119  ???  vitamin B-6 (PYRIDOXINE) tablet 100 mg, 100 mg, Oral, Daily, Desma Paganini, MD, 100 mg at 05/16/16 0819  ???  tiZANidine (ZANAFLEX) tablet 4 mg, 4 mg, Oral, TID, Desma Paganini, MD, 4 mg at 05/16/16 1358  ???  gabapentin (NEURONTIN) capsule 800 mg, 800 mg, Oral, Nightly, Desma Paganini, MD, 800 mg at 05/15/16 2132  ???  hydrOXYzine (VISTARIL) capsule 50 mg, 50 mg, Oral, Q6H PRN, 50 mg at 05/15/16 2132 **OR** hydrOXYzine (VISTARIL) injection 50 mg, 50 mg, Intramuscular, Q6H PRN, Desma Paganini, MD  ???  haloperidol (HALDOL) tablet 5 mg, 5 mg, Oral, Q6H PRN **OR** haloperidol lactate (HALDOL) injection 5 mg, 5 mg, Intramuscular, Q6H PRN, Desma Paganini, MD  ???  vitamin B-1 (THIAMINE) tablet 100 mg, 100 mg, Oral, Daily, Desma Paganini, MD,  100 mg at 05/16/16 1478  ???  multivitamin 1 tablet, 1 tablet, Oral, Daily, Desma Paganini, MD, 1 tablet  at 05/16/16 1610  ???  chlordiazePOXIDE (LIBRIUM) capsule 10 mg, 10 mg, Oral, Q4H PRN, Desma Paganini, MD  ???  chlordiazePOXIDE (LIBRIUM) capsule 25 mg, 25 mg, Oral, Q4H PRN, Desma Paganini, MD  ???  chlordiazePOXIDE (LIBRIUM) capsule 50 mg, 50 mg, Oral, Q2H PRN, Desma Paganini, MD  ???  chlordiazePOXIDE (LIBRIUM) capsule 75 mg, 75 mg, Oral, Q1H PRN, Desma Paganini, MD  ???  LORazepam (ATIVAN) tablet 4 mg, 4 mg, Oral, Q1H PRN, Desma Paganini, MD  ???  acetaminophen (TYLENOL) tablet 650 mg, 650 mg, Oral, Q4H PRN, Desma Paganini, MD  ???  benztropine mesylate (COGENTIN) injection 2 mg, 2 mg, Intramuscular, BID PRN, Desma Paganini, MD  ???  aluminum & magnesium hydroxide-simethicone (MAALOX) 200-200-20 MG/5ML suspension 15 mL, 15 mL, Oral, PRN, Desma Paganini, MD    PSYCHOTHERAPY/COUNSELING:  [x]  Therapeutic interview  [x]  Supportive  []  CBT  []  Ongoing  []  Other    ASSESSMENT:  Patient is:  [x]  Well controlled  [x]  Improving  []  Worsening  []  Other    [x]  Patient continues to need, on a daily basis, active treatment furnished directly by or     requiring the supervision of inpatient psychiatric personnel     Diagnosis:  Principal Problem:    Bipolar 1 disorder, depressed, severe (HCC)  Active Problems:    Cocaine abuse  Resolved Problems:    * No resolved hospital problems. *      Treatment Plan:  [x]  Continue Current Medications  [x]  Continue Follow-up  [x]  Continue Labs      [x]  Risks, benefits, side effects, drug-to-drug interactions and alternatives to treatment were discussed in my usual manner.    Reason for more than one antipsychotic:  [x]  N/A  []  3 failed monotherapy(drugs tried):  []  Cross over to a new antipsychotic  []  Taper to monotherapy from polypharmacy  []  Augmentation of Clozapine therapy due to treatment resistance to single therapy      Electronically signed by Desma Paganini, MD on 05/16/2016 at 7:03 PM

## 2016-05-16 NOTE — Progress Notes (Signed)
Pt. presents irritable and annoyed. Reports having "a lot of pain and nobody wants to do anything about it." Disheveled appearance. Familiar with unit program and staff from past admissions. Feels paranoid and irritated. Reports intense back and leg pain and uses a cane and walker at home. Reports drinking ETOH and using cocaine. Reports suicidal thoughts with a plan to OD. Poor appetite and poor sleep habits. Low motivation and poor concentration and failing memory.  Stressors include  1.people  2.my pain  3.being here  *unable to state any constructive leisure activities or hobbies at this time  Electronically signed by Mliss Sax, CTRS on 05/16/2016 at 6:28 AM

## 2016-05-16 NOTE — Progress Notes (Signed)
Pt noted laying on the chair in group room, watching tv, stated she is doing ok, at 2pm med pass for Zanaflex. Pt is short with answers, stated she is doing ok, no tremors noted, pt ate good lunch, no problems noted.

## 2016-05-16 NOTE — Progress Notes (Signed)
Pt resting in bed sleeping on and off this morning. Pt visible on unit during meals. Pt states she feels depressed and anxious at times. Pt verbalizes she has a passive death wish. Pt denies SI/HI or AV H/D. Pt states she is sleeping well. Pt reports a decreased appetite that is slowly improving.

## 2016-05-16 NOTE — Progress Notes (Signed)
Pt. declined to attend the 0900 community meeting, despite staff encouragement. Electronically signed by Mliss Sax, CTRS on 05/16/2016 at 9:35 AM

## 2016-05-16 NOTE — Progress Notes (Signed)
Pt out on unit watching TV in group room with peer. Pt rating anxiety a 5/10 states ''You guys are just going to send me back to that craziness I came from , last time I left I was not able to get my medications because [facility] did not follow through''the patient does have a passive death wish and contracts for safety while here. Denies S/I H/I and AV hallucinations at this time.

## 2016-05-16 NOTE — Plan of Care (Signed)
Problem: Altered Mood, Psychotic Behavior:  Goal: Able to demonstrate trust by eating, participating in treatment and following staff's direction  Able to demonstrate trust by eating, participating in treatment and following staff's direction   Outcome: Met This Shift    Goal: Able to verbalize decrease in frequency and intensity of hallucinations  Able to verbalize decrease in frequency and intensity of hallucinations   Outcome: Met This Shift    Goal: Able to verbalize reality based thinking  Able to verbalize reality based thinking   Outcome: Met This Shift    Goal: Absence of self-harm  Absence of self-harm   Outcome: Met This Shift    Goal: Ability to interact with others will improve  Ability to interact with others will improve   Outcome: Met This Shift    Goal: Compliance with prescribed medication regimen will improve  Compliance with prescribed medication regimen will improve   Outcome: Met This Shift      Problem: Substance Abuse:  Goal: Absence of drug withdrawal signs and symptoms  Absence of drug withdrawal signs and symptoms   Outcome: Met This Shift    Goal: Participates in care planning  Participates in care planning   Outcome: Met This Shift

## 2016-05-16 NOTE — Progress Notes (Signed)
K.Savage NP on unit. Coumadin not re-started at this time;  pt to f/u outpatient.

## 2016-05-17 MED ORDER — QUETIAPINE FUMARATE 100 MG PO TABS
100 MG | Freq: Every evening | ORAL | Status: DC
Start: 2016-05-17 — End: 2016-05-20
  Administered 2016-05-18 – 2016-05-20 (×3): 100 mg via ORAL

## 2016-05-17 MED ORDER — TOPIRAMATE 25 MG PO TABS
25 MG | Freq: Two times a day (BID) | ORAL | Status: DC
Start: 2016-05-17 — End: 2016-05-20
  Administered 2016-05-17 – 2016-05-20 (×7): 25 mg via ORAL

## 2016-05-17 MED ORDER — QUETIAPINE FUMARATE 25 MG PO TABS
25 MG | Freq: Two times a day (BID) | ORAL | Status: DC
Start: 2016-05-17 — End: 2016-05-20
  Administered 2016-05-17 – 2016-05-20 (×6): 25 mg via ORAL

## 2016-05-17 MED ORDER — OLANZAPINE 10 MG PO TABS
10 MG | Freq: Every evening | ORAL | Status: DC
Start: 2016-05-17 — End: 2016-05-20
  Administered 2016-05-18 – 2016-05-20 (×3): 10 mg via ORAL

## 2016-05-17 MED FILL — GNP VITAMIN B-6 100 MG PO TABS: 100 MG | ORAL | Qty: 1

## 2016-05-17 MED FILL — NICORELIEF 4 MG MT GUM: 4 MG | OROMUCOSAL | Qty: 1

## 2016-05-17 MED FILL — THEREMS PO TABS: ORAL | Qty: 1

## 2016-05-17 MED FILL — QUETIAPINE FUMARATE 25 MG PO TABS: 25 MG | ORAL | Qty: 1

## 2016-05-17 MED FILL — CHILDRENS ASPIRIN 81 MG PO CHEW: 81 MG | ORAL | Qty: 1

## 2016-05-17 MED FILL — TOPIRAMATE 25 MG PO TABS: 25 MG | ORAL | Qty: 1

## 2016-05-17 MED FILL — IBUPROFEN 400 MG PO TABS: 400 MG | ORAL | Qty: 1

## 2016-05-17 MED FILL — METOPROLOL TARTRATE 25 MG PO TABS: 25 MG | ORAL | Qty: 1

## 2016-05-17 MED FILL — ACETAMINOPHEN 325 MG PO TABS: 325 MG | ORAL | Qty: 2

## 2016-05-17 MED FILL — OXCARBAZEPINE 150 MG PO TABS: 150 MG | ORAL | Qty: 1

## 2016-05-17 MED FILL — PANTOPRAZOLE SODIUM 40 MG PO TBEC: 40 MG | ORAL | Qty: 1

## 2016-05-17 MED FILL — TIZANIDINE HCL 4 MG PO TABS: 4 MG | ORAL | Qty: 1

## 2016-05-17 MED FILL — B-1 100 MG PO TABS: 100 MG | ORAL | Qty: 1

## 2016-05-17 MED FILL — HYDROXYZINE PAMOATE 50 MG PO CAPS: 50 MG | ORAL | Qty: 1

## 2016-05-17 MED FILL — FOLIC ACID 1 MG PO TABS: 1 MG | ORAL | Qty: 1

## 2016-05-17 NOTE — Progress Notes (Signed)
Patient attended AA meeting this afternoon.  Electronically signed by Adam Phenix, RN on 05/17/2016 at 4:07 PM

## 2016-05-17 NOTE — Care Coordination-Inpatient (Signed)
Pt. Calm and cooperative.  Denies all.  Wants Topamax and Seroquel and less Zyprexa because pt. Feels it's making her gain weight.  Reports racing thought, "but that's normal for me."  Mild depression and high anxiety.  Out on unit and social with peers.  Laughing and joking.  Flat affect during assessment.

## 2016-05-17 NOTE — Care Coordination-Inpatient (Signed)
[facility]HI Biopsychosocial Assessment    Current Level of Psychosocial Functioning     Independent   Dependent    Minimal Assist x    Comments:  Patient lives on her own and receives government assistance to help with living expenses, medical insurance, and food.    Psychosocial High Risk Factors (check all that apply)    Unable to obtain meds x  Chronic illness/pain  x  Substance abuse x  Lack of Family Support   Financial stress   Isolation   Inadequate Community Resources  Suicide attempt(s)  Not taking medications   Victim of crime   Developmental Delay  Unable to manage personal needs    Age 41 or older   Homeless  No transportation   Readmission within 30 days  Unemployment  Traumatic Event    Comments: Patient has three high risk factors that impact her mental stability.    Psychiatric Advanced Directives: None Reported.    Family to Involve in Treatment: Patient provided her daughter's information for collateral contact.    Sexual Orientation:  Patient is in neither a hetero or homosexual relationship.    Patient Strengths: Patient is very vocal about getting her psychiatric needs met.    Patient Barriers: Patient believes he medication is not being prescribed properly so patient self-medicates with drugs/alcohol.    Opiate Education Provided:  Patient participates in brief education/intervention as indicated by her SBIRT scores.    CMHC/mental health history: Patient has a long history with mental health interventions inpatient and outpatient.    Plan of Care   medication management, group/individual therapies, family meetings, psycho -education, treatment team meetings to assist with stabilization    Initial Discharge Plan:  Patient will return to her home and continue care at the [facility].      Clinical Summary:    Patient is a 51 years old female who was admitted to the [facility] due to depression and suicidal thoughts with a plan to overdose. Patient was somewhat agitated during the interview. Patient blames the  mental health system for her medication noncompliance. The result of patient's noncompliance is rapid decompensation. When symptomatic patient makes the decision to use drugs and alcohol versus reaching out to her doctor at [facility]. Patient also complained that the doctors at [facility] change quite often. To date, patient does not know the name of her doctor. Patient states she just wants her medication corrected and she will not return to the ER/BHI for acute psychiatric care.    Malachy Mood LISW-S

## 2016-05-17 NOTE — Care Coordination-Inpatient (Signed)
Brief Intervention and Referral to Treatment Summary    Patient was provided PHQ-9, AUDIT and DAST Screening:      PHQ-9 Score: 27  AUDIT Score:  23  DAST Score:  3    Patient???s substance use is considered    Low Risk/Healthy  Moderate Risk  Harmful  Dependent x    Patient???s depression is considered:    Minimal  Mild   Moderate  Moderately Severe  Severe x    Brief Education Was Provided    Patient was receptive x  Patient was not receptive      Brief Intervention Is Provided (Only for AUDIT or DAST)    Patient reports readiness to decrease and/or stop use and a plan was discussed x  Patient denies readiness to decrease and/or stop use and a plan was not discussed      Recommendations/Referrals for Brief and/or Specialized Treatment Provided to Patient  Patient was forthcoming about her drug/alcohol issues. Patient stated she had 30 years of sobriety up until about 2 years ago. Patient made the claim that she has been self-medicating due to a recent change in providers at [facility]. Patient believes her mental health medications are not being prescribed correctly. As a result, patient chooses to abuse alcohol/drugs.    Lucretia Field LISW-S

## 2016-05-17 NOTE — Care Coordination-Inpatient (Signed)
FAMILY COLLATERAL NOTE    Family/Support Name: Gilberto Better  Contact #: [phone]  Relationship to Pt: daughter      Placed call to above.      Response:  Child psychotherapist called number provided by patient. A voicemail message was left requesting a return phone call.         Lucretia Field, LISW

## 2016-05-17 NOTE — Progress Notes (Signed)
Pt noted in group now, appears focussed, started Topamax , pt stated its a home med. Noted Seroquel for anxiety to be started pt informed. Not tremors noted.

## 2016-05-17 NOTE — Progress Notes (Addendum)
Pt visible out on unit social and laughing with peers, pt did attend AA meeting today and pt has been attending groups.pt reports depression with high anxiety and states appetite and sleep are better. Denies S/I H/I and AV hallucinations.

## 2016-05-17 NOTE — Progress Notes (Signed)
Pt. attended the 0900 community meeting. Electronically signed by Mliss Sax, CTRS on 05/17/2016 at 9:22 AM

## 2016-05-17 NOTE — Progress Notes (Signed)
BEHAVIORAL HEALTH FOLLOW-UP NOTE I    05/17/2016     Patient was seen and examined in person   Chart reviewed   Labs reviewed   Patient's case discussed with staff/team    Chief Complaint:   Depression, visual hallucinations, suicidal ideations    Interim History:     Patient said she was still feeling very anxious. She said she had felt better in the past with a combination of Topamax, and Seroquel, and wanted to go back on it. She denied having any hallucinations. She said her depression was improving, but still had some suicidal ideations; however she does not have a plan now. She said her sleep was 'so-so', and her appetite was also "so-so".     Appetite:   Normal/Unchanged   Increased   Decreased  SI  Present   Absent    Sleep:        Normal/Unchanged   Fair        Poor          HI  Present   Absent    Energy:     Normal/Unchanged   Increased   Decreased   Plan  Present                           Absent   N/A    Patient is  able   unable to CONTRACT FOR SAFETY   Aggression:   yes   no  Medication side effects(SE):   None(Psych. Meds.)  Other  Well tolerated:  yes   no    Examination:  BP (!) 140/87    Pulse 86    Temp 98.4 ??F (36.9 ??C)    Resp 18    Ht  (1.626 m)    Wt 187 lb (84.8 kg)    SpO2 100%    BMI 32.10 kg/m??     Appearance:  casual   anxious   confused   blunted   silly   poor hygiene   poor grooming  Gait:   stable   unstable   limping   shuffling   in wheel chair or other support    Mental Status:   Orientation: Date/Time:  yes   no Place:  yes   no     Person:  yes   no  Level of Consciousness:  alert   drowsy   tired   lethargic   distractable   asleep   could not be assessed    Manner:    Cooperative   Guarded   Suspicious   Irritable   Hostile   Withdrawn   Other    Motor Activity:    Normal   Agitation   Motor retardation   Tremor    Other    Musculoskeletal:    Normal   Rigidity   Cogwheel   Flaccid   Tics/TD   Other    Speech:    Normal   Soft   Slow   Dysarthria   Incoherent   Other    Language:   Normal   Expressive Aphasia   Fluent Aphasia   Other    Mood:   Euthymic   Depressed   Irritable   Angry   Anxious   Fearful   Apathetic   Euphoric   Other    Affect:   Euthymic   Depressed   Blunted   Flat   Irritable   Angry    Anxious     Labile   Expansive   Exaggerated   Other    Thought Process/Association:    Normal   Tangential   Circumstantial   Poverty of Thought   Concrete   Disorganized   Racing Thoughts   Flight of Ideas   Loose   Other    Thought Contents:    Hopelessness   Worthlessness   Hypochondriasis   Delusions   Paranoia   Ruminations   Obsessions/Compulsions   Confused  Hopeful    Future Oriented  Other    Perception:   Normal   Hallucinations   Auditory   Visual   Olfactory   Tactile     Dissociation   Flashbacks   Other    Attention/Concentration:   Intact   Poor   Distractible   Other    Cognition:   Intact   Impaired   Insight:  Intact   Fair   Limited   Short Term Memory:   Intact   Impaired   Poor  Judgement:   Intact   Fair   Limited  Remote Memory:   Intact   Impaired   Poor        BEHAVIORAL HEALTH FOLLOW-UP NOTE II    05/17/2016    PAST MEDICAL/PSYCHIATRIC HISTORY:   Past Medical History:   Diagnosis Date   ??? Anxiety    ??? CAD (coronary artery disease)    ??? Cardiac angina (HCC)    ??? CHF (congestive heart failure) (HCC)    ??? COPD (chronic obstructive pulmonary disease) (HCC)    ??? 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 GERD (gastroesophageal reflux disease)    ??? History of blood transfusion    ??? Hyperlipidemia    ??? Hypertension    ??? MI (myocardial infarction)    ??? Neuromuscular disorder (HCC)    ??? Osteoarthritis    ??? Pneumonia    ???  Psychiatric problem    ??? Pulmonary embolism (HCC)    ??? Scoliosis        FAMILY/SOCIAL HISTORY:  Family History   Problem Relation Age of Onset   ??? Cirrhosis Mother    ??? Other Mother      Hepatitis B   ??? High Blood Pressure Father    ??? Cancer Maternal Grandmother    ??? Arthritis Maternal Grandmother    ??? Cancer Paternal Grandmother    ??? Scoliosis Paternal Grandmother    ??? High Blood Pressure Paternal Grandfather      Social History     Social History   ??? Marital status: Single     Spouse name: N/A   ??? Number of children: N/A   ??? Years of education: N/A     Occupational History   ??? Not on file.     Social History Main Topics   ??? Smoking status: Current Every Day Smoker     Packs/day: 0.50     Years: 38.00     Types: Cigarettes   ??? Smokeless tobacco: Never Used   ??? Alcohol use Yes      Comment: recentyl mdrinking daily    ??? Drug use: Yes     Types: Cocaine      Comment: last used two days ago   ??? Sexual activity: Not Currently     Other Topics Concern   ??? Not on file     Social History Narrative   ??? No narrative on file  LABS:  Lab Results   Component Value Date    LITHIUM 0.2 (L) 08/04/2015    NA 140 05/14/2016    BUN 12 05/14/2016    CREATININE 0.93 (H) 05/14/2016    TSH 1.290 05/14/2016    WBC 6.7 05/14/2016     No results found for: PHENYTOIN, PHENOBARB, VALPROATE, CBMZ  Lab Results   Component Value Date    INR 3.3 03/31/2016    PROTIME 34.8 (H) 03/31/2016     Lab Results   Component Value Date    APTT 39.3 (H) 03/31/2016     Recent Labs      05/14/16   2202   ETOH  <10           ROS:  [x]  All negative/unchanged except if checked. Explain positive(checked items) below:  []  Constitutional  []  Eyes  []  Ear/Nose/Mouth/Throat  []  Respiratory  []  CV  []  GI  []  GU  []  Musculoskeletal  []  Skin/Breast  []  Neurological  []  Endocrine  []  Heme/Lymph  []  Allergic/Immunologic    Explanation:     MEDICATIONS:    Current Facility-Administered Medications:   ???  QUEtiapine (SEROQUEL) tablet 25 mg, 25 mg, Oral, BID- 8&2, Desma Paganini, MD, 25 mg at 05/17/16 1436  ???  QUEtiapine (SEROQUEL) tablet 100 mg, 100 mg, Oral, Nightly, Desma Paganini, MD  ???  topiramate (TOPAMAX) tablet 25 mg, 25 mg, Oral, BID, Desma Paganini, MD, 25 mg at 05/17/16 1049  ???  OLANZapine (ZYPREXA) tablet 10 mg, 10 mg, Oral, Nightly, Desma Paganini, MD  ???  nicotine polacrilex (NICORETTE) gum 4 mg, 4 mg, Oral, PRN, Gillermina Hu, MD, 4 mg at 05/17/16 1451  ???  albuterol sulfate HFA 108 (90 Base) MCG/ACT inhaler 2 puff, 2 puff, Inhalation, Q4H PRN, Desma Paganini, MD  ???  aspirin chewable tablet 81 mg, 81 mg, Oral, Daily, Desma Paganini, MD, 81 mg at 05/17/16 1610  ???  atorvastatin (LIPITOR) tablet 80 mg, 80 mg, Oral, Nightly, Desma Paganini, MD, 80 mg at 05/16/16 2111  ???  mometasone-formoterol (DULERA) 200-5 MCG/ACT inhaler 2 puff, 2 puff, Inhalation, BID, Desma Paganini, MD, 2 puff at 05/15/16 2004  ???  folic acid (FOLVITE) tablet 1 mg, 1 mg, Oral, Daily, Desma Paganini, MD, 1 mg at 05/17/16 9604  ???  ibuprofen (ADVIL;MOTRIN) tablet 400 mg, 400 mg, Oral, Q8H PRN, Desma Paganini, MD, 400 mg at 05/17/16 5409  ???  melatonin tablet 5 mg, 5 mg, Oral, Nightly, Desma Paganini, MD, 5 mg at 05/16/16 2110  ???  metoprolol tartrate (LOPRESSOR) tablet 25 mg, 25 mg, Oral, BID, Desma Paganini, MD, 25 mg at 05/17/16 0900  ???  nitroGLYCERIN (NITROSTAT) SL tablet 0.4 mg, 0.4 mg, Sublingual, Q5 Min PRN, Desma Paganini, MD  ???  pantoprazole (PROTONIX) tablet 40 mg, 40 mg, Oral, QAM AC, Desma Paganini, MD, 40 mg at 05/17/16 0602  ???  OXcarbazepine (TRILEPTAL) tablet 150 mg, 150 mg, Oral, BID, Desma Paganini, MD, 150 mg at 05/17/16 8119  ???  vitamin B-6 (PYRIDOXINE) tablet 100 mg, 100 mg, Oral, Daily, Desma Paganini, MD, 100 mg at 05/17/16 1478  ???  tiZANidine (ZANAFLEX) tablet 4 mg, 4 mg, Oral, TID, Desma Paganini, MD, 4 mg at 05/17/16 1436  ???  gabapentin (NEURONTIN) capsule 800 mg, 800 mg, Oral, Nightly, Desma Paganini, MD, 800 mg at 05/16/16 2114  ???  hydrOXYzine (VISTARIL) capsule 50 mg, 50 mg,  Oral, Q6H PRN, 50 mg at 05/17/16 0857 **OR** hydrOXYzine (VISTARIL) injection 50 mg, 50 mg,  Intramuscular, Q6H PRN, Desma Paganini, MD  ???  haloperidol (HALDOL) tablet 5 mg, 5 mg, Oral, Q6H PRN **OR** haloperidol lactate (HALDOL) injection 5 mg, 5 mg, Intramuscular, Q6H PRN, Desma Paganini, MD  ???  vitamin B-1 (THIAMINE) tablet 100 mg, 100 mg, Oral, Daily, Desma Paganini, MD, 100 mg at 05/17/16 1610  ???  multivitamin 1 tablet, 1 tablet, Oral, Daily, Desma Paganini, MD, 1 tablet at 05/17/16 [phone]  ???  chlordiazePOXIDE (LIBRIUM) capsule 10 mg, 10 mg, Oral, Q4H PRN, Desma Paganini, MD  ???  chlordiazePOXIDE (LIBRIUM) capsule 25 mg, 25 mg, Oral, Q4H PRN, Desma Paganini, MD  ???  chlordiazePOXIDE (LIBRIUM) capsule 50 mg, 50 mg, Oral, Q2H PRN, Desma Paganini, MD  ???  chlordiazePOXIDE (LIBRIUM) capsule 75 mg, 75 mg, Oral, Q1H PRN, Desma Paganini, MD  ???  LORazepam (ATIVAN) tablet 4 mg, 4 mg, Oral, Q1H PRN, Desma Paganini, MD  ???  acetaminophen (TYLENOL) tablet 650 mg, 650 mg, Oral, Q4H PRN, Desma Paganini, MD, 650 mg at 05/16/16 2111  ???  benztropine mesylate (COGENTIN) injection 2 mg, 2 mg, Intramuscular, BID PRN, Desma Paganini, MD  ???  aluminum & magnesium hydroxide-simethicone (MAALOX) 200-200-20 MG/5ML suspension 15 mL, 15 mL, Oral, PRN, Desma Paganini, MD    PSYCHOTHERAPY/COUNSELING:   Therapeutic interview   Supportive   CBT   Ongoing   Other    ASSESSMENT:  Patient is:   Well controlled   Improving   Worsening   Other     Patient continues to need, on a daily basis, active treatment furnished directly by or     requiring the supervision of inpatient psychiatric personnel     Diagnosis:  Principal Problem:    Bipolar 1 disorder, depressed, severe (HCC)  Active Problems:    Cocaine abuse  Resolved Problems:    * No resolved hospital problems. *      Treatment Plan:   Continue Current Medications, but decrease Zyprexa and start Seroquel and Topamax   Continue Follow-up   Continue Labs        Risks, benefits, side effects, drug-to-drug interactions and alternatives to treatment were discussed in my usual manner.    Reason for more than one antipsychotic:   N/A   3 failed monotherapy(drugs tried):   Cross over to a new antipsychotic   Taper to monotherapy from polypharmacy   Augmentation of Clozapine therapy due to treatment resistance to single therapy      Electronically signed by Desma Paganini, MD on 05/17/2016 at 5:16 PM

## 2016-05-17 NOTE — Progress Notes (Addendum)
Group Therapy Note    Date: 05/17/2016  Start Time: 1000  End Time:  1100  Number of Participants: 13    Type of Group: Psychoeducation    Wellness Binder Information  Module Name:    Session Number:      Patient's Goal:  "to feel better"    Notes:  Pt. attended the skill group. Pt. was initially rushing to complete project. Accepted redirection and continued to work diligently.    Status After Intervention:  Improved    Participation Level: Active Listener and Interactive    Participation Quality: Appropriate, Attentive and Sharing      Speech:  normal      Thought Process/Content: Logical      Affective Functioning: Flat      Mood: calm      Level of consciousness:  Alert, Oriented x4 and Attentive      Response to Learning: Able to change behavior and Progressing to goal      Endings: None Reported    Modes of Intervention: Education, Support, Socialization and Activity      Discipline Responsible: Psychoeducational Specialist      Signature:  Mliss Sax, [employer]

## 2016-05-17 NOTE — Behavioral Health Treatment Team (Signed)
Date: 05/17/2016  Start Time: 2015  End Time:  2045  Number of Participants: 14  ??  Type of Group: Wrap up group.   ??  Pt attended group, did not participate.  Electronically signed by Malachi Pro, LPN on 04/18/4399 at 10:15 PM

## 2016-05-18 LAB — PROTIME-INR
INR: 1.1
Protime: 11.5 s (ref 8.1–13.7)

## 2016-05-18 MED ORDER — WARFARIN SODIUM 2.5 MG PO TABS
2.5 MG | Freq: Every day | ORAL | Status: DC
Start: 2016-05-18 — End: 2016-05-19
  Administered 2016-05-18: 22:00:00 5 mg via ORAL

## 2016-05-18 MED ORDER — ENOXAPARIN SODIUM 80 MG/0.8ML SC SOLN
80 MG/0.8ML | Freq: Two times a day (BID) | SUBCUTANEOUS | Status: DC
Start: 2016-05-18 — End: 2016-05-20
  Administered 2016-05-18 – 2016-05-20 (×5): 80 mg/kg via SUBCUTANEOUS

## 2016-05-18 MED ORDER — FLUTICASONE PROPIONATE 50 MCG/ACT NA SUSP
50 MCG/ACT | Freq: Every day | NASAL | Status: DC
Start: 2016-05-18 — End: 2016-05-20

## 2016-05-18 MED FILL — GNP VITAMIN B-6 100 MG PO TABS: 100 MG | ORAL | Qty: 1

## 2016-05-18 MED FILL — LOVENOX 80 MG/0.8ML SC SOLN: 80 MG/0.8ML | SUBCUTANEOUS | Qty: 0.8

## 2016-05-18 MED FILL — ATORVASTATIN CALCIUM 80 MG PO TABS: 80 MG | ORAL | Qty: 1

## 2016-05-18 MED FILL — GABAPENTIN 400 MG PO CAPS: 400 MG | ORAL | Qty: 2

## 2016-05-18 MED FILL — OLANZAPINE 10 MG PO TABS: 10 MG | ORAL | Qty: 1

## 2016-05-18 MED FILL — NICORELIEF 4 MG MT GUM: 4 MG | OROMUCOSAL | Qty: 1

## 2016-05-18 MED FILL — FLUTICASONE PROPIONATE 50 MCG/ACT NA SUSP: 50 MCG/ACT | NASAL | Qty: 16

## 2016-05-18 MED FILL — CHILDRENS ASPIRIN 81 MG PO CHEW: 81 MG | ORAL | Qty: 1

## 2016-05-18 MED FILL — HYDROXYZINE PAMOATE 50 MG PO CAPS: 50 MG | ORAL | Qty: 1

## 2016-05-18 MED FILL — QUETIAPINE FUMARATE 25 MG PO TABS: 25 MG | ORAL | Qty: 1

## 2016-05-18 MED FILL — METOPROLOL TARTRATE 25 MG PO TABS: 25 MG | ORAL | Qty: 1

## 2016-05-18 MED FILL — MELATONIN 1 MG PO TABS: 1 MG | ORAL | Qty: 2

## 2016-05-18 MED FILL — TOPIRAMATE 25 MG PO TABS: 25 MG | ORAL | Qty: 1

## 2016-05-18 MED FILL — QUETIAPINE FUMARATE 100 MG PO TABS: 100 MG | ORAL | Qty: 1

## 2016-05-18 MED FILL — PANTOPRAZOLE SODIUM 40 MG PO TBEC: 40 MG | ORAL | Qty: 1

## 2016-05-18 MED FILL — FOLIC ACID 1 MG PO TABS: 1 MG | ORAL | Qty: 1

## 2016-05-18 MED FILL — TIZANIDINE HCL 4 MG PO TABS: 4 MG | ORAL | Qty: 1

## 2016-05-18 MED FILL — OXCARBAZEPINE 150 MG PO TABS: 150 MG | ORAL | Qty: 1

## 2016-05-18 MED FILL — IBUPROFEN 400 MG PO TABS: 400 MG | ORAL | Qty: 1

## 2016-05-18 MED FILL — THEREMS PO TABS: ORAL | Qty: 1

## 2016-05-18 MED FILL — COUMADIN 2.5 MG PO TABS: 2.5 MG | ORAL | Qty: 2

## 2016-05-18 MED FILL — B-1 100 MG PO TABS: 100 MG | ORAL | Qty: 1

## 2016-05-18 NOTE — Plan of Care (Signed)
Problem: Altered Mood, Psychotic Behavior:  Goal: Able to demonstrate trust by eating, participating in treatment and following staff's direction  Able to demonstrate trust by eating, participating in treatment and following staff's direction   Outcome: Completed Date Met: 05/18/16    Goal: Able to verbalize decrease in frequency and intensity of hallucinations  Able to verbalize decrease in frequency and intensity of hallucinations   Outcome: Ongoing    Goal: Able to verbalize reality based thinking  Able to verbalize reality based thinking   Outcome: Ongoing    Goal: Absence of self-harm  Absence of self-harm   Outcome: Met This Shift    Goal: Ability to interact with others will improve  Ability to interact with others will improve   Outcome: Met This Shift    Goal: Compliance with prescribed medication regimen will improve  Compliance with prescribed medication regimen will improve   Outcome: Met This Shift      Problem: Substance Abuse:  Goal: Absence of drug withdrawal signs and symptoms  Absence of drug withdrawal signs and symptoms   Outcome: Met This Shift    Goal: Participates in care planning  Participates in care planning   Outcome: Met This Shift

## 2016-05-18 NOTE — Progress Notes (Signed)
Pt. attended the 0900 community meeting.Electronically signed by Tressie Ellis, RAC on 05/18/2016 at 2:09 PM

## 2016-05-18 NOTE — Progress Notes (Signed)
Assessment complete.  Pt out on unit socializing with other peers.  Laughing.  Pt cooperative.  Denies SI/HI/AVH.  Feeling better today.  No current needs at this time.  Electronically signed by Consuello Closs. Excell Seltzer, RN on 05/18/2016 at 4:11 PM

## 2016-05-18 NOTE — Care Coordination-Inpatient (Signed)
FAMILY COLLATERAL NOTE    Family/Support Name: Era Bumpers #: [phone]  Relationship to Pt:: daughter        Family/Support contact aware of hospitalization: yes  Presenting Symptoms/Current Concerns: patient was very upset and sad and as a result of not feeling like she could manage wanted to go to the hosptial.       Top 3 Life Stressors: her son (got into a car accident awhile ago and is not the same), her life in general that she feels that she cant function like she used too, lonliness.         Background History Relevant to Current Hospitalization:daughter reports that patient was very depressed and very sad and has been doing through a lot of things with her children and has been very upset. Daughter reports that patient fleetingly said she thought about hurting herself. Daughter reports that she feels useless and under appreciated. Son is also incarcerated right now. Unsure if she if she able to return back home or not. Daughter reports mother is unsure if she is able to return home, not necessarily because of eviction but just level of functions.  Daughter is her home health aid, but not qualified to look after her meds just her adls. Daughter said she is on a lot of medications and is unsure if patient takes them properly.         Family Mental Health/Substance Use History: unknown        Support Network's Goal for Hospitalization: yes     Discharge Plan: unsure if able to return home, daughter said she could but more so if there is enough help with her, however daughter said she is her home health aid, and somewhat unclear why daughter hesitated.       Support Network Supportive of Discharge Plan: yes      Support can confirm Energy manager of [employer]: none      Support agreeable to [employer] and Monitor Medications (including Prescription and OTC): usually looks after her meds on own  and may not take them as prescribed.     Identified Barriers to Compliance with Discharge Plan: unsure at this time.     Recommendations for Support Network:   Call [facility] or [employer].     Electronically signed by Noelle Penner, MSW, LSW on 05/18/2016 at 10:07 AM        Noelle Penner, MSW, LSW

## 2016-05-18 NOTE — Progress Notes (Signed)
Pt medicated with MOM for constipation

## 2016-05-18 NOTE — Progress Notes (Signed)
Assessment complete.  Pt a&ox3.  Denies SI/HI/AVH.  Pt states she is feeling better.  Still states her anxiety and depression is a 7/10.  Which she states that she feels much better since when she came in.  Pt states the med changes are helping.  No current needs at this time.  Electronically signed by Consuello Closs. Excell Seltzer, RN on 05/18/2016 at 10:31 AM

## 2016-05-18 NOTE — Progress Notes (Signed)
Group Therapy Note    Date: 05/18/2016  Start Time:1430  End Time: 1500    Number of Participants:10    Type of Group: Psychoeducation    Patient's Goal:  To participate in mood management group.    Notes:  Patient participated briefly and left the group prematurely.    Status After Intervention:  Unchanged    Participation Level: Minimal    Participation Quality: Resistant      Speech:  hesitant      Thought Process/Content: Perseverating      Affective Functioning: Incongruent      Mood: depressed      Level of consciousness:  Preoccupied      Response to Learning: Able to verbalize current knowledge/experience      Endings: None Reported    Modes of Intervention: Education      Discipline Responsible: Social Worker/Counselor      Signature:  Lucretia Field, LISW

## 2016-05-18 NOTE — Plan of Care (Signed)
Problem: Substance Abuse:  Goal: Absence of drug withdrawal signs and symptoms  Absence of drug withdrawal signs and symptoms   Outcome: Ongoing    Goal: Participates in care planning  Participates in care planning   Outcome: Ongoing

## 2016-05-18 NOTE — Plan of Care (Signed)
Problem: Altered Mood, Psychotic Behavior:  Goal: Able to verbalize decrease in frequency and intensity of hallucinations  Able to verbalize decrease in frequency and intensity of hallucinations   Outcome: Ongoing    Goal: Able to verbalize reality based thinking  Able to verbalize reality based thinking   Outcome: Ongoing    Goal: Absence of self-harm  Absence of self-harm   Outcome: Ongoing    Goal: Ability to interact with others will improve  Ability to interact with others will improve   Outcome: Ongoing    Goal: Compliance with prescribed medication regimen will improve  Compliance with prescribed medication regimen will improve   Outcome: Ongoing      Problem: Substance Abuse:  Goal: Absence of drug withdrawal signs and symptoms  Absence of drug withdrawal signs and symptoms   Outcome: Ongoing    Goal: Participates in care planning  Participates in care planning   Outcome: Ongoing    Goal: Patient specific goal  Patient specific goal   Outcome: Ongoing

## 2016-05-18 NOTE — Progress Notes (Signed)
Group Therapy Note    Date: 05/18/2016  Start Time: 1000  End Time:  1045  Number of Participants: 10    Type of Group: Psychoeducation    Wellness Binder Information  Module Name:    Session Number:      Patient's Goal:  "To feel better"    Notes:  Patient work fairly on her task in group.    Status After Intervention:  Unchanged    Participation Level: Active Listener    Participation Quality: Appropriate      Speech:  normal      Thought Process/Content: Linear      Affective Functioning: Congruent      Mood: calm      Level of consciousness:  Alert      Response to Learning: Able to verbalize current knowledge/experience      Endings: None Reported    Modes of Intervention: Education, Socialization and Activity      Discipline Responsible: Psychoeducational Specialist      Signature:  Tressie Ellis

## 2016-05-18 NOTE — Care Coordination-Inpatient (Signed)
Group Therapy Note    Date: 05/18/2016  Start Time: 1110  End Time:  1205  Number of Participants: 8    Type of Group: Psychotherapy    Wellness Binder Information  Module Name:  x  Session Number:  x    Patient's Goal:  To get meds back in me.    Notes:  Patient stated that she has a plan after discharge to start going to meetings.    Status After Intervention:  Unchanged    Participation Level: Interactive    Participation Quality: Appropriate      Speech:  normal      Thought Process/Content: Logical      Affective Functioning: Congruent      Mood: good      Level of consciousness:  Alert      Response to Learning: Able to verbalize current knowledge/experience      Endings: None Reported    Modes of Intervention: Support      Discipline Responsible: Social Worker/Counselor   Electronically signed by Melven Sartorius, LPCC on 05/18/2016 at 1:36 PM      Signature:  Melven Sartorius, [facility]

## 2016-05-18 NOTE — Plan of Care (Signed)
Problem: Altered Mood, Psychotic Behavior:  Goal: Able to verbalize decrease in frequency and intensity of hallucinations  Able to verbalize decrease in frequency and intensity of hallucinations   Outcome: Ongoing    Goal: Able to verbalize reality based thinking  Able to verbalize reality based thinking   Outcome: Ongoing    Goal: Absence of self-harm  Absence of self-harm   Outcome: Ongoing    Goal: Ability to interact with others will improve  Ability to interact with others will improve   Outcome: Ongoing    Goal: Compliance with prescribed medication regimen will improve  Compliance with prescribed medication regimen will improve   Outcome: Met This Shift

## 2016-05-18 NOTE — Progress Notes (Signed)
Pt c/o constipation,no prn meds available. New order per tiffany brown pa colace cheduled, and mom prn

## 2016-05-18 NOTE — Behavioral Health Treatment Team (Signed)
Pt attended and participated in Recreation group at 1930.    Electronically signed by Keyonta Madrid on 05/18/2016 at 10:34 PM

## 2016-05-18 NOTE — Progress Notes (Signed)
BEHAVIORAL HEALTH FOLLOW-UP NOTE I    05/18/2016     Patient was seen and examined in person   Chart reviewed   Labs reviewed   Patient's case discussed with staff/team    Chief Complaint:   Depression, visual hallucinations, suicidal ideations    Interim History:     Patient said she feels "a little bit better". She said she had been able to sleep OK, but had some nightmares last night. She said her thoughts are "starting to clear up". She said the depression is getting better, but is still present, as well as the anxiety. She denied having any suicidal or homicidal ideations, and she denied having any hallucinations, paranoia or other delusions.     Appetite:   Normal/Unchanged   Increased   Decreased  SI  Present   Absent    Sleep:        Normal/Unchanged   Fair        Poor          HI  Present   Absent    Energy:     Normal/Unchanged   Increased   Decreased   Plan  Present                           Absent   N/A    Patient is  able   unable to CONTRACT FOR SAFETY   Aggression:   yes   no  Medication side effects(SE):   None(Psych. Meds.)  Other  Well tolerated:  yes   no    Examination:  BP 130/83    Pulse 75    Temp 98 ??F (36.7 ??C) (Oral)    Resp 18    Ht  (1.626 m)    Wt 187 lb (84.8 kg)    SpO2 98%    BMI 32.10 kg/m??     Appearance:  casual   anxious   confused   blunted   silly   poor hygiene   poor grooming  Gait:   stable   unstable   limping   shuffling   in wheel chair or other support    Mental Status:   Orientation: Date/Time:  yes   no Place:  yes   no     Person:  yes   no  Level of Consciousness:  alert   drowsy   tired   lethargic   distractable   asleep   could not be assessed    Manner:    Cooperative   Guarded   Suspicious   Irritable   Hostile   Withdrawn   Other    Motor Activity:    Normal   Agitation   Motor retardation   Tremor    Other    Musculoskeletal:    Normal   Rigidity   Cogwheel   Flaccid   Tics/TD   Other    Speech:    Normal   Soft   Slow   Dysarthria   Incoherent   Other    Language:   Normal   Expressive Aphasia   Fluent Aphasia   Other    Mood:   Euthymic   Depressed   Irritable   Angry   Anxious   Fearful   Apathetic   Euphoric   Other    Affect:   Euthymic   Depressed   Blunted   Flat   Irritable   Angry   Anxious      Labile   Expansive   Exaggerated   Other    Thought Process/Association:    Normal   Tangential   Circumstantial   Poverty of Thought   Concrete   Disorganized   Racing Thoughts   Flight of Ideas   Loose   Other    Thought Contents:    Hopelessness   Worthlessness   Hypochondriasis   Delusions   Paranoia   Ruminations   Obsessions/Compulsions   Confused  Hopeful    Future Oriented  Other    Perception:   Normal   Hallucinations   Auditory   Visual   Olfactory   Tactile     Dissociation   Flashbacks   Other    Attention/Concentration:   Intact   Poor   Distractible   Other    Cognition:   Intact   Impaired   Insight:  Intact   Fair   Limited   Short Term Memory:   Intact   Impaired   Poor  Judgement:   Intact   Fair   Limited  Remote Memory:   Intact   Impaired   Poor        BEHAVIORAL HEALTH FOLLOW-UP NOTE II    05/18/2016    PAST MEDICAL/PSYCHIATRIC HISTORY:   Past Medical History:   Diagnosis Date   ??? Anxiety    ??? CAD (coronary artery disease)    ??? Cardiac angina (HCC)    ??? CHF (congestive heart failure) (HCC)    ??? COPD (chronic obstructive pulmonary disease) (HCC)    ??? 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 GERD (gastroesophageal reflux disease)    ??? History of blood transfusion    ??? Hyperlipidemia    ??? Hypertension    ??? MI (myocardial infarction)    ??? Neuromuscular disorder (HCC)    ??? Osteoarthritis    ??? Pneumonia    ???  Psychiatric problem    ??? Pulmonary embolism (HCC)    ??? Scoliosis        FAMILY/SOCIAL HISTORY:  Family History   Problem Relation Age of Onset   ??? Cirrhosis Mother    ??? Other Mother      Hepatitis B   ??? High Blood Pressure Father    ??? Cancer Maternal Grandmother    ??? Arthritis Maternal Grandmother    ??? Cancer Paternal Grandmother    ??? Scoliosis Paternal Grandmother    ??? High Blood Pressure Paternal Grandfather      Social History     Social History   ??? Marital status: Single     Spouse name: N/A   ??? Number of children: N/A   ??? Years of education: N/A     Occupational History   ??? Not on file.     Social History Main Topics   ??? Smoking status: Current Every Day Smoker     Packs/day: 0.50     Years: 38.00     Types: Cigarettes   ??? Smokeless tobacco: Never Used   ??? Alcohol use Yes      Comment: recentyl mdrinking daily    ??? Drug use: Yes     Types: Cocaine      Comment: last used two days ago   ??? Sexual activity: Not Currently     Other Topics Concern   ??? Not on file     Social History Narrative   ??? No narrative on file  LABS:  Lab Results   Component Value Date    LITHIUM 0.2 (L) 08/04/2015    NA 140 05/14/2016    BUN 12 05/14/2016    CREATININE 0.93 (H) 05/14/2016    TSH 1.290 05/14/2016    WBC 6.7 05/14/2016     No results found for: PHENYTOIN, PHENOBARB, VALPROATE, CBMZ  Lab Results   Component Value Date    INR 1.1 05/18/2016    PROTIME 11.5 05/18/2016     Lab Results   Component Value Date    APTT 39.3 (H) 03/31/2016     No results for input(s): ETOH in the last 72 hours.        ROS:  [x]  All negative/unchanged except if checked. Explain positive(checked items) below:  []  Constitutional  []  Eyes  []  Ear/Nose/Mouth/Throat  []  Respiratory  []  CV  []  GI  []  GU  []  Musculoskeletal  []  Skin/Breast  []  Neurological  []  Endocrine  []  Heme/Lymph  []  Allergic/Immunologic    Explanation:     MEDICATIONS:    Current Facility-Administered Medications:   ???  fluticasone (FLONASE) 50 MCG/ACT nasal spray 1 spray, 1 spray,  Each Nare, Daily, Philipp Ovens, PA-C  ???  warfarin (COUMADIN) tablet 5 mg, 5 mg, Oral, Daily, Philipp Ovens, PA-C  ???  enoxaparin (LOVENOX) injection 80 mg, 1 mg/kg, Subcutaneous, BID, Tiffany T Brown, PA-C, 80 mg at 05/18/16 1405  ???  QUEtiapine (SEROQUEL) tablet 25 mg, 25 mg, Oral, BID- 8&2, Desma Paganini, MD, 25 mg at 05/18/16 1410  ???  QUEtiapine (SEROQUEL) tablet 100 mg, 100 mg, Oral, Nightly, Desma Paganini, MD, 100 mg at 05/17/16 2124  ???  topiramate (TOPAMAX) tablet 25 mg, 25 mg, Oral, BID, Desma Paganini, MD, 25 mg at 05/18/16 0902  ???  OLANZapine (ZYPREXA) tablet 10 mg, 10 mg, Oral, Nightly, Desma Paganini, MD, 10 mg at 05/17/16 2124  ???  nicotine polacrilex (NICORETTE) gum 4 mg, 4 mg, Oral, PRN, Gillermina Hu, MD, 4 mg at 05/18/16 1454  ???  albuterol sulfate HFA 108 (90 Base) MCG/ACT inhaler 2 puff, 2 puff, Inhalation, Q4H PRN, Desma Paganini, MD  ???  aspirin chewable tablet 81 mg, 81 mg, Oral, Daily, Desma Paganini, MD, 81 mg at 05/18/16 0902  ???  atorvastatin (LIPITOR) tablet 80 mg, 80 mg, Oral, Nightly, Desma Paganini, MD, 80 mg at 05/17/16 2124  ???  mometasone-formoterol (DULERA) 200-5 MCG/ACT inhaler 2 puff, 2 puff, Inhalation, BID, Desma Paganini, MD, 2 puff at 05/17/16 2053  ???  folic acid (FOLVITE) tablet 1 mg, 1 mg, Oral, Daily, Desma Paganini, MD, 1 mg at 05/18/16 0901  ???  ibuprofen (ADVIL;MOTRIN) tablet 400 mg, 400 mg, Oral, Q8H PRN, Desma Paganini, MD, 400 mg at 05/18/16 0741  ???  melatonin tablet 5 mg, 5 mg, Oral, Nightly, Desma Paganini, MD, 5 mg at 05/17/16 2124  ???  metoprolol tartrate (LOPRESSOR) tablet 25 mg, 25 mg, Oral, BID, Desma Paganini, MD, 25 mg at 05/18/16 1610  ???  nitroGLYCERIN (NITROSTAT) SL tablet 0.4 mg, 0.4 mg, Sublingual, Q5 Min PRN, Desma Paganini, MD  ???  pantoprazole (PROTONIX) tablet 40 mg, 40 mg, Oral, QAM AC, Desma Paganini, MD, 40 mg at 05/18/16 9604  ???  OXcarbazepine (TRILEPTAL) tablet 150 mg, 150 mg, Oral, BID, Desma Paganini, MD, 150 mg at 05/18/16 5409  ???  vitamin B-6  (PYRIDOXINE) tablet 100 mg, 100 mg, Oral, Daily, Desma Paganini, MD, 100 mg at 05/18/16 0902  ???  tiZANidine (ZANAFLEX) tablet 4 mg, 4  mg, Oral, TID, Desma Paganini, MD, 4 mg at 05/18/16 1410  ???  gabapentin (NEURONTIN) capsule 800 mg, 800 mg, Oral, Nightly, Desma Paganini, MD, 800 mg at 05/17/16 2124  ???  hydrOXYzine (VISTARIL) capsule 50 mg, 50 mg, Oral, Q6H PRN, 50 mg at 05/17/16 2124 **OR** hydrOXYzine (VISTARIL) injection 50 mg, 50 mg, Intramuscular, Q6H PRN, Desma Paganini, MD  ???  haloperidol (HALDOL) tablet 5 mg, 5 mg, Oral, Q6H PRN **OR** haloperidol lactate (HALDOL) injection 5 mg, 5 mg, Intramuscular, Q6H PRN, Desma Paganini, MD  ???  vitamin B-1 (THIAMINE) tablet 100 mg, 100 mg, Oral, Daily, Desma Paganini, MD, 100 mg at 05/18/16 0901  ???  multivitamin 1 tablet, 1 tablet, Oral, Daily, Desma Paganini, MD, 1 tablet at 05/18/16 0901  ???  acetaminophen (TYLENOL) tablet 650 mg, 650 mg, Oral, Q4H PRN, Desma Paganini, MD, 650 mg at 05/16/16 2111  ???  benztropine mesylate (COGENTIN) injection 2 mg, 2 mg, Intramuscular, BID PRN, Desma Paganini, MD  ???  aluminum & magnesium hydroxide-simethicone (MAALOX) 200-200-20 MG/5ML suspension 15 mL, 15 mL, Oral, PRN, Desma Paganini, MD    PSYCHOTHERAPY/COUNSELING:   Therapeutic interview   Supportive   CBT   Ongoing   Other    ASSESSMENT:  Patient is:   Well controlled   Improving   Worsening   Other     Patient continues to need, on a daily basis, active treatment furnished directly by or     requiring the supervision of inpatient psychiatric personnel     Diagnosis:  Principal Problem:    Bipolar 1 disorder, depressed, severe (HCC)  Active Problems:    Cocaine abuse  Resolved Problems:    * No resolved hospital problems. *      Treatment Plan:   Continue Current Medications   Continue Follow-up   Continue Labs       Risks, benefits, side effects, drug-to-drug interactions and alternatives to treatment were discussed in my usual manner.    Reason  for more than one antipsychotic:   N/A   3 failed monotherapy(drugs tried):   Cross over to a new antipsychotic   Taper to monotherapy from polypharmacy   Augmentation of Clozapine therapy due to treatment resistance to single therapy      Electronically signed by Desma Paganini, MD on 05/18/2016 at 5:26 PM

## 2016-05-19 LAB — PROTIME-INR
INR: 1.1
Protime: 11.7 s (ref 8.1–13.7)

## 2016-05-19 MED ORDER — MAGNESIUM HYDROXIDE 400 MG/5ML PO SUSP
400 MG/5ML | Freq: Every day | ORAL | Status: DC | PRN
Start: 2016-05-19 — End: 2016-05-20
  Administered 2016-05-19: 03:00:00 30 mL via ORAL

## 2016-05-19 MED ORDER — WARFARIN DAILY DOSING BY PHARMACIST (PLACEHOLDER)
Status: DC
Start: 2016-05-19 — End: 2016-05-20

## 2016-05-19 MED ORDER — WARFARIN SODIUM 2.5 MG PO TABS
2.5 MG | Freq: Once | ORAL | Status: AC
Start: 2016-05-19 — End: 2016-05-19
  Administered 2016-05-19: 21:00:00 10 mg via ORAL

## 2016-05-19 MED ORDER — DOCUSATE SODIUM 100 MG PO CAPS
100 MG | Freq: Two times a day (BID) | ORAL | Status: DC
Start: 2016-05-19 — End: 2016-05-20
  Administered 2016-05-19 – 2016-05-20 (×4): 100 mg via ORAL

## 2016-05-19 MED FILL — METOPROLOL TARTRATE 25 MG PO TABS: 25 MG | ORAL | Qty: 1

## 2016-05-19 MED FILL — TIZANIDINE HCL 4 MG PO TABS: 4 MG | ORAL | Qty: 1

## 2016-05-19 MED FILL — GNP VITAMIN B-6 100 MG PO TABS: 100 MG | ORAL | Qty: 1

## 2016-05-19 MED FILL — ATORVASTATIN CALCIUM 80 MG PO TABS: 80 MG | ORAL | Qty: 1

## 2016-05-19 MED FILL — GABAPENTIN 400 MG PO CAPS: 400 MG | ORAL | Qty: 2

## 2016-05-19 MED FILL — QUETIAPINE FUMARATE 25 MG PO TABS: 25 MG | ORAL | Qty: 1

## 2016-05-19 MED FILL — QUETIAPINE FUMARATE 100 MG PO TABS: 100 MG | ORAL | Qty: 1

## 2016-05-19 MED FILL — TOPIRAMATE 25 MG PO TABS: 25 MG | ORAL | Qty: 1

## 2016-05-19 MED FILL — NICORELIEF 4 MG MT GUM: 4 MG | OROMUCOSAL | Qty: 1

## 2016-05-19 MED FILL — MELATONIN 1 MG PO TABS: 1 MG | ORAL | Qty: 2

## 2016-05-19 MED FILL — CHILDRENS ASPIRIN 81 MG PO CHEW: 81 MG | ORAL | Qty: 1

## 2016-05-19 MED FILL — OLANZAPINE 10 MG PO TABS: 10 MG | ORAL | Qty: 1

## 2016-05-19 MED FILL — B-1 100 MG PO TABS: 100 MG | ORAL | Qty: 1

## 2016-05-19 MED FILL — LOVENOX 80 MG/0.8ML SC SOLN: 80 MG/0.8ML | SUBCUTANEOUS | Qty: 0.8

## 2016-05-19 MED FILL — IBUPROFEN 400 MG PO TABS: 400 MG | ORAL | Qty: 1

## 2016-05-19 MED FILL — DOCUSATE SODIUM 100 MG PO CAPS: 100 MG | ORAL | Qty: 1

## 2016-05-19 MED FILL — OXCARBAZEPINE 150 MG PO TABS: 150 MG | ORAL | Qty: 1

## 2016-05-19 MED FILL — COUMADIN 2.5 MG PO TABS: 2.5 MG | ORAL | Qty: 4

## 2016-05-19 MED FILL — ENOXAPARIN SODIUM 80 MG/0.8ML SC SOLN: 80 MG/0.8ML | SUBCUTANEOUS | Qty: 0.8

## 2016-05-19 MED FILL — MILK OF MAGNESIA 7.75 % PO SUSP: 7.75 % | ORAL | Qty: 30

## 2016-05-19 MED FILL — THEREMS PO TABS: ORAL | Qty: 1

## 2016-05-19 MED FILL — FOLIC ACID 1 MG PO TABS: 1 MG | ORAL | Qty: 1

## 2016-05-19 MED FILL — PANTOPRAZOLE SODIUM 40 MG PO TBEC: 40 MG | ORAL | Qty: 1

## 2016-05-19 NOTE — Care Coordination-Inpatient (Signed)
Group Therapy Note    Date: 05/19/2016  Start Time: 3:10  End Time:  3:40  Number of Participants: 4    Type of Group: Recreational    Wellness Binder Information  Module Name:  x  Session Number:  x    Patient's Goal:  Participate in group.     Notes:  Patient was engaged in group.     Status After Intervention:  Improved    Participation Level: Interactive    Participation Quality: Appropriate      Speech:  normal      Thought Process/Content: Logical      Affective Functioning: Congruent      Mood: euphoric      Level of consciousness:  Alert      Response to Learning: Able to verbalize current knowledge/experience      Endings: None Reported    Modes of Intervention: Socialization      Discipline Responsible: Social Worker/Counselor     Electronically signed by Noelle Penner, MSW, LSW on 05/19/2016 at 3:21 PM        Signature:  Noelle Penner, MSW, LSW

## 2016-05-19 NOTE — Behavioral Health Treatment Team (Signed)
Pt did not attend Recreation group at 1930.    Electronically signed by Emi Holes on 05/19/2016 at 10:27 PM

## 2016-05-19 NOTE — Progress Notes (Signed)
Group Therapy Note    Date: 05/19/2016  Start Time: 1000  End Time:  1045  Number of Participants: 4    Type of Group: Psychoeducation    Wellness Binder Information  Module Name:    Session Number:      Patient's Goal:  "To feel better"    Notes:  Patient work adequately on her task but was slightly distracted and left the group early.     Status After Intervention:  Unchanged    Participation Level: Minimal     Participation Quality: Appropriate      Speech:  normal      Thought Process/Content: Linear      Affective Functioning: Flat      Mood: calm      Level of consciousness:  Preoccupied      Response to Learning: Progressing to goal      Endings: None Reported    Modes of Intervention: Education, Socialization and Activity      Discipline Responsible: Psychoeducational Specialist      Signature:  Tressie Ellis

## 2016-05-19 NOTE — Plan of Care (Signed)
Problem: Altered Mood, Psychotic Behavior:  Goal: Able to verbalize decrease in frequency and intensity of hallucinations  Able to verbalize decrease in frequency and intensity of hallucinations   Outcome: Ongoing    Goal: Able to verbalize reality based thinking  Able to verbalize reality based thinking   Outcome: Ongoing    Goal: Absence of self-harm  Absence of self-harm   Outcome: Completed Date Met: 05/19/16    Goal: Ability to interact with others will improve  Ability to interact with others will improve   Outcome: Completed Date Met: 05/19/16    Goal: Compliance with prescribed medication regimen will improve  Compliance with prescribed medication regimen will improve   Outcome: Completed Date Met: 05/19/16

## 2016-05-19 NOTE — Progress Notes (Signed)
Clinical Pharmacy Note    Alisha Mccall is a 51 y.o. female for whom pharmacy has been asked to manage warfarin therapy.    Reason for Admission: Bipolar 1 disorder, depressed, severe (HCC) [F31.4]    Consulting Physician: Lenor Derrick, PA-C  Warfarin dose prior to admission: 5 mg PO daily   Warfarin Indication: Pulmonary Embolism  Target INR range: 2-3  Order for concomitant anticoagulant therapy: Lovenox 1 mg/kg (80 mg) SQ BID    Outpatient warfarin provider: Previously was [facility], however patient was discharged from service on 2 separate occassions for missed appointments    Past Medical History:   Diagnosis Date   ??? Anxiety    ??? CAD (coronary artery disease)    ??? Cardiac angina (HCC)    ??? CHF (congestive heart failure) (HCC)    ??? COPD (chronic obstructive pulmonary disease) (HCC)    ??? 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 GERD (gastroesophageal reflux disease)    ??? History of blood transfusion    ??? Hyperlipidemia    ??? Hypertension    ??? MI (myocardial infarction)    ??? Neuromuscular disorder (HCC)    ??? Osteoarthritis    ??? Pneumonia    ??? Psychiatric problem    ??? Pulmonary embolism (HCC)    ??? Scoliosis                Recent Labs      05/19/16   0747   INR  1.1     No results for input(s): HGB, HCT, PLT in the last 72 hours.    Current warfarin drug-drug interactions: trazodone, atorvastatin    Date INR Warfarin Dose   05/18/16 1.1 5 mg   05/19/16 (consult received) 1.1 10 mg                              INR is subtherapeutic at 1.1, goal is 2-3. Will order warfarin 10 mg PO ONCE to boost patient. Daily PT/INR until stable within therapeutic range.    Thank you for the consult.  Karlene Lineman. Quincy Sheehan, PharmD  PGY-1 Pharmacy Resident  05/19/2016 3:28 PM

## 2016-05-19 NOTE — Plan of Care (Signed)
Problem: Substance Abuse:  Goal: Absence of drug withdrawal signs and symptoms  Absence of drug withdrawal signs and symptoms   Outcome: Completed Date Met: 05/19/16    Goal: Participates in care planning  Participates in care planning   Outcome: Ongoing

## 2016-05-19 NOTE — Progress Notes (Signed)
BEHAVIORAL HEALTH FOLLOW-UP NOTE     05/19/2016     Patient was seen and examined in person, Chart reviewed   Patient's case discussed with staff/team    Chief Complaint: depression, SI    Interim History:     Pt has been feeling better   Less depressed  Pt is feeling hopeless and worthless  Pt is on 2 antipsychotic  Pt is on warfarin and lovenox  Appetite:    Normal/Unchanged   Increased   Decreased      Sleep:        Normal/Unchanged   Fair        Poor              Energy:     Normal/Unchanged   Increased   Decreased        SI  Present   Absent    HI  Present   Absent     Aggression:   yes   no    Patient is  able   unable to CONTRACT FOR SAFETY     PAST MEDICAL/PSYCHIATRIC HISTORY:   Past Medical History:   Diagnosis Date   ??? Anxiety    ??? CAD (coronary artery disease)    ??? Cardiac angina (HCC)    ??? CHF (congestive heart failure) (HCC)    ??? COPD (chronic obstructive pulmonary disease) (HCC)    ??? 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 GERD (gastroesophageal reflux disease)    ??? History of blood transfusion    ??? Hyperlipidemia    ??? Hypertension    ??? MI (myocardial infarction)    ??? Neuromuscular disorder (HCC)    ??? Osteoarthritis    ??? Pneumonia    ??? Psychiatric problem    ??? Pulmonary embolism (HCC)    ??? Scoliosis        FAMILY/SOCIAL HISTORY:  Family History   Problem Relation Age of Onset   ??? Cirrhosis Mother    ??? Other Mother      Hepatitis B   ??? High Blood Pressure Father    ??? Cancer Maternal Grandmother    ??? Arthritis Maternal Grandmother    ??? Cancer Paternal Grandmother    ??? Scoliosis Paternal Grandmother    ??? High Blood Pressure Paternal Grandfather      Social History     Social History   ??? Marital status: Single     Spouse name: N/A   ??? Number of children: N/A   ??? Years of education: N/A     Occupational History   ??? Not on file.     Social History Main Topics   ??? Smoking status: Current Every Day Smoker     Packs/day: 0.50     Years: 38.00     Types: Cigarettes   ???  Smokeless tobacco: Never Used   ??? Alcohol use Yes      Comment: recentyl mdrinking daily    ??? Drug use: Yes     Types: Cocaine      Comment: last used two days ago   ??? Sexual activity: Not Currently     Other Topics Concern   ??? Not on file     Social History Narrative   ??? No narrative on file           ROS:   All negative/unchanged except if checked. Explain positive(checked items) below:   Constitutional   Eyes   Ear/Nose/Mouth/Throat   Respiratory    CV   GI   GU   Musculoskeletal   Skin/Breast   Neurological   Endocrine   Heme/Lymph   Allergic/Immunologic    Explanation:     MEDICATIONS:    Current Facility-Administered Medications:   ???  fluticasone (FLONASE) 50 MCG/ACT nasal spray 1 spray, 1 spray, Each Nare, Daily, Philipp Ovens, PA-C  ???  warfarin (COUMADIN) tablet 5 mg, 5 mg, Oral, Daily, Tiffany T Brown, PA-C, 5 mg at 05/18/16 1824  ???  enoxaparin (LOVENOX) injection 80 mg, 1 mg/kg, Subcutaneous, BID, Philipp Ovens, PA-C, 80 mg at 05/18/16 2253  ???  magnesium hydroxide (MILK OF MAGNESIA) 400 MG/5ML suspension 30 mL, 30 mL, Oral, Daily PRN, Philipp Ovens, PA-C, 30 mL at 05/18/16 2235  ???  docusate sodium (COLACE) capsule 100 mg, 100 mg, Oral, BID, Tiffany T Brown, PA-C, 100 mg at 05/19/16 0818  ???  QUEtiapine (SEROQUEL) tablet 25 mg, 25 mg, Oral, BID- 8&2, Desma Paganini, MD, 25 mg at 05/19/16 1610  ???  QUEtiapine (SEROQUEL) tablet 100 mg, 100 mg, Oral, Nightly, Desma Paganini, MD, 100 mg at 05/18/16 2102  ???  topiramate (TOPAMAX) tablet 25 mg, 25 mg, Oral, BID, Desma Paganini, MD, 25 mg at 05/19/16 0818  ???  OLANZapine (ZYPREXA) tablet 10 mg, 10 mg, Oral, Nightly, Desma Paganini, MD, 10 mg at 05/18/16 2104  ???  nicotine polacrilex (NICORETTE) gum 4 mg, 4 mg, Oral, PRN, Gillermina Hu, MD, 4 mg at 05/19/16 0819  ???  albuterol sulfate HFA 108 (90 Base) MCG/ACT inhaler 2 puff, 2 puff, Inhalation, Q4H PRN, Desma Paganini, MD  ???  aspirin chewable tablet 81 mg, 81 mg, Oral, Daily, Desma Paganini, MD, 81 mg at 05/19/16 0819  ???  atorvastatin (LIPITOR) tablet 80 mg, 80 mg, Oral, Nightly, Desma Paganini, MD, 80 mg at 05/18/16 2103  ???  mometasone-formoterol (DULERA) 200-5 MCG/ACT inhaler 2 puff, 2 puff, Inhalation, BID, Desma Paganini, MD, 2 puff at 05/19/16 9604  ???  folic acid (FOLVITE) tablet 1 mg, 1 mg, Oral, Daily, Desma Paganini, MD, 1 mg at 05/19/16 0818  ???  ibuprofen (ADVIL;MOTRIN) tablet 400 mg, 400 mg, Oral, Q8H PRN, Desma Paganini, MD, 400 mg at 05/19/16 0818  ???  melatonin tablet 5 mg, 5 mg, Oral, Nightly, Desma Paganini, MD, 5 mg at 05/18/16 2102  ???  metoprolol tartrate (LOPRESSOR) tablet 25 mg, 25 mg, Oral, BID, Desma Paganini, MD, 25 mg at 05/19/16 0818  ???  nitroGLYCERIN (NITROSTAT) SL tablet 0.4 mg, 0.4 mg, Sublingual, Q5 Min PRN, Desma Paganini, MD  ???  pantoprazole (PROTONIX) tablet 40 mg, 40 mg, Oral, QAM AC, Desma Paganini, MD, 40 mg at 05/19/16 5409  ???  OXcarbazepine (TRILEPTAL) tablet 150 mg, 150 mg, Oral, BID, Desma Paganini, MD, 150 mg at 05/19/16 0818  ???  vitamin B-6 (PYRIDOXINE) tablet 100 mg, 100 mg, Oral, Daily, Desma Paganini, MD, 100 mg at 05/19/16 0819  ???  tiZANidine (ZANAFLEX) tablet 4 mg, 4 mg, Oral, TID, Desma Paganini, MD, 4 mg at 05/19/16 8119  ???  gabapentin (NEURONTIN) capsule 800 mg, 800 mg, Oral, Nightly, Desma Paganini, MD, 800 mg at 05/18/16 2103  ???  hydrOXYzine (VISTARIL) capsule 50 mg, 50 mg, Oral, Q6H PRN, 50 mg at 05/17/16 2124 **OR** hydrOXYzine (VISTARIL) injection 50 mg, 50 mg, Intramuscular, Q6H PRN, Desma Paganini, MD  ???  haloperidol (HALDOL) tablet 5 mg, 5 mg, Oral, Q6H PRN **OR** haloperidol lactate (HALDOL) injection 5 mg, 5 mg, Intramuscular, Q6H PRN, Desma Paganini, MD  ???  vitamin B-1 (THIAMINE) tablet 100 mg, 100 mg, Oral, Daily, Desma Paganini, MD, 100 mg at 05/19/16 0818  ???  multivitamin 1 tablet, 1 tablet, Oral, Daily, Desma Paganini, MD, 1 tablet at 05/19/16 0818  ???  acetaminophen (TYLENOL) tablet 650 mg, 650 mg, Oral, Q4H PRN, Desma Paganini, MD,  650 mg at 05/16/16 2111  ???  benztropine mesylate (COGENTIN) injection 2 mg, 2 mg, Intramuscular, BID PRN, Desma Paganini, MD  ???  aluminum & magnesium hydroxide-simethicone (MAALOX) 200-200-20 MG/5ML suspension 15 mL, 15 mL, Oral, PRN, Desma Paganini, MD      Examination:  BP 137/85    Pulse 76    Temp 98 ??F (36.7 ??C) (Oral)    Resp 16    Ht  (1.626 m)    Wt 187 lb (84.8 kg)    SpO2 98%    BMI 32.10 kg/m??   Gait - steady  Medication side effects(SE): no    Mental Status Examination:    Level of consciousness:  within normal limits   Appearance:  fair grooming and fair hygiene  Behavior/Motor:  no abnormalities noted  Attitude toward examiner:  cooperative  Speech:  slow   Mood:less depressed  Affect:  mood congruent  Thought processes:  linear   Thought content:  Delusions:  no evidence of delusions  Perceptual Disturbance:  denies any perceptual disturbance  Cognition:  oriented to person, place, and time   Concentration distractible  Insight fair   Judgement fair     ASSESSMENT:   Patient symptoms are:   Well controlled   Improving   Worsening   No change      Diagnosis:   Principal Problem:    Bipolar 1 disorder, depressed, severe (HCC)  Active Problems:    Cocaine abuse  Resolved Problems:    * No resolved hospital problems. *      LABS:    No results for input(s): WBC, HGB, PLT in the last 72 hours.  No results for input(s): NA, K, CL, CO2, BUN, CREATININE, GLUCOSE in the last 72 hours.  No results for input(s): BILITOT, ALKPHOS, AST, ALT in the last 72 hours.  Lab Results   Component Value Date    [facility] POSITIVE 05/14/2016    BARBSCNU Neg 05/14/2016    LABBENZ Neg 05/14/2016    LABBENZ NotDTCD 11/27/2010    OPIATESCREENURINE Neg 05/14/2016    PHENCYCLIDINESCREENURINE Neg 05/14/2016    ETOH <10 05/14/2016     Lab Results   Component Value Date    TSH 1.290 05/14/2016     Lab Results   Component Value Date    LITHIUM 0.2 (L) 08/04/2015     No results found for: VALPROATE, CBMZ        Treatment  Plan:  Reviewed current Medications with the patient.   Pt is on 2 antipsychotic  Lipid panel ordered  Risks, benefits, side effects, drug-to-drug interactions and alternatives to treatment were discussed.  Collateral information  CD evaluation  Encourage patient to attend group and other milieu activities.  Discharge planning discussed with the patient and treatment team.    PSYCHOTHERAPY/COUNSELING:   Therapeutic interview   Supportive   CBT   Ongoing   Other  Patient was seen 1:1 for 20 minutes, other than E&M time spent, focusing on      - coping skills techniques     - Anxiety management techniques discussed including deep breathing exercise and PMR     - discussing patients strength and weakness      -  Motivational interviewing to assess the stage of change and assessing patient readiness to quit substance use.        Patient continues to need, on a daily basis, active treatment furnished directly by or requiring the supervision of inpatient psychiatric personnel ??    Anticipated Length of stay:    ??  ??    Electronically signed by Gillermina Hu, MD on 05/19/2016 at 9:33 AM

## 2016-05-19 NOTE — Progress Notes (Signed)
Pt. attended the 0900 community meeting.Electronically signed by Tressie Ellis, RAC on 05/19/2016 at 9:49 AM

## 2016-05-19 NOTE — Plan of Care (Signed)
Problem: Altered Mood, Psychotic Behavior:  Goal: Able to verbalize decrease in frequency and intensity of hallucinations  Able to verbalize decrease in frequency and intensity of hallucinations   Outcome: Met This Shift    Goal: Able to verbalize reality based thinking  Able to verbalize reality based thinking   Outcome: Ongoing    Goal: Absence of self-harm  Absence of self-harm   Outcome: Met This Shift    Goal: Ability to interact with others will improve  Ability to interact with others will improve   Outcome: Ongoing    Goal: Compliance with prescribed medication regimen will improve  Compliance with prescribed medication regimen will improve   Outcome: Met This Shift      Problem: Substance Abuse:  Goal: Absence of drug withdrawal signs and symptoms  Absence of drug withdrawal signs and symptoms   Outcome: Met This Shift    Goal: Participates in care planning  Participates in care planning   Outcome: Met This Shift    Goal: Patient specific goal  Patient specific goal   Outcome: Met This Shift      Problem: Pain:  Goal: Pain level will decrease  Pain level will decrease   Outcome: Met This Shift    Goal: Control of chronic pain  Control of chronic pain   Outcome: Met This Shift

## 2016-05-19 NOTE — Care Coordination-Inpatient (Signed)
Group Therapy Note    Date: 05/19/2016  Start Time: 1110  End Time:  1145  Number of Participants: 6    Type of Group: Psychotherapy    Wellness Binder Information  Module Name:  x  Session Number:  x    Patient's Goal:  To go home    Notes:  Patient stated she is being discharged tomorrow    Status After Intervention:  Improved    Participation Level: Interactive    Participation Quality: Appropriate      Speech:  normal      Thought Process/Content: Logical      Affective Functioning: Congruent      Mood: good      Level of consciousness:  Alert      Response to Learning: Able to verbalize current knowledge/experience      Endings: None Reported    Modes of Intervention: Support      Discipline Responsible: Social Worker/Counselor   Electronically signed by Melven Sartorius, LPCC on 05/19/2016 at 12:17 PM      Signature:  Melven Sartorius, [employer]

## 2016-05-19 NOTE — Progress Notes (Signed)
Group Therapy Note    Date: 05/19/16  Start Time: 1435  End Time:  1510    Number of Participants:8    Type of Group: Psychoeducation    Patient's Goal:  To participate in mood management group.    Notes: Patient declined to attend psychoeducation group at 1435despite encouragement by staff.     Discipline Responsible: Social Worker/Counselor    Lucretia Field, LISW

## 2016-05-19 NOTE — Plan of Care (Signed)
Problem: Pain:  Goal: Pain level will decrease  Pain level will decrease   Outcome: Met This Shift    Goal: Control of acute pain  Control of acute pain   Outcome: Met This Shift    Goal: Control of chronic pain  Control of chronic pain   Outcome: Met This Shift

## 2016-05-19 NOTE — Progress Notes (Signed)
Alert and oriented x3. Visible in day room, social with peers, theatrical with nursing students. Appears manic, giggling and laughing, talkative. Pt reports her mood as "happy, happy, happy" and her energy level as "a lot! That's just the mania." Pt plans to follow up with the [facility] and to have her sister stay with her at her house for a few weeks after discharge.

## 2016-05-20 LAB — PROTIME-INR
INR: 1.3
Protime: 13.2 s (ref 8.1–13.7)

## 2016-05-20 LAB — LIPID PANEL
Cholesterol, Total: 146 mg/dL (ref 0–199)
HDL: 56 mg/dL (ref 40–59)
LDL Calculated: 74 mg/dL (ref 0–129)
Triglycerides: 81 mg/dL (ref 0–200)

## 2016-05-20 MED ORDER — OMEPRAZOLE 20 MG PO CPDR
20 MG | ORAL_CAPSULE | Freq: Every day | ORAL | 1 refills | Status: DC
Start: 2016-05-20 — End: 2016-05-20

## 2016-05-20 MED ORDER — QUETIAPINE FUMARATE 100 MG PO TABS
100 MG | ORAL_TABLET | Freq: Every evening | ORAL | 2 refills | Status: AC
Start: 2016-05-20 — End: ?

## 2016-05-20 MED ORDER — QUETIAPINE FUMARATE 25 MG PO TABS
25 MG | ORAL_TABLET | Freq: Two times a day (BID) | ORAL | 2 refills | Status: AC
Start: 2016-05-20 — End: ?

## 2016-05-20 MED ORDER — FLUTICASONE PROPIONATE 50 MCG/ACT NA SUSP
50 MCG/ACT | Freq: Every day | NASAL | 3 refills | Status: AC
Start: 2016-05-20 — End: ?

## 2016-05-20 MED ORDER — ATORVASTATIN CALCIUM 80 MG PO TABS
80 MG | ORAL_TABLET | Freq: Every evening | ORAL | 3 refills | Status: AC
Start: 2016-05-20 — End: ?

## 2016-05-20 MED ORDER — ASPIRIN 81 MG PO CHEW
81 MG | ORAL_TABLET | Freq: Every day | ORAL | 3 refills | Status: AC
Start: 2016-05-20 — End: ?

## 2016-05-20 MED ORDER — OXCARBAZEPINE 150 MG PO TABS
150 MG | ORAL_TABLET | Freq: Two times a day (BID) | ORAL | 2 refills | Status: AC
Start: 2016-05-20 — End: ?

## 2016-05-20 MED ORDER — METOPROLOL TARTRATE 25 MG PO TABS
25 MG | ORAL_TABLET | Freq: Two times a day (BID) | ORAL | 3 refills | Status: AC
Start: 2016-05-20 — End: ?

## 2016-05-20 MED ORDER — WARFARIN SODIUM 2.5 MG PO TABS
2.5 MG | Freq: Once | ORAL | Status: DC
Start: 2016-05-20 — End: 2016-05-20

## 2016-05-20 MED ORDER — FLUTICASONE-SALMETEROL 250-50 MCG/DOSE IN AEPB
250-50 | Freq: Two times a day (BID) | RESPIRATORY_TRACT | 3 refills | Status: AC
Start: 2016-05-20 — End: ?

## 2016-05-20 MED ORDER — PANTOPRAZOLE SODIUM 40 MG PO TBEC
40 MG | ORAL_TABLET | Freq: Every day | ORAL | 3 refills | Status: AC
Start: 2016-05-20 — End: ?

## 2016-05-20 MED ORDER — NITROGLYCERIN 0.4 MG SL SUBL
0.4 MG | ORAL_TABLET | SUBLINGUAL | 3 refills | Status: AC
Start: 2016-05-20 — End: ?

## 2016-05-20 MED ORDER — ENOXAPARIN SODIUM 80 MG/0.8ML SC SOLN
80 MG/0.8ML | INJECTION | Freq: Two times a day (BID) | SUBCUTANEOUS | 0 refills | Status: AC
Start: 2016-05-20 — End: 2016-05-23

## 2016-05-20 MED ORDER — OLANZAPINE 10 MG PO TABS
10 MG | ORAL_TABLET | Freq: Every evening | ORAL | 2 refills | Status: AC
Start: 2016-05-20 — End: ?

## 2016-05-20 MED ORDER — WARFARIN SODIUM 5 MG PO TABS
5 MG | ORAL_TABLET | ORAL | 3 refills | Status: AC
Start: 2016-05-20 — End: ?

## 2016-05-20 MED FILL — GABAPENTIN 400 MG PO CAPS: 400 MG | ORAL | Qty: 2

## 2016-05-20 MED FILL — DOCUSATE SODIUM 100 MG PO CAPS: 100 MG | ORAL | Qty: 1

## 2016-05-20 MED FILL — METOPROLOL TARTRATE 25 MG PO TABS: 25 MG | ORAL | Qty: 1

## 2016-05-20 MED FILL — IBUPROFEN 400 MG PO TABS: 400 MG | ORAL | Qty: 1

## 2016-05-20 MED FILL — ENOXAPARIN SODIUM 80 MG/0.8ML SC SOLN: 80 MG/0.8ML | SUBCUTANEOUS | Qty: 0.8

## 2016-05-20 MED FILL — FOLIC ACID 1 MG PO TABS: 1 MG | ORAL | Qty: 1

## 2016-05-20 MED FILL — B-1 100 MG PO TABS: 100 MG | ORAL | Qty: 1

## 2016-05-20 MED FILL — NICORELIEF 4 MG MT GUM: 4 MG | OROMUCOSAL | Qty: 1

## 2016-05-20 MED FILL — TIZANIDINE HCL 4 MG PO TABS: 4 MG | ORAL | Qty: 1

## 2016-05-20 MED FILL — THEREMS PO TABS: ORAL | Qty: 1

## 2016-05-20 MED FILL — QUETIAPINE FUMARATE 25 MG PO TABS: 25 MG | ORAL | Qty: 1

## 2016-05-20 MED FILL — GNP VITAMIN B-6 100 MG PO TABS: 100 MG | ORAL | Qty: 1

## 2016-05-20 MED FILL — TOPIRAMATE 25 MG PO TABS: 25 MG | ORAL | Qty: 1

## 2016-05-20 MED FILL — CHILDRENS ASPIRIN 81 MG PO CHEW: 81 MG | ORAL | Qty: 1

## 2016-05-20 MED FILL — OXCARBAZEPINE 150 MG PO TABS: 150 MG | ORAL | Qty: 1

## 2016-05-20 MED FILL — PANTOPRAZOLE SODIUM 40 MG PO TBEC: 40 MG | ORAL | Qty: 1

## 2016-05-20 NOTE — Progress Notes (Signed)
Pt. attended the 0900 community meeting. Electronically signed by Mliss Sax, CTRS on 05/20/2016 at 9:54 AM

## 2016-05-20 NOTE — Discharge Summary (Signed)
DISCHARGE SUMMARY      Patient ID:  Alisha Mccall  [id number]  51 y.o.  [birth year]    Admit date: 05/14/2016    Discharge date and time: 05/20/2016    Admitting Physician: Desma Paganini, MD     Discharge Physician: Dr Wynelle Bourgeois MD    Admission Diagnoses: Bipolar 1 disorder, depressed, severe (HCC) [F31.4]    Admission Condition: poor    Discharged Condition: stable    Admission Circumstance:   The patient is a 51 y.o. female with significant past history of bipolar disorder and addiction  Pt is in her room, not willing to talk  One word response  Pt report feeling depressed and suicidal  Patient report depressive symptoms, rating mood to be around 2/10 (10- good)  Pt has hopeless, worthless and helpless feeling  Suicidal thoughts - want to take overdose  Anxious about ongoing stressor, still ruminating about it  Pt has poor concentration, anhedonia, decrease motivation    Has been using cocaine and not willing to talk about the pattern    Psych ROS:  Manic symptoms- denies  Auditory hallucination - no  Visual hallucination - no  Paranoid thoughts-no  PTSD -no      The patient is not currently receiving care for the above psychiatric illness.    Medications Prior to Admission:   Prescriptions Prior to Admission: hydrocortisone 0.5 % cream,   albuterol (PROVENTIL) (2.5 MG/3ML) 0.083% nebulizer solution, inhale contents of 1 vial in nebulizer twice a day  RA VITAMIN D-3 2000 units CAPS, take 1 tablet by mouth once daily  fluticasone (FLONASE) 50 MCG/ACT nasal spray, instill 2 sprays into each nostril once daily  RA MELATONIN 10 MG TABS, take 1 tablet by mouth at bedtime  simvastatin (ZOCOR) 40 MG tablet, take 1 tablet by mouth every evening  tiZANidine (ZANAFLEX) 4 MG capsule, take 1 capsule by mouth twice a day if needed  topiramate (TOPAMAX) 100 MG tablet, take 1 tablet by mouth twice a day  BANOPHEN 50 MG capsule,   gabapentin (NEURONTIN) 600 MG tablet,   LATUDA 80 MG TABS tablet, take 1 tablet by mouth at  bedtime with food TO IMPROVE ABSORPTION  pentazocine-naloxone (TALWIN NX) 50-0.5 MG per tablet, TK 1 T PO BID PRF PAIN  OLANZapine (ZYPREXA) 15 MG tablet, Take 1 tablet by mouth nightly  prazosin (MINIPRESS) 5 MG capsule, Take 2 capsules by mouth nightly  OXcarbazepine (TRILEPTAL) 150 MG tablet, Take 1 tablet by mouth 2 times daily  melatonin 1 MG tablet, Take 5 tablets by mouth nightly  tiZANidine (ZANAFLEX) 4 MG tablet, Take 4 mg by mouth 3 times daily  warfarin (COUMADIN) 5 MG tablet, Take as directed by [facility] Anticoagulation Management Service. Quantity equals 30 day supply.  metoprolol tartrate (LOPRESSOR) 25 MG tablet, Take 1 tablet by mouth 2 times daily  ibuprofen (ADVIL;MOTRIN) 800 MG tablet, Take 0.5 tablets by mouth every 8 hours as needed for Pain or Fever  fluticasone-salmeterol (ADVAIR) 250-50 MCG/DOSE AEPB, Inhale 1 puff into the lungs 2 times daily  albuterol sulfate HFA 108 (90 BASE) MCG/ACT inhaler, Inhale 2 puffs into the lungs every 4 hours as needed for Wheezing  atorvastatin (LIPITOR) 80 MG tablet, Take 1 tablet by mouth nightly  aspirin 81 MG tablet, Take 81 mg by mouth daily  folic acid (FOLVITE) 1 MG tablet, Take 1 mg by mouth daily   ipratropium-albuterol (DUONEB) 0.5-2.5 (3) MG/3ML SOLN nebulizer solution,   NITROSTAT 0.4 MG SL tablet,  omeprazole (PRILOSEC) 20 MG capsule, Take 20 mg by mouth Daily   RA VITAMIN B-6 50 MG tablet, Take 100 mg by mouth daily     Compliance:no    Past Psychiatric History:  Prior Diagnosis: Bipolar I disorder; depressed episode, addiction  Psychiatrist: Dr Darin Engels  Therapist:no  Hospitalization: yes  Hx of Suicidal Attempts: yes  Hx of violence: no  ECT: no  Previous discontinued Psychiatric Med Trials: not recall        PAST MEDICAL/PSYCHIATRIC HISTORY:   Past Medical History:   Diagnosis Date   . Anxiety    . CAD (coronary artery disease)    . Cardiac angina (HCC)    . CHF (congestive heart failure) (HCC)    . COPD (chronic obstructive pulmonary  disease) (HCC)    . Disease of blood and blood forming organ    . GERD (gastroesophageal reflux disease)    . History of blood transfusion    . Hyperlipidemia    . Hypertension    . MI (myocardial infarction)    . Neuromuscular disorder (HCC)    . Osteoarthritis    . Pneumonia    . Psychiatric problem    . Pulmonary embolism (HCC)    . Scoliosis        FAMILY/SOCIAL HISTORY:  Family History   Problem Relation Age of Onset   . Cirrhosis Mother    . Other Mother      Hepatitis B   . High Blood Pressure Father    . Cancer Maternal Grandmother    . Arthritis Maternal Grandmother    . Cancer Paternal Grandmother    . Scoliosis Paternal Grandmother    . High Blood Pressure Paternal Grandfather      Social History     Social History   . Marital status: Single     Spouse name: N/A   . Number of children: N/A   . Years of education: N/A     Occupational History   . Not on file.     Social History Main Topics   . Smoking status: Current Every Day Smoker     Packs/day: 0.50     Years: 38.00     Types: Cigarettes   . Smokeless tobacco: Never Used   . Alcohol use Yes      Comment: recentyl mdrinking daily    . Drug use: Yes     Types: Cocaine      Comment: last used two days ago   . Sexual activity: Not Currently     Other Topics Concern   . Not on file     Social History Narrative   . No narrative on file       MEDICATIONS:    Current Facility-Administered Medications:   .  warfarin (COUMADIN) daily dosing (placeholder), , Other, RX Placeholder, Tiffany T Brown, PA-C  .  fluticasone (FLONASE) 50 MCG/ACT nasal spray 1 spray, 1 spray, Each Nare, Daily, Tiffany T Brown, PA-C  .  enoxaparin (LOVENOX) injection 80 mg, 1 mg/kg, Subcutaneous, BID, Tiffany T Brown, PA-C, 80 mg at 05/19/16 2046  .  magnesium hydroxide (MILK OF MAGNESIA) 400 MG/5ML suspension 30 mL, 30 mL, Oral, Daily PRN, Philipp Ovens, PA-C, 30 mL at 05/18/16 2235  .  docusate sodium (COLACE) capsule 100 mg, 100 mg, Oral, BID, Tiffany T Brown, PA-C, 100 mg at  05/19/16 2046  .  QUEtiapine (SEROQUEL) tablet 25 mg, 25 mg, Oral, BID- 8&2, Desma Paganini, MD, 25 mg  at 05/19/16 1428  .  QUEtiapine (SEROQUEL) tablet 100 mg, 100 mg, Oral, Nightly, Desma Paganini, MD, 100 mg at 05/19/16 2046  .  topiramate (TOPAMAX) tablet 25 mg, 25 mg, Oral, BID, Desma Paganini, MD, 25 mg at 05/19/16 2046  .  OLANZapine (ZYPREXA) tablet 10 mg, 10 mg, Oral, Nightly, Desma Paganini, MD, 10 mg at 05/19/16 2046  .  nicotine polacrilex (NICORETTE) gum 4 mg, 4 mg, Oral, PRN, Gillermina Hu, MD, 4 mg at 05/19/16 1726  .  albuterol sulfate HFA 108 (90 Base) MCG/ACT inhaler 2 puff, 2 puff, Inhalation, Q4H PRN, Desma Paganini, MD, 2 puff at 05/19/16 2053  .  aspirin chewable tablet 81 mg, 81 mg, Oral, Daily, Desma Paganini, MD, 81 mg at 05/19/16 0819  .  atorvastatin (LIPITOR) tablet 80 mg, 80 mg, Oral, Nightly, Desma Paganini, MD, 80 mg at 05/19/16 2046  .  mometasone-formoterol (DULERA) 200-5 MCG/ACT inhaler 2 puff, 2 puff, Inhalation, BID, Desma Paganini, MD, 2 puff at 05/20/16 [phone]  .  folic acid (FOLVITE) tablet 1 mg, 1 mg, Oral, Daily, Desma Paganini, MD, 1 mg at 05/19/16 0818  .  ibuprofen (ADVIL;MOTRIN) tablet 400 mg, 400 mg, Oral, Q8H PRN, Desma Paganini, MD, 400 mg at 05/19/16 1838  .  melatonin tablet 5 mg, 5 mg, Oral, Nightly, Desma Paganini, MD, 5 mg at 05/19/16 2046  .  metoprolol tartrate (LOPRESSOR) tablet 25 mg, 25 mg, Oral, BID, Desma Paganini, MD, 25 mg at 05/19/16 2046  .  nitroGLYCERIN (NITROSTAT) SL tablet 0.4 mg, 0.4 mg, Sublingual, Q5 Min PRN, Desma Paganini, MD  .  pantoprazole (PROTONIX) tablet 40 mg, 40 mg, Oral, QAM AC, Desma Paganini, MD, 40 mg at 05/20/16 0602  .  OXcarbazepine (TRILEPTAL) tablet 150 mg, 150 mg, Oral, BID, Desma Paganini, MD, 150 mg at 05/19/16 2046  .  vitamin B-6 (PYRIDOXINE) tablet 100 mg, 100 mg, Oral, Daily, Desma Paganini, MD, 100 mg at 05/19/16 0819  .  tiZANidine (ZANAFLEX) tablet 4 mg, 4 mg, Oral, TID, Desma Paganini, MD, 4 mg at 05/19/16 2046  .   gabapentin (NEURONTIN) capsule 800 mg, 800 mg, Oral, Nightly, Desma Paganini, MD, 800 mg at 05/19/16 2046  .  hydrOXYzine (VISTARIL) capsule 50 mg, 50 mg, Oral, Q6H PRN, 50 mg at 05/17/16 2124 **OR** hydrOXYzine (VISTARIL) injection 50 mg, 50 mg, Intramuscular, Q6H PRN, Desma Paganini, MD  .  haloperidol (HALDOL) tablet 5 mg, 5 mg, Oral, Q6H PRN **OR** haloperidol lactate (HALDOL) injection 5 mg, 5 mg, Intramuscular, Q6H PRN, Desma Paganini, MD  .  vitamin B-1 (THIAMINE) tablet 100 mg, 100 mg, Oral, Daily, Desma Paganini, MD, 100 mg at 05/19/16 0818  .  multivitamin 1 tablet, 1 tablet, Oral, Daily, Desma Paganini, MD, 1 tablet at 05/19/16 0818  .  acetaminophen (TYLENOL) tablet 650 mg, 650 mg, Oral, Q4H PRN, Desma Paganini, MD, 650 mg at 05/16/16 2111  .  benztropine mesylate (COGENTIN) injection 2 mg, 2 mg, Intramuscular, BID PRN, Desma Paganini, MD  .  aluminum & magnesium hydroxide-simethicone (MAALOX) 200-200-20 MG/5ML suspension 15 mL, 15 mL, Oral, PRN, Desma Paganini, MD    Examination:  BP 139/85   Pulse 83   Temp 97 F (36.1 C) (Oral)   Resp 18   Ht 5\' 4"  (1.626 m)   Wt 187 lb (84.8 kg)   SpO2 99%   BMI 32.10 kg/m   Gait - steady    HOSPITAL COURSE::  Following admission to the hospital, patient had a complete physical exam  and blood work up  Patient was monitored closely with suicide precaution  Patient was started on meds as ordered  Was encouraged to participate in group and other milieu activity  Patient started to feel better with this combination of treatment.  Significant progress in the symptoms since admission.    Mood better, with the score of 2/10 - bad  No AVH or paranoid thoughts  No Hopeless or worthless feeling  No active SI/HI  Appetite:  [x]  Normal  []  Increased  []  Decreased    Sleep:       [x]  Normal  []  Fair       []  Poor            Energy:    [x]  Normal  []  Increased  []  Decreased     SI []  Present  [x]  Absent  HI  [] Present  [x]  Absent   Aggression:  []  yes  []  no  Patient is  [x]  able  []  unable to CONTRACT FOR SAFETY   Medication side effects(SE):  [x]  None(Psych. Meds.) []  Other      Mental Status Examination on discharge:    Level of consciousness:  within normal limits   Appearance:  well-appearing  Behavior/Motor:  no abnormalities noted  Attitude toward examiner:  attentive and good eye contact  Speech:  spontaneous, normal rate and normal volume   Mood: anxious  Affect:  mood congruent  Thought processes:  linear and goal directed   Thought content:  Suicidal Ideation:  denies suicidal ideation  Delusions:  no evidence of delusions  Perceptual Disturbance:  denies any perceptual disturbance  Cognition:  oriented to person, place, and time   Concentration intact  Memory intact  Insight good   Judgement fair   Fund of Knowledge adequate      ASSESSMENT:  Patient symptoms are:  []  Well controlled  [x]  Improving  []  Worsening  []  No change      Diagnosis:  Principal Problem:    Bipolar 1 disorder, depressed, severe (HCC)  Active Problems:    Cocaine abuse  Resolved Problems:    * No resolved hospital problems. *      LABS:    No results for input(s): WBC, HGB, PLT in the last 72 hours.  No results for input(s): NA, K, CL, CO2, BUN, CREATININE, GLUCOSE in the last 72 hours.  No results for input(s): BILITOT, ALKPHOS, AST, ALT in the last 72 hours.  Lab Results   Component Value Date    [facility] POSITIVE 05/14/2016    BARBSCNU Neg 05/14/2016    LABBENZ Neg 05/14/2016    LABBENZ NotDTCD 11/27/2010    OPIATESCREENURINE Neg 05/14/2016    PHENCYCLIDINESCREENURINE Neg 05/14/2016    ETOH <10 05/14/2016     Lab Results   Component Value Date    TSH 1.290 05/14/2016     Lab Results   Component Value Date    LITHIUM 0.2 (L) 08/04/2015     No results found for: VALPROATE, CBMZ    RISK ASSESSMENT AT DISCHARGE: Low risk for suicide and homicide.     Treatment Plan:  Reviewed current Medications with the patient. Education provided on the complaince with treatment.    Risks, benefits, side effects,  drug-to-drug interactions and alternatives to treatment were discussed.    Encourage patient to attend outpatient follow up appointment and therapy.    Patient was advised to call the outpatient provider, visit the nearest ED or call 911 if symptoms are not  manageable.     Patient's family member was contacted prior to the discharge.        Medication List      START taking these medications    * QUEtiapine 100 MG tablet  Commonly known as:  SEROQUEL  Take 1 tablet by mouth nightly     * QUEtiapine 25 MG tablet  Commonly known as:  SEROQUEL  Take 1 tablet by mouth 2 times daily at 0800 and 1400        * This list has 2 medication(s) that are the same as other medications prescribed for you. Read the directions carefully, and ask your doctor or other care provider to review them with you.            CHANGE how you take these medications    melatonin 1 MG tablet  Take 5 tablets by mouth nightly  What changed:  Another medication with the same name was removed. Continue taking this medication, and follow the directions you see here.     OLANZapine 10 MG tablet  Commonly known as:  ZYPREXA  Take 1 tablet by mouth nightly  What changed:   medication strength   how much to take     tiZANidine 4 MG tablet  Commonly known as:  ZANAFLEX  What changed:  Another medication with the same name was removed. Continue taking this medication, and follow the directions you see here.        CONTINUE taking these medications    * albuterol sulfate HFA 108 (90 Base) MCG/ACT inhaler     * albuterol (2.5 MG/3ML) 0.083% nebulizer solution  Commonly known as:  PROVENTIL     aspirin 81 MG tablet     atorvastatin 80 MG tablet  Commonly known as:  LIPITOR  Take 1 tablet by mouth nightly     fluticasone 50 MCG/ACT nasal spray  Commonly known as:  FLONASE     fluticasone-salmeterol 250-50 MCG/DOSE Aepb  Commonly known as:  ADVAIR     folic acid 1 MG tablet  Commonly known as:  FOLVITE     gabapentin 600 MG tablet  Commonly known as:   NEURONTIN     hydrocortisone 0.5 % cream     ibuprofen 800 MG tablet  Commonly known as:  ADVIL;MOTRIN  Take 0.5 tablets by mouth every 8 hours as needed for Pain or Fever     ipratropium-albuterol 0.5-2.5 (3) MG/3ML Soln nebulizer solution  Commonly known as:  DUONEB     metoprolol tartrate 25 MG tablet  Commonly known as:  LOPRESSOR  Take 1 tablet by mouth 2 times daily     NITROSTAT 0.4 MG SL tablet  Generic drug:  nitroGLYCERIN     omeprazole 20 MG delayed release capsule  Commonly known as:  PRILOSEC  Take 1 capsule by mouth Daily     OXcarbazepine 150 MG tablet  Commonly known as:  TRILEPTAL  Take 1 tablet by mouth 2 times daily     RA VITAMIN B-6 50 MG tablet  Generic drug:  pyridoxine     RA VITAMIN D-3 2000 units Caps  Generic drug:  Cholecalciferol     topiramate 100 MG tablet  Commonly known as:  TOPAMAX        * This list has 2 medication(s) that are the same as other medications prescribed for you. Read the directions carefully, and ask your doctor or other care provider to review them with you.  STOP taking these medications    BANOPHEN 50 MG capsule  Generic drug:  diphenhydrAMINE     LATUDA 80 MG Tabs tablet  Generic drug:  lurasidone     pentazocine-naloxone 50-0.5 MG per tablet  Commonly known as:  TALWIN NX     prazosin 5 MG capsule  Commonly known as:  MINIPRESS     simvastatin 40 MG tablet  Commonly known as:  ZOCOR        ASK your doctor about these medications    warfarin 5 MG tablet  Commonly known as:  COUMADIN  Take as directed by [facility] Anticoagulation Management Service. Quantity equals 30 day supply.           Where to Get Your Medications      These medications were sent to [id number] - [place], [place] - [address] - P [phone] Carmon Ginsberg [phone]  [address], [place] [address]    Phone:  [phone]    OLANZapine 10 MG tablet   omeprazole 20 MG delayed release capsule   OXcarbazepine 150 MG tablet   QUEtiapine 100 MG  tablet   QUEtiapine 25 MG tablet         Reason for more than one antipsychotic:  []  N/A  [x]  3 failed monotherapy(drugs tried): seroquel, geodon, zyprexa  []  Cross over to a new antipsychotic  []  Taper to monotherapy from polypharmacy  []  Augmentation of Clozapine therapy due to treatment resistance to single therapy    Component Results     Component Value Ref Range & Units Status Collected Lab   Cholesterol, Total 146  0 - 199 mg/dL Final 61/95/0932 6:71 AM MH - [facility] Lab   ATP III Cholesterol classification is Desirable.   Triglycerides 81  0 - 200 mg/dL Final 24/58/0998 3:38 AM MH - [facility] Lab   ATP III Triglycerides Classification is Normal.   HDL 56  40 - 59 mg/dL Final 25/06/3974 7:34 AM MH - [facility] Lab   ATP III HDL Cholesterol Classification is Desirable.   Expected Values:     Males:  >55 = No Risk        35-55 = Moderate Risk        <35 = High Risk     Females: >65 = No Risk        45-65 = Moderate Risk        <45 = High Risk     NCEP Guidelines: Third Report May 2001   >59 = negative risk factor for CHD   <40 = major risk factor for CHD    LDL Calculated 74  0 - 129 mg/dL Final 19/37/9024 0:97 AM MH - [facility] Lab   ATP III LDL Classification is Optimal.             TIME SPEND - 35 MINUTES TO COMPLETE THE EVALUATION, DISCHARGE SUMMARY, MEDICATION RECONCILIATION AND FOLLOW UP CARE     SignedGillermina Hu  05/20/2016  9:42 AM

## 2016-05-20 NOTE — Progress Notes (Signed)
INPATIENT PROGRESS NOTE    SERVICE DATE:  05/20/2016   SERVICE TIME:  11:58 AM      SUBJECTIVE    INTERVAL HPI: Pt has no new complaints.  Discharge planned for today.  This Clinical research associate spoke with patient face to face along with Dr. Bufford Buttner regarding importance of compliance with coumadin regimen.  Pt states she has given herself lovenox injections in the past.  Nursing staff has been advised to instruct and witness pt self injection.  This Clinical research associate attempted to notify pt's "aide" per pt's request.  Message was left for requested call back.  This Clinical research associate will attempt to make contact prior to patient's discharge.      MEDICATIONS:    Current Facility-Administered Medications   Medication Dose Route Frequency Provider Last Rate Last Dose   ??? warfarin (COUMADIN) tablet 10 mg  10 mg Oral Once Philipp Ovens, PA-C       ??? warfarin (COUMADIN) daily dosing Leisure centre manager)   Other RX Placeholder Tiffany T Manson Passey, PA-C       ??? fluticasone (FLONASE) 50 MCG/ACT nasal spray 1 spray  1 spray Each Nare Daily Tiffany T Manson Passey, PA-C       ??? enoxaparin (LOVENOX) injection 80 mg  1 mg/kg Subcutaneous BID Philipp Ovens, PA-C   80 mg at 05/20/16 1000   ??? magnesium hydroxide (MILK OF MAGNESIA) 400 MG/5ML suspension 30 mL  30 mL Oral Daily PRN Philipp Ovens, PA-C   30 mL at 05/18/16 2235   ??? docusate sodium (COLACE) capsule 100 mg  100 mg Oral BID Philipp Ovens, PA-C   100 mg at 05/20/16 1003   ??? QUEtiapine (SEROQUEL) tablet 25 mg  25 mg Oral BID- 8&2 Desma Paganini, MD   25 mg at 05/20/16 1002   ??? QUEtiapine (SEROQUEL) tablet 100 mg  100 mg Oral Nightly Desma Paganini, MD   100 mg at 05/19/16 2046   ??? topiramate (TOPAMAX) tablet 25 mg  25 mg Oral BID Desma Paganini, MD   25 mg at 05/20/16 1005   ??? OLANZapine (ZYPREXA) tablet 10 mg  10 mg Oral Nightly Desma Paganini, MD   10 mg at 05/19/16 2046   ??? nicotine polacrilex (NICORETTE) gum 4 mg  4 mg Oral PRN Gillermina Hu, MD   4 mg at 05/19/16 1726   ??? albuterol sulfate HFA 108 (90 Base) MCG/ACT  inhaler 2 puff  2 puff Inhalation Q4H PRN Desma Paganini, MD   2 puff at 05/19/16 2053   ??? aspirin chewable tablet 81 mg  81 mg Oral Daily Desma Paganini, MD   81 mg at 05/20/16 1001   ??? atorvastatin (LIPITOR) tablet 80 mg  80 mg Oral Nightly Desma Paganini, MD   80 mg at 05/19/16 2046   ??? mometasone-formoterol (DULERA) 200-5 MCG/ACT inhaler 2 puff  2 puff Inhalation BID Desma Paganini, MD   2 puff at 05/20/16 0723   ??? folic acid (FOLVITE) tablet 1 mg  1 mg Oral Daily Desma Paganini, MD   1 mg at 05/20/16 1002   ??? ibuprofen (ADVIL;MOTRIN) tablet 400 mg  400 mg Oral Q8H PRN Desma Paganini, MD   400 mg at 05/20/16 1002   ??? melatonin tablet 5 mg  5 mg Oral Nightly Desma Paganini, MD   5 mg at 05/19/16 2046   ??? metoprolol tartrate (LOPRESSOR) tablet 25 mg  25 mg Oral BID Desma Paganini, MD   25 mg at 05/20/16 1003   ??? nitroGLYCERIN (NITROSTAT)  SL tablet 0.4 mg  0.4 mg Sublingual Q5 Min PRN Desma Paganini, MD       ??? pantoprazole (PROTONIX) tablet 40 mg  40 mg Oral QAM AC Desma Paganini, MD   40 mg at 05/20/16 0602   ??? OXcarbazepine (TRILEPTAL) tablet 150 mg  150 mg Oral BID Desma Paganini, MD   150 mg at 05/20/16 1006   ??? vitamin B-6 (PYRIDOXINE) tablet 100 mg  100 mg Oral Daily Desma Paganini, MD   100 mg at 05/20/16 1006   ??? tiZANidine (ZANAFLEX) tablet 4 mg  4 mg Oral TID Desma Paganini, MD   4 mg at 05/20/16 1006   ??? gabapentin (NEURONTIN) capsule 800 mg  800 mg Oral Nightly Desma Paganini, MD   800 mg at 05/19/16 2046   ??? hydrOXYzine (VISTARIL) capsule 50 mg  50 mg Oral Q6H PRN Desma Paganini, MD   50 mg at 05/17/16 2124    Or   ??? hydrOXYzine (VISTARIL) injection 50 mg  50 mg Intramuscular Q6H PRN Desma Paganini, MD       ??? haloperidol (HALDOL) tablet 5 mg  5 mg Oral Q6H PRN Desma Paganini, MD        Or   ??? haloperidol lactate (HALDOL) injection 5 mg  5 mg Intramuscular Q6H PRN Desma Paganini, MD       ??? vitamin B-1 (THIAMINE) tablet 100 mg  100 mg Oral Daily Desma Paganini, MD   100 mg at 05/20/16 1002   ???  multivitamin 1 tablet  1 tablet Oral Daily Desma Paganini, MD   1 tablet at 05/20/16 1003   ??? acetaminophen (TYLENOL) tablet 650 mg  650 mg Oral Q4H PRN Desma Paganini, MD   650 mg at 05/16/16 2111   ??? benztropine mesylate (COGENTIN) injection 2 mg  2 mg Intramuscular BID PRN Desma Paganini, MD       ??? aluminum & magnesium hydroxide-simethicone (MAALOX) 200-200-20 MG/5ML suspension 15 mL  15 mL Oral PRN Desma Paganini, MD           OBJECTIVE  PHYSICAL EXAM:   BP 139/85    Pulse 83    Temp 97 ??F (36.1 ??C) (Oral)    Resp 18    Ht  (1.626 m)    Wt 187 lb (84.8 kg)    SpO2 99%    BMI 32.10 kg/m??   Body mass index is 32.1 kg/m??.  CONSTITUTIONAL:  alert and no apparent distress  EYES:  pupils equal, round and reactive to light, sclera clear and conjunctiva normal  ENT:  normocepalic, without obvious abnormality, atraumatic  NECK:  supple, symmetrical, trachea midline and skin normal  LUNGS:  no increased work of breathing and clear to auscultation  CARDIOVASCULAR:  normal apical pulses and normal S1 and S2  ABDOMEN:  normal bowel sounds and non-tender  MUSCULOSKELETAL:  there is no redness, warmth, or swelling of the joints  full range of motion noted  NEUROLOGIC:  Mental Status Exam:  Level of Alertness:   awake  Orientation:   person, place, time  SKIN:  normal skin color, texture, turgor    DATA:     Recent Results (from the past 24 hour(s))   Protime-INR    Collection Time: 05/20/16  7:14 AM   Result Value Ref Range    Protime 13.2 8.1 - 13.7 sec    INR 1.3    Lipid Panel    Collection Time: 05/20/16  7:14 AM   Result Value  Ref Range    Cholesterol, Total 146 0 - 199 mg/dL    Triglycerides 81 0 - 200 mg/dL    HDL 56 40 - 59 mg/dL    LDL Calculated 74 0 - 129 mg/dL       ASSESSMENT AND PLAN   1.  Recurrent DVT/PEs  2.  Subtherapeutic INR  3.  Medication non-compliance  4.  History MI  5.  HTN  6.  HLD  7.  COPD  8.  History CHF  9.  CAD      Pt medically stable for discharge  Pt to f/u w/new PCP (Dr. Cliffton Asters, per  pt) in 1 week  Pt to f/u w/coumadin clinic in am  Nursing to instruct/witness self-injection prior to discharge  Rx for refills and new coumadin and lovenox Rx sent to pharmacy electronically  All instructions have been verbalized and will be provided in writing at discharge      SIGNATURE: Philipp Ovens, PA-C PATIENT NAME: Alisha Mccall   DATE: May 20, 2016 MRN: [id number]   TIME: 11:58 AM PAGER: [phone]     Dr. Bufford Buttner, MD, Supervising Physician

## 2016-05-20 NOTE — Progress Notes (Signed)
Pt was instructed in how to inject the lovenox injection.  Patient shows this nurse how to do the injection and close the needle safely and dispose in hazard bin or coffee can. Electronically signed by Ventura Sellers, LPN on 02/21/1094 at 12:36 PM

## 2016-05-20 NOTE — Discharge Instructions (Signed)
As tolerated

## 2016-05-20 NOTE — Discharge Instructions (Signed)
Keep all follow up appointments, take medications as ordered, utilize positive supports, abstain from use of alcohol and drugs. If symptoms return or you feel at risk to yourself or others, please call 911, return the nearest emergency room, or call your local crisis hotline:  [place]: [phone]) [phone]  [place]: [phone]  [place]: [phone]

## 2016-05-20 NOTE — Progress Notes (Signed)
Affect and mood stable  Patient verbalized understanding of all instructions  That were reviewed with her  Patient reports understanding of appointment at coumadin    for Monday. Aware that this was first available appointment at the clinic and that it was extremely important that she attend.  Belongings returned to patient   D/c with friend for transport home

## 2016-05-20 NOTE — Progress Notes (Signed)
Clinical Pharmacy Note    Warfarin consult follow-up    Recent Labs      05/20/16   0714   INR  1.3     No results for input(s): HGB, HCT, PLT in the last 72 hours.    Significant drug:drug interactions:  Current warfarin drug-drug interactions: atorvastatin, trazodone    Notes:  Date INR Warfarin Dose   05/18/16 1.1 5 mg   05/19/16 (consult received) 1.1 10 mg   05/20/16 1.3  10 mg                                        INR is subtherapeutic at 1.3 goal is 2-3. Will order warfarin 10 mg PO ONCE for today. Patient is likely being discharged today, recommend she continue warfarin 5 mg PO daily at home after receiving booster dose. Daily PT/INR until stable within therapeutic range.    Thank you,  Karlene Lineman. Quincy Sheehan, PharmD  PGY-1 Pharmacy Resident  05/20/2016 11:06 AM

## 2016-05-20 NOTE — Care Coordination-Inpatient (Signed)
Group Therapy Note    Date: 05/20/2016  Start Time: 1120  End Time:  1200  Number of Participants: 4    Type of Group: Psychotherapy    Wellness Binder Information  Module Name:  x  Session Number:  x    Patient's Goal: to go home    Notes: patient stated she has a lot to do    Status After Intervention:  Improved    Participation Level: Interactive    Participation Quality: Appropriate      Speech:  normal      Thought Process/Content: Logical      Affective Functioning: Congruent      Mood: good      Level of consciousness:  Alert      Response to Learning: Able to verbalize current knowledge/experience      Endings: None Reported    Modes of Intervention: Support      Discipline Responsible: Social Worker/Counselor   Electronically signed by Melven Sartorius, LPCC on 05/20/2016 at 12:17 PM      Signature:  Melven Sartorius, [facility]

## 2016-05-20 NOTE — Discharge Instructions (Signed)
???   Good nutrition is important when healing from an illness, injury, or surgery.  Follow any nutrition recommendations given to you during your hospital stay.   ??? If you were given an oral nutrition supplement while in the hospital, continue to take this supplement at home.  You can take it with meals, in-between meals, and/or before bedtime. These supplements can be purchased at most local grocery stores, pharmacies, and chain super-stores.   ??? If you have any questions about your diet or nutrition, call the hospital and ask for the dietitian.   as at home

## 2016-05-20 NOTE — Progress Notes (Signed)
Group Therapy Note    Date: 05/20/2016  Start Time: 1000  End Time:  1045  Number of Participants: 6    Type of Group: Psychoeducation    Wellness Binder Information  Module Name:    Session Number:      Patient's Goal:  "to feel better"    Notes:  Pt. attended the skill group.    Status After Intervention:  Improved    Participation Level: Active Listener and Interactive    Participation Quality: Appropriate, Attentive and Sharing      Speech:  normal      Thought Process/Content: Logical      Affective Functioning: Congruent      Mood: elevated      Level of consciousness:  Alert, Oriented x4 and Attentive      Response to Learning: Progressing to goal      Endings: None Reported    Modes of Intervention: Education, Support, Socialization and Activity      Discipline Responsible: Psychoeducational Specialist      Signature:  Mliss Sax, [employer]

## 2016-05-25 ENCOUNTER — Inpatient Hospital Stay: Admit: 2016-05-25 | Discharge: 2016-05-25 | Payer: PRIVATE HEALTH INSURANCE | Primary: Family Medicine

## 2016-05-25 DIAGNOSIS — Z5181 Encounter for therapeutic drug level monitoring: Secondary | ICD-10-CM

## 2016-05-25 LAB — PROTIME-INR
INR: 3.5
Protime: 42.2 seconds

## 2016-05-25 NOTE — Progress Notes (Signed)
Oral Anticoagulation Patient Education    Counseling provided to: Alisha Mccall  Anticoagulant: Warfarin  daily  Handouts provided to patient include: medication, medications that increase risk of bleeding, nosebleeds    Counseling points included:  1. Indication for anticoagulation: PE  2. Explanation of medication  3. Education on A. Fib and stroke; PE/DVT  4. Missed doses and timing of medication (contact healthcare provider if missed multiple doses)  5. Side effects: bruising and bleeding with emphasis on   6. Food or drug interactions that can increase risk of bleeding  7. OTC pain relief options: Tylenol vs NSAIDs  8. Contact provider if:   A. Changes made to medications   B. Uncontrolled bleeding/ Signs and symptoms of recurrent VTE   C. Procedures   D. Trauma and/or fall    E. Pregnancy (for women of child bearing age)   Patient stated she has been on warfarin for about 10 years.  Patient stated she was tired and was not interested in initial warfarin counseling.      INR  3.5 is supratherapetuic for this patient (goal range 2-3) and is reflective of 35 mg TWD  Patient verifies current dosing regimen, patient able to verbally recall dose  Patient reports no  missed doses since last INR   Patient denies s/sx clotting and/or stroke  Patient denies hematuria, epistaxis, rectal bleeding  Patient denies changes in diet, alcohol, or tobacco use  Reviewed medication list and drug allergies with patient, updated any medication additions or modifications accordingly  Patient also denies any pending medical or dental procedures scheduled at this time  Patient was instructed to decrease warfarin dose to  TWD  and RTC 11 days.

## 2016-06-05 ENCOUNTER — Inpatient Hospital Stay
Admit: 2016-06-05 | Discharge: 2016-06-05 | Payer: PRIVATE HEALTH INSURANCE | Attending: Pharmacist | Primary: Family Medicine

## 2016-06-05 LAB — PROTIME-INR
INR: 3.8
Protime: 45.4 seconds

## 2016-06-05 NOTE — Progress Notes (Signed)
Ms. Alisha Mccall is a 51 y.o. y/o female with history of PE who presents today for anticoagulation monitoring and adjustment.  INR 3.8 is supra-therapeutic for this patient (goal range 2-3) and is reflective of 30 mg TWD  Patient verifies current dosing regimen, patient able to verbally recall dose  Patient reports no missed doses since last INR   Patient denies s/sx clotting and/or stroke  Patient denies hematuria, epistaxis, rectal bleeding  Patient denies changes in diet, alcohol, or tobacco use  Reviewed medication list and drug allergies with patient, updated any medication additions or modifications accordingly  Patient also denies any pending medical or dental procedures scheduled at this time  Patient was instructed to hold today's dose then begin reduced dose of  27.5 mg TWD (a 8.3% reduction) and RTC 3 weeks  Patient smokes, COPD

## 2016-06-26 ENCOUNTER — Encounter (HOSPITAL_COMMUNITY): Payer: Self-pay | Admitting: *Deleted

## 2016-06-26 ENCOUNTER — Emergency Department (HOSPITAL_COMMUNITY)
Admission: EM | Admit: 2016-06-26 | Discharge: 2016-06-26 | Disposition: A | Discharge status: Home / Self Care | Payer: Self-pay | Attending: Dermatology | Admitting: Dermatology

## 2016-06-26 ENCOUNTER — Encounter: Payer: PRIVATE HEALTH INSURANCE | Primary: Family Medicine

## 2016-06-26 DIAGNOSIS — Z5321 Procedure and treatment not carried out due to patient leaving prior to being seen by health care provider: Secondary | ICD-10-CM | POA: Insufficient documentation

## 2016-06-26 DIAGNOSIS — M549 Dorsalgia, unspecified: Secondary | ICD-10-CM | POA: Insufficient documentation

## 2016-06-26 HISTORY — DX: Essential (primary) hypertension: I10

## 2016-06-26 NOTE — ED Triage Notes (Signed)
The pt is c/o back pain for 10 years  She has chronic pain

## 2016-06-26 NOTE — ED Notes (Signed)
Pts daughter presented herself to the emergency department looking for pt for pickup. Pt informed me that she was not willing to wait. Pt entered daughters car and drove away.

## 2016-06-28 ENCOUNTER — Encounter (HOSPITAL_COMMUNITY): Payer: Self-pay

## 2016-06-28 ENCOUNTER — Emergency Department (HOSPITAL_COMMUNITY): Payer: Medicaid - Out of State

## 2016-06-28 ENCOUNTER — Emergency Department (HOSPITAL_COMMUNITY)
Admission: EM | Admit: 2016-06-28 | Discharge: 2016-06-28 | Disposition: A | Discharge status: Home / Self Care | Payer: Medicaid - Out of State | Attending: Emergency Medicine | Admitting: Emergency Medicine

## 2016-06-28 DIAGNOSIS — R791 Abnormal coagulation profile: Secondary | ICD-10-CM | POA: Insufficient documentation

## 2016-06-28 DIAGNOSIS — I110 Hypertensive heart disease with heart failure: Secondary | ICD-10-CM | POA: Diagnosis not present

## 2016-06-28 DIAGNOSIS — J441 Chronic obstructive pulmonary disease with (acute) exacerbation: Secondary | ICD-10-CM | POA: Diagnosis not present

## 2016-06-28 DIAGNOSIS — Z87891 Personal history of nicotine dependence: Secondary | ICD-10-CM | POA: Diagnosis not present

## 2016-06-28 DIAGNOSIS — Z7901 Long term (current) use of anticoagulants: Secondary | ICD-10-CM

## 2016-06-28 DIAGNOSIS — Z5181 Encounter for therapeutic drug level monitoring: Secondary | ICD-10-CM

## 2016-06-28 DIAGNOSIS — I509 Heart failure, unspecified: Secondary | ICD-10-CM | POA: Insufficient documentation

## 2016-06-28 DIAGNOSIS — R0602 Shortness of breath: Secondary | ICD-10-CM | POA: Diagnosis present

## 2016-06-28 HISTORY — DX: Heart failure, unspecified: I50.9

## 2016-06-28 HISTORY — DX: Bipolar disorder, unspecified: F31.9

## 2016-06-28 HISTORY — DX: Personal history of other diseases of the musculoskeletal system and connective tissue: Z87.39

## 2016-06-28 HISTORY — DX: Other pulmonary embolism without acute cor pulmonale: I26.99

## 2016-06-28 HISTORY — DX: Fibromyalgia: M79.7

## 2016-06-28 HISTORY — DX: Chronic obstructive pulmonary disease, unspecified: J44.9

## 2016-06-28 LAB — CBC WITH DIFFERENTIAL/PLATELET
Basophils Absolute: 0 10*3/uL (ref 0.0–0.1)
Basophils Relative: 0 %
Eosinophils Absolute: 0.1 10*3/uL (ref 0.0–0.7)
Eosinophils Relative: 3 %
HCT: 35.2 % — ABNORMAL LOW (ref 36.0–46.0)
Hemoglobin: 11.6 g/dL — ABNORMAL LOW (ref 12.0–15.0)
Lymphocytes Relative: 45 %
Lymphs Abs: 2.2 10*3/uL (ref 0.7–4.0)
MCH: 31 pg (ref 26.0–34.0)
MCHC: 33 g/dL (ref 30.0–36.0)
MCV: 94.1 fL (ref 78.0–100.0)
Monocytes Absolute: 0.5 10*3/uL (ref 0.1–1.0)
Monocytes Relative: 12 %
Neutro Abs: 1.9 10*3/uL (ref 1.7–7.7)
Neutrophils Relative %: 40 %
Platelets: 223 10*3/uL (ref 150–400)
RBC: 3.74 MIL/uL — ABNORMAL LOW (ref 3.87–5.11)
RDW: 14.9 % (ref 11.5–15.5)
WBC: 4.7 10*3/uL (ref 4.0–10.5)

## 2016-06-28 LAB — BRAIN NATRIURETIC PEPTIDE: B Natriuretic Peptide: 75.5 pg/mL (ref 0.0–100.0)

## 2016-06-28 LAB — COMPREHENSIVE METABOLIC PANEL
ALT: 10 U/L — ABNORMAL LOW (ref 14–54)
AST: 24 U/L (ref 15–41)
Albumin: 3.5 g/dL (ref 3.5–5.0)
Alkaline Phosphatase: 63 U/L (ref 38–126)
Anion gap: 6 (ref 5–15)
BUN: 18 mg/dL (ref 6–20)
CO2: 20 mmol/L — ABNORMAL LOW (ref 22–32)
Calcium: 8.8 mg/dL — ABNORMAL LOW (ref 8.9–10.3)
Chloride: 116 mmol/L — ABNORMAL HIGH (ref 101–111)
Creatinine, Ser: 1.02 mg/dL — ABNORMAL HIGH (ref 0.44–1.00)
GFR calc Af Amer: >60 mL/min (ref >60)
GFR calc non Af Amer: >60 mL/min (ref >60)
Glucose, Bld: 88 mg/dL (ref 65–99)
Potassium: 4.2 mmol/L (ref 3.5–5.1)
Sodium: 142 mmol/L (ref 135–145)
Total Bilirubin: 0.5 mg/dL (ref 0.3–1.2)
Total Protein: 6.3 g/dL — ABNORMAL LOW (ref 6.5–8.1)

## 2016-06-28 LAB — PROTIME-INR
INR: 1.23
Prothrombin Time: 15.6 seconds — ABNORMAL HIGH (ref 11.4–15.2)

## 2016-06-28 LAB — I-STAT TROPONIN, ED: Troponin i, poc: 0 ng/mL (ref 0.00–0.08)

## 2016-06-28 MED ORDER — OXYCODONE-ACETAMINOPHEN 5-325 MG PO TABS: 1.0000 | ORAL_TABLET | Freq: Four times a day (QID) | ORAL | 0 refills | Status: DC | PRN

## 2016-06-28 MED ORDER — ALBUTEROL SULFATE HFA 108 (90 BASE) MCG/ACT IN AERS: 1.0000 | INHALATION_SPRAY | Freq: Four times a day (QID) | RESPIRATORY_TRACT | 0 refills | Status: DC | PRN

## 2016-06-28 MED ORDER — OXYCODONE-ACETAMINOPHEN 5-325 MG PO TABS
1.0000 | ORAL_TABLET | Freq: Once | ORAL | Status: AC
  Administered 2016-06-28: 1 via ORAL
  Filled 2016-06-28: qty 1

## 2016-06-28 MED ORDER — AZITHROMYCIN 250 MG PO TABS: 250.0000 mg | ORAL_TABLET | Freq: Every day | ORAL | 0 refills | Status: DC

## 2016-06-28 NOTE — ED Notes (Signed)
Pt requested to wait in the waiting room for her cane and shower chair to be delivered by medical equipment. Pt provided with d/c papers. Pt is ambulatory w/o difficulty and A&O

## 2016-06-28 NOTE — ED Triage Notes (Addendum)
Pt c/o generalized chronic pain and generalized swelling x 10 years.  Pain score 10/10.  Pt has not taken anything for pain.  Sts she had a "pain doctor in [place]."  Pt is moving to this area soon.     Pt reports she takes Warfarin d/t Hx of PE.  She is asking to have her INR checked.

## 2016-06-28 NOTE — Progress Notes (Signed)
CSW [facility] and [facility] that patient requested shelter list. CSW spoke with patient at bedside. Patient reported that she recently moved with her daughter who is pressuring her to quit smoking. Patient reported that she is trying but it's hard, CSW validated patient's feelings and positively affirmed patient's motivation/efforts to quit smoking. CSW encouraged to patient to continue trying. Patient reported that she may need the shelter list if she is unable to continue residing with her daughter, CSW provided patient with shelter list.   Celso SickleKimberly Cejay Cambre, LCSWA [facility]  Clinical Social Worker [phone]

## 2016-06-28 NOTE — Discharge Instructions (Signed)
Take zpack as prescribed.  Use albuterol as needed.   Take your coumadin as prescribed. Your INR is low today since you miss some doses.   See your doctor next week and repeat INR when you see your doctor.   Return to ER if you have worse shortness of breath, fever, vomiting.

## 2016-06-28 NOTE — Care Management Note (Signed)
Case Management Note  Patient Details  Name: Amy Rivers MRN: [id number] Date of Birth: 06/19/1965  Subjective/Objective:      pain              Action/Plan: Discharge Planning: NCM spoke to pt and states she is currently living with daughter. She recently moved from [place] and left most of her DME in [place]. Pt requesting cane and tub bench for home. Contacted [facility] DME rep for equipment to be delivered to room prior to dc. States [place] [facility] did ship send her medications. Pt states she will received her SS disability check in 2 weeks and was interested in getting her own apt. Wanted information on shelters. Contacted CSW with referral. Pt states she had contacted [facility] on [address] to establish care. [facility] and [facility] to call on Monday if she is not able to establish care with [facility].    Expected Discharge Date:  06/28/2016               Expected Discharge Plan:  Home/Self Care  In-House Referral:  Clinical Social Work  Discharge planning Services  CM Consult, [facility]  Post Acute Care Choice:  NA Choice offered to:  NA  DME Arranged:  Johann Capersane, Tub bench DME Agency:  [facility]  HH Arranged:  NA HH Agency:  NA  Status of Service:  Completed, signed off  If discussed at Long Length of Stay Meetings, dates discussed:    Additional Comments:  Amy Rivers, Amy Duce Ellen, RN 06/28/2016, 10:13 AM
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
## 2016-06-29 ENCOUNTER — Telehealth

## 2016-06-29 NOTE — Telephone Encounter (Signed)
1st overdue call  Reset next due INR to reflect when 2nd overdue call to be made

## 2016-06-29 NOTE — Progress Notes (Signed)
1st overdue call  Patient missed appointment on 06/26/16.  left VM for patient to call and reschedule.  Reset Next Due INR for when 2nd overdue call to be made.

## 2016-07-06 ENCOUNTER — Telehealth

## 2016-07-06 NOTE — Telephone Encounter (Signed)
Patient no call/no show for 06/26/16 appt -- called and reached patient, rescheduled for 07/14/16 as patient out of town until 07/11/16     Elby ShowersErin E. Dyanara Cozza, PharmD   07/06/2016 3:43 PM

## 2016-07-16 ENCOUNTER — Encounter: Payer: PRIVATE HEALTH INSURANCE | Primary: Family Medicine

## 2016-07-17 ENCOUNTER — Encounter

## 2016-07-17 NOTE — Telephone Encounter (Signed)
First overdue letter sent  Updated tracker to date that reflects when patient should be contacted next

## 2016-07-30 DIAGNOSIS — Z6832 Body mass index (BMI) 32.0-32.9, adult: Secondary | ICD-10-CM | POA: Diagnosis not present

## 2016-07-30 DIAGNOSIS — J441 Chronic obstructive pulmonary disease with (acute) exacerbation: Secondary | ICD-10-CM | POA: Diagnosis not present

## 2016-07-31 ENCOUNTER — Telehealth

## 2016-07-31 NOTE — Telephone Encounter (Signed)
Patient no showed due to out of town.  Patient no showed rescheduled appt  Called and left VM for patient to call and reschedule  Reset Nest Due INR to reflect when 1st overdue letter to be sent

## 2016-07-31 NOTE — Progress Notes (Signed)
Reset next due INR to reflect when 1st Overdue letter to be sent

## 2016-08-07 ENCOUNTER — Encounter

## 2016-08-07 NOTE — Telephone Encounter (Signed)
Patient did have appt on 5/31 but cancelled, was sent 1st letter on 6/1, was also called one week later. Sending another 1st letter today. Send 2nd letter on 7/6

## 2016-08-21 ENCOUNTER — Encounter

## 2016-08-21 NOTE — Telephone Encounter (Signed)
Second overdue letter sent.  Updated tracker to date that reflects when patient should be contacted next

## 2016-08-24 DIAGNOSIS — Z6831 Body mass index (BMI) 31.0-31.9, adult: Secondary | ICD-10-CM | POA: Diagnosis not present

## 2016-08-24 DIAGNOSIS — N390 Urinary tract infection, site not specified: Secondary | ICD-10-CM | POA: Diagnosis not present

## 2016-08-24 DIAGNOSIS — R300 Dysuria: Secondary | ICD-10-CM | POA: Diagnosis not present

## 2016-09-04 ENCOUNTER — Ambulatory Visit: Primary: Family Medicine

## 2016-09-04 DIAGNOSIS — I2602 Saddle embolus of pulmonary artery with acute cor pulmonale: Secondary | ICD-10-CM

## 2016-09-04 NOTE — Progress Notes (Signed)
Patient discharged for non-compliance     Missed appointment: 06/26/16  First call: 06/29/16  Second call: 07/11/16  First letter: 07/17/16  Second letter: 08/21/16  Discharge letter: 09/04/16     Copy of discharge letter faxed to [facility] (Dr. Ethelene Halamos)     Elby ShowersErin E. Jannely Henthorn, PharmD   09/04/2016 3:21 PM

## 2016-09-24 DIAGNOSIS — I10 Essential (primary) hypertension: Secondary | ICD-10-CM | POA: Diagnosis not present

## 2016-09-24 DIAGNOSIS — K219 Gastro-esophageal reflux disease without esophagitis: Secondary | ICD-10-CM | POA: Diagnosis not present

## 2016-09-24 DIAGNOSIS — E669 Obesity, unspecified: Secondary | ICD-10-CM | POA: Diagnosis not present

## 2016-09-24 DIAGNOSIS — J309 Allergic rhinitis, unspecified: Secondary | ICD-10-CM | POA: Diagnosis not present

## 2016-09-24 DIAGNOSIS — H538 Other visual disturbances: Secondary | ICD-10-CM | POA: Diagnosis not present

## 2016-09-24 DIAGNOSIS — I2510 Atherosclerotic heart disease of native coronary artery without angina pectoris: Secondary | ICD-10-CM | POA: Diagnosis not present

## 2016-09-24 DIAGNOSIS — E785 Hyperlipidemia, unspecified: Secondary | ICD-10-CM | POA: Diagnosis not present

## 2016-09-24 DIAGNOSIS — Z01118 Encounter for examination of ears and hearing with other abnormal findings: Secondary | ICD-10-CM | POA: Diagnosis not present

## 2016-09-24 DIAGNOSIS — Z87891 Personal history of nicotine dependence: Secondary | ICD-10-CM | POA: Diagnosis not present

## 2016-09-24 DIAGNOSIS — Z131 Encounter for screening for diabetes mellitus: Secondary | ICD-10-CM | POA: Diagnosis not present

## 2016-09-24 DIAGNOSIS — Z5181 Encounter for therapeutic drug level monitoring: Secondary | ICD-10-CM | POA: Diagnosis not present

## 2016-09-24 DIAGNOSIS — I119 Hypertensive heart disease without heart failure: Secondary | ICD-10-CM | POA: Diagnosis not present

## 2016-09-25 ENCOUNTER — Other Ambulatory Visit: Payer: Self-pay | Admitting: Internal Medicine

## 2016-09-25 DIAGNOSIS — Z113 Encounter for screening for infections with a predominantly sexual mode of transmission: Secondary | ICD-10-CM | POA: Diagnosis not present

## 2016-09-25 DIAGNOSIS — I119 Hypertensive heart disease without heart failure: Secondary | ICD-10-CM | POA: Diagnosis not present

## 2016-09-25 DIAGNOSIS — K219 Gastro-esophageal reflux disease without esophagitis: Secondary | ICD-10-CM | POA: Diagnosis not present

## 2016-09-25 DIAGNOSIS — Z131 Encounter for screening for diabetes mellitus: Secondary | ICD-10-CM | POA: Diagnosis not present

## 2016-09-25 DIAGNOSIS — Z87891 Personal history of nicotine dependence: Secondary | ICD-10-CM | POA: Diagnosis not present

## 2016-09-25 DIAGNOSIS — E785 Hyperlipidemia, unspecified: Secondary | ICD-10-CM | POA: Diagnosis not present

## 2016-09-25 DIAGNOSIS — Z0000 Encounter for general adult medical examination without abnormal findings: Secondary | ICD-10-CM | POA: Diagnosis not present

## 2016-09-25 DIAGNOSIS — Z5181 Encounter for therapeutic drug level monitoring: Secondary | ICD-10-CM | POA: Diagnosis not present

## 2016-09-25 DIAGNOSIS — Z01118 Encounter for examination of ears and hearing with other abnormal findings: Secondary | ICD-10-CM | POA: Diagnosis not present

## 2016-09-25 DIAGNOSIS — R9389 Abnormal findings on diagnostic imaging of other specified body structures: Secondary | ICD-10-CM

## 2016-09-25 DIAGNOSIS — I2510 Atherosclerotic heart disease of native coronary artery without angina pectoris: Secondary | ICD-10-CM | POA: Diagnosis not present

## 2016-09-25 DIAGNOSIS — H538 Other visual disturbances: Secondary | ICD-10-CM | POA: Diagnosis not present

## 2016-09-25 DIAGNOSIS — I10 Essential (primary) hypertension: Secondary | ICD-10-CM | POA: Diagnosis not present

## 2016-09-25 DIAGNOSIS — J309 Allergic rhinitis, unspecified: Secondary | ICD-10-CM | POA: Diagnosis not present

## 2016-09-25 DIAGNOSIS — E669 Obesity, unspecified: Secondary | ICD-10-CM | POA: Diagnosis not present

## 2016-09-30 ENCOUNTER — Ambulatory Visit
Admission: RE | Admit: 2016-09-30 | Discharge: 2016-09-30 | Disposition: A | Discharge status: Home / Self Care | Payer: Medicare Other | Source: Ambulatory Visit | Attending: Internal Medicine | Admitting: Internal Medicine

## 2016-09-30 DIAGNOSIS — R9389 Abnormal findings on diagnostic imaging of other specified body structures: Secondary | ICD-10-CM

## 2016-09-30 DIAGNOSIS — R911 Solitary pulmonary nodule: Secondary | ICD-10-CM | POA: Diagnosis not present

## 2016-10-05 ENCOUNTER — Encounter (HOSPITAL_COMMUNITY): Payer: Self-pay | Admitting: Emergency Medicine

## 2016-10-05 ENCOUNTER — Emergency Department (HOSPITAL_COMMUNITY): Payer: Medicare Other

## 2016-10-05 ENCOUNTER — Emergency Department (HOSPITAL_COMMUNITY)
Admission: EM | Admit: 2016-10-05 | Discharge: 2016-10-05 | Disposition: A | Discharge status: Home / Self Care | Payer: Medicare Other | Attending: Emergency Medicine | Admitting: Emergency Medicine

## 2016-10-05 DIAGNOSIS — Z885 Allergy status to narcotic agent status: Secondary | ICD-10-CM | POA: Diagnosis not present

## 2016-10-05 DIAGNOSIS — Z7901 Long term (current) use of anticoagulants: Secondary | ICD-10-CM | POA: Diagnosis not present

## 2016-10-05 DIAGNOSIS — R091 Pleurisy: Secondary | ICD-10-CM | POA: Insufficient documentation

## 2016-10-05 DIAGNOSIS — J309 Allergic rhinitis, unspecified: Secondary | ICD-10-CM | POA: Diagnosis not present

## 2016-10-05 DIAGNOSIS — I509 Heart failure, unspecified: Secondary | ICD-10-CM | POA: Diagnosis not present

## 2016-10-05 DIAGNOSIS — E669 Obesity, unspecified: Secondary | ICD-10-CM | POA: Diagnosis not present

## 2016-10-05 DIAGNOSIS — I119 Hypertensive heart disease without heart failure: Secondary | ICD-10-CM | POA: Diagnosis not present

## 2016-10-05 DIAGNOSIS — R071 Chest pain on breathing: Secondary | ICD-10-CM | POA: Diagnosis not present

## 2016-10-05 DIAGNOSIS — Z87891 Personal history of nicotine dependence: Secondary | ICD-10-CM | POA: Insufficient documentation

## 2016-10-05 DIAGNOSIS — E785 Hyperlipidemia, unspecified: Secondary | ICD-10-CM | POA: Diagnosis not present

## 2016-10-05 DIAGNOSIS — Z79899 Other long term (current) drug therapy: Secondary | ICD-10-CM | POA: Diagnosis not present

## 2016-10-05 DIAGNOSIS — I110 Hypertensive heart disease with heart failure: Secondary | ICD-10-CM | POA: Diagnosis not present

## 2016-10-05 DIAGNOSIS — J449 Chronic obstructive pulmonary disease, unspecified: Secondary | ICD-10-CM | POA: Diagnosis not present

## 2016-10-05 DIAGNOSIS — R079 Chest pain, unspecified: Secondary | ICD-10-CM | POA: Diagnosis not present

## 2016-10-05 DIAGNOSIS — R0602 Shortness of breath: Secondary | ICD-10-CM | POA: Diagnosis not present

## 2016-10-05 DIAGNOSIS — I2510 Atherosclerotic heart disease of native coronary artery without angina pectoris: Secondary | ICD-10-CM | POA: Diagnosis not present

## 2016-10-05 LAB — CBC
HCT: 35.6 % — ABNORMAL LOW (ref 36.0–46.0)
Hemoglobin: 11.7 g/dL — ABNORMAL LOW (ref 12.0–15.0)
MCH: 30.1 pg (ref 26.0–34.0)
MCHC: 32.9 g/dL (ref 30.0–36.0)
MCV: 91.5 fL (ref 78.0–100.0)
Platelets: 305 10*3/uL (ref 150–400)
RBC: 3.89 MIL/uL (ref 3.87–5.11)
RDW: 13.4 % (ref 11.5–15.5)
WBC: 5.2 10*3/uL (ref 4.0–10.5)

## 2016-10-05 LAB — I-STAT TROPONIN, ED: Troponin i, poc: 0.02 ng/mL (ref 0.00–0.08)

## 2016-10-05 LAB — BASIC METABOLIC PANEL
Anion gap: 9 (ref 5–15)
BUN: 6 mg/dL (ref 6–20)
CO2: 26 mmol/L (ref 22–32)
Calcium: 8.9 mg/dL (ref 8.9–10.3)
Chloride: 105 mmol/L (ref 101–111)
Creatinine, Ser: 1.06 mg/dL — ABNORMAL HIGH (ref 0.44–1.00)
GFR calc Af Amer: >60 mL/min (ref >60)
GFR calc non Af Amer: 60 mL/min — ABNORMAL LOW (ref >60)
Glucose, Bld: 87 mg/dL (ref 65–99)
Potassium: 4.7 mmol/L (ref 3.5–5.1)
Sodium: 140 mmol/L (ref 135–145)

## 2016-10-05 LAB — PROTIME-INR
INR: 1.89
Prothrombin Time: 22 seconds — ABNORMAL HIGH (ref 11.4–15.2)

## 2016-10-05 MED ORDER — IBUPROFEN 800 MG PO TABS: 800.0000 mg | ORAL_TABLET | Freq: Three times a day (TID) | ORAL | 0 refills | Status: DC

## 2016-10-05 MED ORDER — FENTANYL CITRATE (PF) 100 MCG/2ML IJ SOLN
50.0000 ug | Freq: Once | INTRAMUSCULAR | Status: AC
  Administered 2016-10-05: 50 ug via INTRAVENOUS
  Filled 2016-10-05: qty 2

## 2016-10-05 NOTE — ED Provider Notes (Signed)
The pt has had sharp chest pain - today - mild SOB b/c she can't take a deep breath No coughing, no fevers, no swelling of the legs  On exam there is normal lungs and heart sounds. There is no edema  INR is near therapeutic - CXR and labs reassuring Pt given reassurance that this is likely pleurisy.   EKG Interpretation  Date/Time:  Monday October 05 2016 18:37:19 EDT Ventricular Rate:  62 PR Interval:    QRS Duration: 92 QT Interval:  408 QTC Calculation: 415 R Axis:   43 Text Interpretation:  Sinus rhythm Abnormal R-wave progression, early transition Minimal ST elevation, anterior leads since last tracing no significant change Confirmed by Eber Hong ([id number]) on 10/05/2016 6:43:26 PM      Results for orders placed or performed during the hospital encounter of 10/05/16  Basic metabolic panel  Result Value Ref Range   Sodium 140 135 - 145 mmol/L   Potassium 4.7 3.5 - 5.1 mmol/L   Chloride 105 101 - 111 mmol/L   CO2 26 22 - 32 mmol/L   Glucose, Bld 87 65 - 99 mg/dL   BUN 6 6 - 20 mg/dL   Creatinine, Ser 4.16 (H) 0.44 - 1.00 mg/dL   Calcium 8.9 8.9 - 38.4 mg/dL   GFR calc non Af Amer 60 (L) >60 mL/min   GFR calc Af Amer >60 >60 mL/min   Anion gap 9 5 - 15  CBC  Result Value Ref Range   WBC 5.2 4.0 - 10.5 K/uL   RBC 3.89 3.87 - 5.11 MIL/uL   Hemoglobin 11.7 (L) 12.0 - 15.0 g/dL   HCT 53.6 (L) 46.8 - 03.2 %   MCV 91.5 78.0 - 100.0 fL   MCH 30.1 26.0 - 34.0 pg   MCHC 32.9 30.0 - 36.0 g/dL   RDW 12.2 48.2 - 50.0 %   Platelets 305 150 - 400 K/uL  Protime-INR  Result Value Ref Range   Prothrombin Time 22.0 (H) 11.4 - 15.2 seconds   INR 1.89   I-stat troponin, ED  Result Value Ref Range   Troponin i, poc 0.02 0.00 - 0.08 ng/mL   Comment 3            Medical screening examination/treatment/procedure(s) were conducted as a shared visit with non-physician practitioner(s) and myself.  I personally evaluated the patient during the encounter.  Clinical Impression:    Final diagnoses:  Pleurisy  Chest pain, unspecified type         Eber Hong, MD 10/06/16 1240

## 2016-10-05 NOTE — Discharge Instructions (Signed)
Your workup is reassuring. Labs, EKG and chest x-ray are all within normal limits. Your INR is at a therapeutic level making a blood clot unlikely. This pleuritic chest pain can be treated with ibuprofen, take as prescribed for pain. Follow up with your family doctor in the next few days to monitor for improvement. Return to ED if persistent chest pain or new or concerning symptoms develop.

## 2016-10-05 NOTE — ED Provider Notes (Signed)
[facility]-EMERGENCY DEPT Provider Note   CSN: [id number] Arrival date & time: 10/05/16  1833     History   Chief Complaint Chief Complaint  Patient presents with  . Chest Pain    HPI  Amy Rivers is a 51 y.o. Female with a history of multiple blood clots currently on warfarin, and MI, who presents with chest pain. She was seen today and urgent care and sent here for further evaluation. She reports the chest pain started yesterday at 8 PM, and has been constant in nature and kept her from sleeping last night. Pain is located on left chest, she says she feels it in her back and is able to point to a specific location, she reports pain sharpens with deep breaths.  She had Nitro and aspirin at home last night with no relief. She reports this pain feels like previous symptoms with a blood clot, and not her anginal equivalent, which was right arm and neck pain and headache. Currently taking warfarin and denies missing any doses. Denies exogenous estrogen use, lower extremity pain or swelling, recent travel or immobilization, cough or hemoptysis. Denies headache, nausea, vomiting, arm or neck pain, or weakness.       Past Medical History:  Diagnosis Date  . Bipolar 1 disorder (HCC)   . CHF (congestive heart failure) (HCC)   . COPD (chronic obstructive pulmonary disease) (HCC)   . Fibromyalgia   . H/O degenerative disc disease   . Hypertension   . Pulmonary embolism (HCC)     There are no active problems to display for this patient.   Past Surgical History:  Procedure Laterality Date  . ABDOMINAL HYSTERECTOMY    . CARDIAC SURGERY      OB History    No data available       Home Medications    Prior to Admission medications   Medication Sig Start Date End Date Taking? Authorizing Provider  albuterol (PROVENTIL HFA;VENTOLIN HFA) 108 (90 Base) MCG/ACT inhaler Inhale 1-2 puffs into the lungs every 6 (six) hours as needed for wheezing or shortness of breath. 06/28/16   Charlynne Pander, MD  azithromycin (ZITHROMAX) 250 MG tablet Take 1 tablet (250 mg total) by mouth daily. Take first 2 tablets together, then 1 every day until finished. 06/28/16   Charlynne Pander, MD  oxyCODONE-acetaminophen (PERCOCET) 5-325 MG tablet Take 1 tablet by mouth every 6 (six) hours as needed. 06/28/16   Charlynne Pander, MD    Family History History reviewed. No pertinent family history.  Social History Social History  Substance Use Topics  . Smoking status: Former Games developer  . Smokeless tobacco: Never Used  . Alcohol use No     Allergies   Cymbalta [duloxetine hcl]; Keflet [cephalexin]; and Morphine and related   Review of Systems Review of Systems  Constitutional: Negative for activity change, appetite change, chills, diaphoresis, fatigue and fever.  HENT: Negative for congestion, ear pain, rhinorrhea, sinus pain, sore throat and trouble swallowing.   Eyes: Negative for photophobia and visual disturbance.  Respiratory: Positive for chest tightness and shortness of breath. Negative for cough.   Cardiovascular: Positive for chest pain. Negative for palpitations and leg swelling.  Gastrointestinal: Negative for abdominal pain, blood in stool, diarrhea, nausea and vomiting.  Genitourinary: Negative for difficulty urinating and dysuria.  Musculoskeletal: Negative for back pain and myalgias.  Skin: Negative for color change, rash and wound.  Neurological: Negative for dizziness, weakness, light-headedness, numbness and headaches.     Physical  Exam Updated Vital Signs BP (!) 150/114   Pulse 63   Temp 97.7 F (36.5 C) (Oral)   Resp 16   Ht 5' 4.5" (1.638 m)   Wt 88.5 kg (195 lb)   SpO2 100%   BMI 32.95 kg/m   Physical Exam  Constitutional: She is oriented to person, place, and time. She appears well-developed and well-nourished. No distress.  HENT:  Head: Normocephalic and atraumatic.  Eyes: Pupils are equal, round, and reactive to light. EOM are normal. Right  eye exhibits no discharge. Left eye exhibits no discharge.  Neck: Neck supple. No tracheal deviation present.  Cardiovascular: Normal rate, regular rhythm, normal heart sounds and intact distal pulses.   Pulmonary/Chest: Effort normal and breath sounds normal. No stridor. No respiratory distress. She has no wheezes.  Abdominal: Soft. Bowel sounds are normal. She exhibits no mass. There is no tenderness. There is no guarding.  Musculoskeletal: She exhibits no edema, tenderness or deformity.  No unilateral calf swelling or tenderness  Lymphadenopathy:    She has no cervical adenopathy.  Neurological: She is alert and oriented to person, place, and time. Coordination normal.  Speech is clear, able to follow commands CN III-XII intact Normal strength in upper and lower extremities bilaterally including dorsiflexion and plantar flexion, strong and equal grip strength Sensation normal to light and sharp touch Moves extremities without ataxia, coordination intact   Skin: Skin is warm and dry. Capillary refill takes less than 2 seconds. No rash noted. She is not diaphoretic. No erythema. No pallor.  Psychiatric: She has a normal mood and affect. Her behavior is normal.  Nursing note and vitals reviewed.    ED Treatments / Results  Labs (all labs ordered are listed, but only abnormal results are displayed) Labs Reviewed  BASIC METABOLIC PANEL - Abnormal; Notable for the following:       Result Value   Creatinine, Ser 1.06 (*)    GFR calc non Af Amer 60 (*)    All other components within normal limits  CBC - Abnormal; Notable for the following:    Hemoglobin 11.7 (*)    HCT 35.6 (*)    All other components within normal limits  PROTIME-INR - Abnormal; Notable for the following:    Prothrombin Time 22.0 (*)    All other components within normal limits  I-STAT TROPONIN, ED    EKG  EKG Interpretation  Date/Time:  Monday October 05 2016 18:37:19 EDT Ventricular Rate:  62 PR Interval:     QRS Duration: 92 QT Interval:  408 QTC Calculation: 415 R Axis:   43 Text Interpretation:  Sinus rhythm Abnormal R-wave progression, early transition Minimal ST elevation, anterior leads since last tracing no significant change Confirmed by Eber Hong ([id number]) on 10/05/2016 6:43:26 PM       Radiology Dg Chest 2 View  Result Date: 10/05/2016 CLINICAL DATA:  Left-sided chest pain.  Shortness of breath. EXAM: CHEST  2 VIEW COMPARISON:  Jun 28, 2016 FINDINGS: Stable cardiomegaly. The hila and mediastinum are normal. No pneumothorax. No nodules or masses. No focal infiltrates. IMPRESSION: No active cardiopulmonary disease. Electronically Signed   By: Gerome Sam III M.D   On: 10/05/2016 20:18    Procedures Procedures (including critical care time)  Medications Ordered in ED Medications  fentaNYL (SUBLIMAZE) injection 50 mcg (50 mcg Intravenous Given 10/05/16 2044)     Initial Impression / Assessment and Plan / ED Course  I have reviewed the triage vital signs and the nursing  notes.  Pertinent labs & imaging results that were available during my care of the patient were reviewed by me and considered in my medical decision making (see chart for details).  Patient presents with pleuritic chest pain. Labs, EKG and CXR are within normal limits, ACS unlikely. INR 1.89 no lower extremity swelling PE unlikely, no imaging needed. CXR shows no focal infiltrates suggesting infection. Likely pleurisy, will treat with ibuprofen. Discharge home with follow up with PCP, return precautions provided.  Final Clinical Impressions(s) / ED Diagnoses   Final diagnoses:  Pleurisy  Chest pain, unspecified type    New Prescriptions New Prescriptions   IBUPROFEN (ADVIL,MOTRIN) 800 MG TABLET    Take 1 tablet (800 mg total) by mouth 3 (three) times daily.          Dartha Lodge, PA-C 10/06/16 Estrella Myrtle    Eber Hong, MD 10/06/16 [phone]

## 2016-10-05 NOTE — ED Triage Notes (Signed)
Per EMS: Pt c/o left sided CP with mild SOB.  Symptoms began last night around 8pm.  Pt took 3 nitro last evening with no relief.  Pt went to urgent care this am.  Pt had 2 nitro and Toradol at urgent care.  Pt states she takes coumadin at home. PTA vitals 138/98, HR 75, R 24, O2 99% on room air.  Ems administered 4mg  Zofran odt.

## 2016-10-05 NOTE — ED Notes (Signed)
Dr. Hyacinth Meeker at bedside discussing d/c instructions with patient. Pt is to call daughter for ride

## 2016-10-19 ENCOUNTER — Encounter (HOSPITAL_COMMUNITY): Payer: Self-pay | Admitting: *Deleted

## 2016-10-19 ENCOUNTER — Emergency Department (HOSPITAL_COMMUNITY)
Admission: EM | Admit: 2016-10-19 | Discharge: 2016-10-20 | Disposition: A | Discharge status: Home / Self Care | Payer: Medicare Other | Attending: Emergency Medicine | Admitting: Emergency Medicine

## 2016-10-19 DIAGNOSIS — M545 Low back pain, unspecified: Secondary | ICD-10-CM

## 2016-10-19 DIAGNOSIS — M79604 Pain in right leg: Secondary | ICD-10-CM | POA: Diagnosis not present

## 2016-10-19 DIAGNOSIS — R52 Pain, unspecified: Secondary | ICD-10-CM | POA: Diagnosis present

## 2016-10-19 DIAGNOSIS — I110 Hypertensive heart disease with heart failure: Secondary | ICD-10-CM | POA: Diagnosis not present

## 2016-10-19 DIAGNOSIS — M79605 Pain in left leg: Secondary | ICD-10-CM | POA: Diagnosis not present

## 2016-10-19 DIAGNOSIS — I509 Heart failure, unspecified: Secondary | ICD-10-CM | POA: Insufficient documentation

## 2016-10-19 DIAGNOSIS — Z7982 Long term (current) use of aspirin: Secondary | ICD-10-CM | POA: Diagnosis not present

## 2016-10-19 DIAGNOSIS — Z7901 Long term (current) use of anticoagulants: Secondary | ICD-10-CM | POA: Diagnosis not present

## 2016-10-19 DIAGNOSIS — J449 Chronic obstructive pulmonary disease, unspecified: Secondary | ICD-10-CM | POA: Diagnosis not present

## 2016-10-19 DIAGNOSIS — Z87891 Personal history of nicotine dependence: Secondary | ICD-10-CM | POA: Diagnosis not present

## 2016-10-19 MED ORDER — HYDROCODONE-ACETAMINOPHEN 5-325 MG PO TABS
2.0000 | ORAL_TABLET | Freq: Once | ORAL | Status: AC
  Administered 2016-10-20: 2 via ORAL
  Filled 2016-10-19: qty 2

## 2016-10-19 MED ORDER — KETOROLAC TROMETHAMINE 60 MG/2ML IM SOLN
60.0000 mg | Freq: Once | INTRAMUSCULAR | Status: AC
  Administered 2016-10-20: 60 mg via INTRAMUSCULAR
  Filled 2016-10-19: qty 2

## 2016-10-19 NOTE — ED Triage Notes (Signed)
Pt states she went swimming with grandchildren this afternoon. After getting out increased pain in lower back, hips and knees. Pt has history of arthritis and degentrative disease.

## 2016-10-19 NOTE — ED Notes (Signed)
ED Provider at bedside. 

## 2016-10-19 NOTE — ED Triage Notes (Signed)
Pt has not taken anything for pain, has been followed at pain clinic in [place].

## 2016-10-19 NOTE — ED Provider Notes (Addendum)
[facility]-EMERGENCY DEPT Provider Note   CSN: [id number] Arrival date & time: 10/19/16  2047     History   Chief Complaint Chief Complaint  Patient presents with  . "Pain all Over"    HPI Amy Rivers is a 51 y.o. female.  Patient is a 51 year old female past medical history of bipolar, fibromyalgia, hypertension, and pulmonary embolism. She presents today for evaluation of pain in her back and in both legs. This started today she was swimming with her grandchildren. She denies any specific injury or trauma. She denies any weakness of the legs or bowel or bladder complaints.  He has a history of chronic pain and recently relocated here from [place]. She is in the process of obtaining primary care and pain management here and [facility].   The history is provided by the patient.    Past Medical History:  Diagnosis Date  . Bipolar 1 disorder (HCC)   . CHF (congestive heart failure) (HCC)   . COPD (chronic obstructive pulmonary disease) (HCC)   . Fibromyalgia   . H/O degenerative disc disease   . Hypertension   . Pulmonary embolism (HCC)     There are no active problems to display for this patient.   Past Surgical History:  Procedure Laterality Date  . ABDOMINAL HYSTERECTOMY    . CARDIAC SURGERY      OB History    No data available       Home Medications    Prior to Admission medications   Medication Sig Start Date End Date Taking? Authorizing Provider  albuterol (PROVENTIL HFA;VENTOLIN HFA) 108 (90 Base) MCG/ACT inhaler Inhale 1-2 puffs into the lungs every 6 (six) hours as needed for wheezing or shortness of breath. 06/28/16   Charlynne Pander, MD  aspirin 81 MG tablet Take 81 mg by mouth at bedtime.     [provider]  atorvastatin (LIPITOR) 80 MG tablet Take 80 mg by mouth at bedtime. 10/03/16   [provider]  azithromycin (ZITHROMAX) 250 MG tablet Take 1 tablet (250 mg total) by mouth daily. Take first 2 tablets together, then 1 every day  until finished. Patient not taking: Reported on 10/05/2016 06/28/16   Charlynne Pander, MD  Cholecalciferol (RA VITAMIN D-3) 2000 units CAPS Take 2,000 Units by mouth daily. 03/24/16   [provider]  Cyanocobalamin (VITAMIN B-12 PO) Take 1 tablet by mouth daily.    [provider]  fluticasone (FLONASE) 50 MCG/ACT nasal spray Place 2 sprays into both nostrils daily. 03/24/16   [provider]  Fluticasone-Salmeterol (ADVAIR) 250-50 MCG/DOSE AEPB Inhale 1 puff into the lungs 2 (two) times daily. 05/20/16   [provider]  folic acid (FOLVITE) 1 MG tablet Take 1 mg by mouth daily. 04/30/13   [provider]  gabapentin (NEURONTIN) 600 MG tablet Take 600 mg by mouth 3 (three) times daily. 10/12/11   [provider]  hydrocortisone cream 0.5 % Apply 1 application topically daily as needed for itching. 03/25/16   [provider]  ibuprofen (ADVIL,MOTRIN) 800 MG tablet Take 1 tablet (800 mg total) by mouth 3 (three) times daily. 10/05/16   Dartha Lodge, PA-C  ipratropium-albuterol (DUONEB) 0.5-2.5 (3) MG/3ML SOLN Take 3 mLs by nebulization See admin instructions. TWO TO FOUR TIMES A DAY AS NEEDED FOR SHORTNESS OF BREATH OR WHEEZING 02/17/13   [provider]  metoprolol tartrate (LOPRESSOR) 25 MG tablet Take 25 mg by mouth 2 (two) times daily. 05/20/16   [provider]  nitroGLYCERIN (NITROSTAT) 0.4 MG SL tablet Place 0.4 mg under the tongue every 5 (five) minutes x 3 doses as needed for chest pain. 05/20/16   [provider]  OLANZapine (ZYPREXA) 10 MG tablet Take 10 mg by mouth at bedtime. 05/20/16   [provider]  OXcarbazepine (TRILEPTAL) 150 MG tablet Take 150 mg by mouth 2 (two) times daily. 05/20/16   [provider]  oxyCODONE-acetaminophen (PERCOCET) 5-325 MG tablet Take 1 tablet by mouth every 6 (six) hours as needed. Patient taking differently: Take 1 tablet by mouth 3 (three) times daily as needed  (for pain).  06/28/16   Charlynne PanderYao, David Hsienta, MD  pantoprazole (PROTONIX) 40 MG tablet Take 40 mg by mouth daily before breakfast. 10/03/16   [provider]  pyridOXINE (RA VITAMIN B-6) 50 MG tablet Take 50 mg by mouth daily. 03/03/13   [provider]  QUEtiapine (SEROQUEL) 100 MG tablet Take 100 mg by mouth at bedtime. 05/20/16   [provider]  QUEtiapine (SEROQUEL) 25 MG tablet Take 50 mg by mouth daily. LATE MORNING 05/20/16   [provider]  sertraline (ZOLOFT) 50 MG tablet Take 50 mg by mouth at bedtime. 10/12/11   [provider]  tiZANidine (ZANAFLEX) 4 MG tablet Take 4 mg by mouth 3 (three) times daily. 10/12/11   [provider]  topiramate (TOPAMAX) 100 MG tablet Take 100 mg by mouth 2 (two) times daily. 10/12/11   [provider]  warfarin (COUMADIN) 5 MG tablet Take 5 mg by mouth at bedtime.     [provider]    Family History No family history on file.  Social History Social History  Substance Use Topics  . Smoking status: Former Games developermoker  . Smokeless tobacco: Never Used  . Alcohol use No     Allergies   Keflet [cephalexin]; Morphine and related; Sulfa antibiotics; Iodine; Strawberry extract; and Cymbalta [duloxetine hcl]   Review of Systems Review of Systems  All other systems reviewed and are negative.    Physical Exam Updated Vital Signs BP (!) 148/109   Pulse 95   Temp 97.6 F (36.4 C)   Resp (!) 22   Ht 5' 4.5" (1.638 m)   Wt 88.5 kg (195 lb)   SpO2 99%   BMI 32.95 kg/m   Physical Exam  Constitutional: She is oriented to person, place, and time. She appears well-developed and well-nourished. No distress.  HENT:  Head: Normocephalic and atraumatic.  Neck: Normal range of motion. Neck supple.  Cardiovascular: Normal rate and regular rhythm.  Exam reveals no gallop and no friction rub.   No murmur heard. Pulmonary/Chest: Effort normal and breath sounds normal. No respiratory distress.  She has no wheezes.  Abdominal: Soft. Bowel sounds are normal. She exhibits no distension. There is no tenderness.  Musculoskeletal: Normal range of motion.  There is tenderness to palpation in the soft tissues of the lumbar region.  Neurological: She is alert and oriented to person, place, and time.  Strength is 5 out of 5 in both lower extremities. DTRs are 1+ and symmetrical in the patellar and Achilles tendons. There are no sensory deficits.  Skin: Skin is warm and dry. She is not diaphoretic.  Nursing note and vitals reviewed.    ED Treatments / Results  Labs (all labs ordered are listed, but only abnormal results are displayed) Labs Reviewed  URINALYSIS, ROUTINE W REFLEX MICROSCOPIC    EKG  EKG Interpretation None  Radiology No results found.  Procedures Procedures (including critical care time)  Medications Ordered in ED Medications  ketorolac (TORADOL) injection 60 mg (not administered)  HYDROcodone-acetaminophen (NORCO/VICODIN) 5-325 MG per tablet 2 tablet (not administered)     Initial Impression / Assessment and Plan / ED Course  I have reviewed the triage vital signs and the nursing notes.  Pertinent labs & imaging results that were available during my care of the patient were reviewed by me and considered in my medical decision making (see chart for details).  No evidence for UTI. Her pain seems musculoskeletal in nature. She reports history of chronic pain with occasional flareups. She is new in town and has no medications. I have agreed to write a small quantity of hydrocodone, Flexeril, and she is to follow-up with her primary Dr. this week. I have reviewed the [place] narcotic database. This patient has one prescription on file from several months ago.  Final Clinical Impressions(s) / ED Diagnoses   Final diagnoses:  None    New Prescriptions New Prescriptions   No medications on file     Geoffery Lyons, MD 10/20/16 Lawson Fiscal,  MD 10/20/16 [phone]

## 2016-10-20 DIAGNOSIS — M545 Low back pain: Secondary | ICD-10-CM | POA: Diagnosis not present

## 2016-10-20 LAB — URINALYSIS, ROUTINE W REFLEX MICROSCOPIC
Bacteria, UA: NONE SEEN
Bilirubin Urine: NEGATIVE
Glucose, UA: NEGATIVE mg/dL
Hgb urine dipstick: NEGATIVE
Ketones, ur: NEGATIVE mg/dL
Nitrite: NEGATIVE
Protein, ur: 30 mg/dL — AB
Specific Gravity, Urine: 1.017 (ref 1.005–1.030)
pH: 8 (ref 5.0–8.0)

## 2016-10-20 MED ORDER — CYCLOBENZAPRINE HCL 10 MG PO TABS: 10.0000 mg | ORAL_TABLET | Freq: Three times a day (TID) | ORAL | 0 refills | Status: DC | PRN

## 2016-10-20 MED ORDER — HYDROCODONE-ACETAMINOPHEN 5-325 MG PO TABS: 1.0000 | ORAL_TABLET | Freq: Four times a day (QID) | ORAL | 0 refills | Status: DC | PRN

## 2016-10-20 NOTE — Discharge Instructions (Signed)
Ibuprofen 600 mg every 6 hours as needed for pain.  Flexeril and hydrocodone as prescribed as needed for pain not relieved with ibuprofen.  Follow up with your primary Dr. this week.

## 2016-10-23 DIAGNOSIS — F314 Bipolar disorder, current episode depressed, severe, without psychotic features: Secondary | ICD-10-CM | POA: Diagnosis not present

## 2016-10-28 DIAGNOSIS — F314 Bipolar disorder, current episode depressed, severe, without psychotic features: Secondary | ICD-10-CM | POA: Diagnosis not present

## 2016-11-03 DIAGNOSIS — M79672 Pain in left foot: Secondary | ICD-10-CM | POA: Diagnosis not present

## 2016-11-03 DIAGNOSIS — M545 Low back pain: Secondary | ICD-10-CM | POA: Diagnosis not present

## 2016-11-03 DIAGNOSIS — G894 Chronic pain syndrome: Secondary | ICD-10-CM | POA: Diagnosis not present

## 2016-11-03 DIAGNOSIS — M5137 Other intervertebral disc degeneration, lumbosacral region: Secondary | ICD-10-CM | POA: Diagnosis not present

## 2016-11-16 ENCOUNTER — Telehealth: Payer: Self-pay

## 2016-11-16 DIAGNOSIS — J309 Allergic rhinitis, unspecified: Secondary | ICD-10-CM | POA: Diagnosis not present

## 2016-11-16 DIAGNOSIS — E785 Hyperlipidemia, unspecified: Secondary | ICD-10-CM | POA: Diagnosis not present

## 2016-11-16 DIAGNOSIS — K219 Gastro-esophageal reflux disease without esophagitis: Secondary | ICD-10-CM | POA: Diagnosis not present

## 2016-11-16 DIAGNOSIS — E669 Obesity, unspecified: Secondary | ICD-10-CM | POA: Diagnosis not present

## 2016-11-16 DIAGNOSIS — I2510 Atherosclerotic heart disease of native coronary artery without angina pectoris: Secondary | ICD-10-CM | POA: Diagnosis not present

## 2016-11-16 DIAGNOSIS — Z7901 Long term (current) use of anticoagulants: Secondary | ICD-10-CM | POA: Diagnosis not present

## 2016-11-16 DIAGNOSIS — Z87891 Personal history of nicotine dependence: Secondary | ICD-10-CM | POA: Diagnosis not present

## 2016-11-16 DIAGNOSIS — I119 Hypertensive heart disease without heart failure: Secondary | ICD-10-CM | POA: Diagnosis not present

## 2016-11-16 NOTE — Telephone Encounter (Signed)
NOTES SENT TO SCHEDULING.  °

## 2016-11-17 ENCOUNTER — Encounter (INDEPENDENT_AMBULATORY_CARE_PROVIDER_SITE_OTHER): Payer: Self-pay

## 2016-11-17 ENCOUNTER — Ambulatory Visit (INDEPENDENT_AMBULATORY_CARE_PROVIDER_SITE_OTHER): Payer: Medicare Other | Admitting: Cardiology

## 2016-11-17 VITALS — BP 156/90 | HR 67 | Ht 64.0 in | Wt 199.8 lb

## 2016-11-17 DIAGNOSIS — I2510 Atherosclerotic heart disease of native coronary artery without angina pectoris: Secondary | ICD-10-CM | POA: Diagnosis not present

## 2016-11-17 DIAGNOSIS — Z86711 Personal history of pulmonary embolism: Secondary | ICD-10-CM

## 2016-11-17 DIAGNOSIS — R9431 Abnormal electrocardiogram [ECG] [EKG]: Secondary | ICD-10-CM

## 2016-11-17 DIAGNOSIS — I509 Heart failure, unspecified: Secondary | ICD-10-CM

## 2016-11-17 DIAGNOSIS — I10 Essential (primary) hypertension: Secondary | ICD-10-CM

## 2016-11-17 MED ORDER — APIXABAN 5 MG PO TABS: 5.0000 mg | ORAL_TABLET | Freq: Two times a day (BID) | ORAL | 11 refills | Status: DC

## 2016-11-17 NOTE — Patient Instructions (Signed)
Medication Instructions:  Please stop your Warfarin for 2 days then start Eliquis 5 mg one by mouth twice a day.  If you are unable to afford Eliquis you will need to restart the Warfarin and follow up with lab work to check your PT/INR. Continue all other medications as listed.  Testing/Procedures: Your physician has requested that you have an echocardiogram. Echocardiography is a painless test that uses sound waves to create images of your heart. It provides your doctor with information about the size and shape of your heart and how well your heart's chambers and valves are working. This procedure takes approximately one hour. There are no restrictions for this procedure.  Follow-Up: Follow up in 6 months with Norma Fredrickson, NP.  You will receive a letter in the mail 2 months before you are due.  Please call us when you receive this letter to schedule your follow up appointment.  If you need a refill on your cardiac medications before your next appointment, please call your pharmacy.  Thank you for choosing [facility]!!

## 2016-11-17 NOTE — Progress Notes (Signed)
Cardiology Office Note:    Date:  11/17/2016   ID:  Amy Rivers, DOB Feb 05, 1966, MRN [id number]  PCP:  Jackie Plum, MD  Cardiologist:  Donato Schultz, MD    Referring MD: Jackie Plum, MD     History of Present Illness:    Amy Rivers is a 51 y.o. 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 In review of prior office notes, she had an episode of chest discomfort that was concerning to her like a blood clot was happening again ". CT scan of chest did not show any evidence of thrombosis.  Overall she has been doing fairly well. She has some fatigue with exertion. Denies any recent anginal symptoms. She's been on Coumadin for years she states.  She also states that she has a history of congestive heart failure.   She states that her Breathing is so so. CABG  LDL 132  Tired with ambulation. No CP, no syncope.  From [place].  Tob use-continues to smoke. She was previously diagnosed with COPD but most recent exam by her primary physician was to the contrary.   Past Medical History:  Diagnosis Date  . Allergic rhinitis   . Anticoagulant long-term use   . Bipolar 1 disorder (HCC)   . CAD (coronary artery disease), native coronary artery    ANGINA PRESENCE UNSPECIFIED  . CHF (congestive heart failure) (HCC)   . COPD (chronic obstructive pulmonary disease) (HCC)   . DDD (degenerative disc disease), lumbar   . Fibromyalgia   . Fibromyalgia   . GERD (gastroesophageal reflux disease)   . H/O degenerative disc disease   . Hx of tobacco use, presenting hazards to health   . Hyperlipidemia   . Hypertension   . Low back pain   . Obesity (BMI 30.0-34.9)   . Other chronic pain   . Pulmonary embolism (HCC)   . Scoliosis     Past Surgical History:  Procedure Laterality Date  . ABDOMINAL  HYSTERECTOMY    . CARDIAC SURGERY      Current Medications: Current Meds  Medication Sig  . atorvastatin (LIPITOR) 80 MG tablet Take 80 mg by mouth at bedtime.  . hydrOXYzine (ATARAX/VISTARIL) 50 MG tablet Take 50 mg by mouth 3 (three) times daily as needed.  . metoprolol tartrate (LOPRESSOR) 25 MG tablet Take 25 mg by mouth 2 (two) times daily.  . naproxen sodium (ANAPROX) 220 MG tablet Take 220 mg by mouth 2 (two) times daily with a meal.  . nitroGLYCERIN (NITROSTAT) 0.4 MG SL tablet Place 0.4 mg under the tongue every 5 (five) minutes x 3 doses as needed for chest pain.  Marland Kitchen OLANZapine (ZYPREXA) 10 MG tablet Take 10 mg by mouth at bedtime.  . pantoprazole (PROTONIX) 40 MG tablet Take 40 mg by mouth daily before breakfast.  . [DISCONTINUED] warfarin (COUMADIN) 5 MG tablet Take 5 mg by mouth at bedtime.      Allergies:   Keflet [cephalexin]; Morphine and related; Sulfa antibiotics; Iodine; Strawberry extract; and Cymbalta [duloxetine hcl]   Social History   Social History  . Marital status: Widowed    Spouse name: N/A  . Number of children: N/A  . Years of education: N/A   Social History Main Topics  . Smoking status: Former Games developer  . Smokeless tobacco: Never Used  . Alcohol use No  .  Drug use: Unknown  . Sexual activity: Not Asked   Other Topics Concern  . None   Social History Narrative  . None     Family History: The patient's family history includes Arthritis in her father; Hepatitis in her mother. ROS:   Please see the history of present illness.    No bleeding, no syncope, no orthopnea, no PND All other systems reviewed and are negative.  EKGs/Labs/Other Studies Reviewed:    The following studies were reviewed today: Prior office, lab work, EKG reviewed  EKG:  EKG is  ordered today.  The ekg ordered today demonstrates sinus rhythm with T-wave inversion in the anterior precordial leads, cannot exclude anterior ischemia. Similar EKG described previously.  Personally viewed.  Recent Labs: 06/28/2016: ALT 10; B Natriuretic Peptide 75.5 10/05/2016: BUN 6; Creatinine, Ser 1.06; Hemoglobin 11.7; Platelets 305; Potassium 4.7; Sodium 140  Recent Lipid Panel No results found for: CHOL, TRIG, HDL, CHOLHDL, VLDL, LDLCALC, LDLDIRECT  Physical Exam:    VS:  BP (!) 156/90   Pulse 67   Ht  (1.626 m)   Wt 199 lb 12.8 oz (90.6 kg)   LMP  (LMP Unknown)   BMI 34.30 kg/m     Wt Readings from Last 3 Encounters:  11/17/16 199 lb 12.8 oz (90.6 kg)  10/19/16 195 lb (88.5 kg)  10/05/16 195 lb (88.5 kg)     GEN:  Well nourished, well developed in no acute distress HEENT: Normal NECK: No JVD; No carotid bruits LYMPHATICS: No lymphadenopathy CARDIAC: RRR, no murmurs, rubs, gallops, CABG scar noted RESPIRATORY:  Clear to auscultation without rales, wheezing or rhonchi  ABDOMEN: Soft, non-tender, non-distended MUSCULOSKELETAL:  No edema; No deformity  SKIN: Warm and dry NEUROLOGIC:  Alert and oriented x 3 PSYCHIATRIC:  Normal affect   ASSESSMENT:    1. Heart failure, unspecified HF chronicity, unspecified heart failure type (HCC)   2. History of pulmonary embolism   3. Abnormal EKG   4. Essential hypertension   5. Coronary artery disease involving native coronary artery of native heart without angina pectoris    PLAN:    In order of problems listed above:  Coronary artery disease status post CABG 1 -Continue secondary prevention. Atorvastatin, metoprolol. -Currently not having any anginal symptoms.  History of recurrent pulmonary embolism -I will change her from Coumadin to Eliquis twice a day. 5 mg. Hold Coumadin for 2 days then start Eliquis. -Creatinine 0.97, hemoglobin 12.3. If Eliquis is cost prohibitive, we will need to go back to Coumadin.  Hyperlipidemia -LDL 132-optimally would like less than 100 even less than 70. Continue with atorvastatin.  Abnormal EKG -T-wave inversions noted in the anterior precordial leads as was  seen on prior EKG. This may be an old finding for her. We will check an echocardiogram to ensure proper structure and function.  Essential hypertension -Continue to monitor.  Tobacco use -Tobacco cessation discussed  Medication Adjustments/Labs and Tests Ordered: Current medicines are reviewed at length with the patient today.  Concerns regarding medicines are outlined above.  Orders Placed This Encounter  Procedures  . EKG 12-Lead  . ECHOCARDIOGRAM COMPLETE   Meds ordered this encounter  Medications  . apixaban (ELIQUIS) 5 MG TABS tablet    Sig: Take 1 tablet (5 mg total) by mouth 2 (two) times daily.    Dispense:  60 tablet    Refill:  11    Signed, Donato Schultz, MD  11/17/2016 2:42 PM    [facility]

## 2016-11-19 DIAGNOSIS — F314 Bipolar disorder, current episode depressed, severe, without psychotic features: Secondary | ICD-10-CM | POA: Diagnosis not present

## 2016-11-26 DIAGNOSIS — G894 Chronic pain syndrome: Secondary | ICD-10-CM | POA: Diagnosis not present

## 2016-11-26 DIAGNOSIS — M5137 Other intervertebral disc degeneration, lumbosacral region: Secondary | ICD-10-CM | POA: Diagnosis not present

## 2016-11-26 DIAGNOSIS — G890 Central pain syndrome: Secondary | ICD-10-CM | POA: Diagnosis not present

## 2016-11-26 DIAGNOSIS — M79672 Pain in left foot: Secondary | ICD-10-CM | POA: Diagnosis not present

## 2016-11-26 DIAGNOSIS — M545 Low back pain: Secondary | ICD-10-CM | POA: Diagnosis not present

## 2016-11-27 ENCOUNTER — Ambulatory Visit (HOSPITAL_COMMUNITY): Payer: Medicare Other | Attending: Internal Medicine

## 2016-11-27 ENCOUNTER — Other Ambulatory Visit: Payer: Self-pay

## 2016-11-27 DIAGNOSIS — I361 Nonrheumatic tricuspid (valve) insufficiency: Secondary | ICD-10-CM | POA: Diagnosis not present

## 2016-11-27 DIAGNOSIS — M419 Scoliosis, unspecified: Secondary | ICD-10-CM | POA: Insufficient documentation

## 2016-11-27 DIAGNOSIS — E785 Hyperlipidemia, unspecified: Secondary | ICD-10-CM | POA: Diagnosis not present

## 2016-11-27 DIAGNOSIS — I10 Essential (primary) hypertension: Secondary | ICD-10-CM | POA: Insufficient documentation

## 2016-11-27 DIAGNOSIS — I509 Heart failure, unspecified: Secondary | ICD-10-CM

## 2016-11-27 DIAGNOSIS — J449 Chronic obstructive pulmonary disease, unspecified: Secondary | ICD-10-CM | POA: Insufficient documentation

## 2016-11-30 ENCOUNTER — Telehealth: Payer: Self-pay | Admitting: Pharmacist

## 2016-11-30 MED ORDER — RIVAROXABAN 20 MG PO TABS: 20.0000 mg | ORAL_TABLET | Freq: Every day | ORAL | 0 refills | Status: DC

## 2016-11-30 NOTE — Telephone Encounter (Signed)
Talked to patient and sent Rx to prefer pharmacy

## 2016-11-30 NOTE — Telephone Encounter (Signed)
I am fine with change from Eliquis to [place]  PO QD  Donato Schultz, MD'

## 2016-11-30 NOTE — Telephone Encounter (Signed)
Patient reports problems remembering twice daily dose of Apixaban (Eliquis).  She is intrested to switch to Xarelto  daily or back to warfarin but will like input from Dr Anne Fu.  *CrCl > 19ml/min and no drug-drug interactions noted with Xarelto *  Please contact patient to authorize change if agree.   Need new Rx as well.

## 2016-12-09 DIAGNOSIS — F3181 Bipolar II disorder: Secondary | ICD-10-CM | POA: Diagnosis not present

## 2016-12-10 ENCOUNTER — Emergency Department (HOSPITAL_COMMUNITY)
Admission: EM | Admit: 2016-12-10 | Discharge: 2016-12-10 | Disposition: A | Discharge status: Home / Self Care | Payer: Medicare Other | Attending: Emergency Medicine | Admitting: Emergency Medicine

## 2016-12-10 ENCOUNTER — Encounter (HOSPITAL_COMMUNITY): Payer: Self-pay

## 2016-12-10 DIAGNOSIS — I2510 Atherosclerotic heart disease of native coronary artery without angina pectoris: Secondary | ICD-10-CM | POA: Insufficient documentation

## 2016-12-10 DIAGNOSIS — I509 Heart failure, unspecified: Secondary | ICD-10-CM | POA: Diagnosis not present

## 2016-12-10 DIAGNOSIS — R040 Epistaxis: Secondary | ICD-10-CM | POA: Diagnosis not present

## 2016-12-10 DIAGNOSIS — I110 Hypertensive heart disease with heart failure: Secondary | ICD-10-CM | POA: Diagnosis not present

## 2016-12-10 DIAGNOSIS — J449 Chronic obstructive pulmonary disease, unspecified: Secondary | ICD-10-CM | POA: Insufficient documentation

## 2016-12-10 DIAGNOSIS — Z79899 Other long term (current) drug therapy: Secondary | ICD-10-CM | POA: Diagnosis not present

## 2016-12-10 DIAGNOSIS — Z87891 Personal history of nicotine dependence: Secondary | ICD-10-CM | POA: Diagnosis not present

## 2016-12-10 DIAGNOSIS — Z7901 Long term (current) use of anticoagulants: Secondary | ICD-10-CM | POA: Diagnosis not present

## 2016-12-10 MED ORDER — OXYMETAZOLINE HCL 0.05 % NA SOLN
1.0000 | Freq: Once | NASAL | Status: AC
  Administered 2016-12-10: 1 via NASAL
  Filled 2016-12-10: qty 15

## 2016-12-10 NOTE — ED Triage Notes (Signed)
Pt complains of a nosebleed twice tonight Pt takes a blood thinner She states that she was vomiting earlier from a new medication possibly Bleeding controlled at this time

## 2016-12-10 NOTE — Discharge Instructions (Signed)
If you have recurrent nosebleed: 1.  Blood your nose completely 2.  2 sprays of Afrin in which ever nostril is bleeding 3.  Apply direct pressure for at least 5-10 minutes. 4. Can apply ice packs if needed.  Do not use the afrin for more than 3 consecutive days. Follow-up with your primary care doctor. Return here for any new/worsening symptoms.

## 2016-12-10 NOTE — ED Provider Notes (Signed)
[facility]-EMERGENCY DEPT Provider Note   CSN: [id number] Arrival date & time: 12/10/16  0425     History   Chief Complaint Chief Complaint  Patient presents with  . Epistaxis    HPI Amy Rivers is a 51 y.o. female.  The history is provided by the patient and medical records.  Epistaxis      51 year old female with history of allergic rhinitis, bipolar disorder, coronary artery disease, congestive heart failure, COPD, GERD, epilepsy, hypertension, PE on chronic anticoagulation, presenting to the ED with nosebleed.  States this past evening she had 2 different nosebleeds, both resolved with supportive measures at home.  States she was previously on Coumadin for 9+ years but recently switched to Xarelto about 2 weeks ago to try to avoid INR checks.  Patient has not had any other active bleeding, specifically no blood in the stools.  No dizziness or weakness.  She has not had any trauma to the face.  No nasal congestion, sneezing, or other URI symptoms.  Past Medical History:  Diagnosis Date  . Allergic rhinitis   . Anticoagulant long-term use   . Bipolar 1 disorder (HCC)   . CAD (coronary artery disease), native coronary artery    ANGINA PRESENCE UNSPECIFIED  . CHF (congestive heart failure) (HCC)   . COPD (chronic obstructive pulmonary disease) (HCC)   . DDD (degenerative disc disease), lumbar   . Fibromyalgia   . Fibromyalgia   . GERD (gastroesophageal reflux disease)   . H/O degenerative disc disease   . Hx of tobacco use, presenting hazards to health   . Hyperlipidemia   . Hypertension   . Low back pain   . Obesity (BMI 30.0-34.9)   . Other chronic pain   . Pulmonary embolism (HCC)   . Scoliosis     There are no active problems to display for this patient.   Past Surgical History:  Procedure Laterality Date  . ABDOMINAL HYSTERECTOMY    . CARDIAC SURGERY      OB History    No data available       Home Medications    Prior to  Admission medications   Medication Sig Start Date End Date Taking? Authorizing Provider  atorvastatin (LIPITOR) 80 MG tablet Take 80 mg by mouth at bedtime. 10/03/16  Yes [provider]  buprenorphine-naloxone (SUBOXONE) 8-2 mg SUBL SL tablet Place 1 tablet under the tongue daily.   Yes [provider]  hydrOXYzine (ATARAX/VISTARIL) 50 MG tablet Take 50 mg by mouth 3 (three) times daily as needed.   Yes [provider]  ibuprofen (ADVIL,MOTRIN) 200 MG tablet Take 400 mg by mouth every 6 (six) hours as needed for moderate pain.   Yes [provider]  metoprolol tartrate (LOPRESSOR) 25 MG tablet Take 50 mg by mouth 2 (two) times daily.  05/20/16  Yes [provider]  nitroGLYCERIN (NITROSTAT) 0.4 MG SL tablet Place 0.4 mg under the tongue every 5 (five) minutes x 3 doses as needed for chest pain. 05/20/16  Yes [provider]  OLANZapine (ZYPREXA) 10 MG tablet Take 10 mg by mouth at bedtime. 05/20/16  Yes [provider]  pantoprazole (PROTONIX) 40 MG tablet Take 40 mg by mouth daily before breakfast. 10/03/16  Yes [provider]  rivaroxaban (XARELTO) 20 MG TABS tablet Take 1 tablet (20 mg total) by mouth daily with supper. Take place of Eliquis 11/30/16  Yes Jake Bathe, MD  topiramate (TOPAMAX) 25 MG tablet Take 25  mg by mouth at bedtime. 11/19/16  Yes [provider]    Family History Family History  Problem Relation Age of Onset  . Hepatitis Mother        C  . Arthritis Father     Social History Social History  Substance Use Topics  . Smoking status: Former Games developer  . Smokeless tobacco: Never Used  . Alcohol use No     Allergies   Keflet [cephalexin]; Morphine and related; Sulfa antibiotics; Iodine; Strawberry extract; and Cymbalta [duloxetine hcl]   Review of Systems Review of Systems  HENT: Positive for nosebleeds.   All other systems reviewed and are negative.    Physical Exam Updated Vital  Signs BP 113/82 (BP Location: Left Arm)   Pulse 76   Temp 98.2 F (36.8 C) (Oral)   Resp 18   LMP  (LMP Unknown)   SpO2 96%   Physical Exam  Constitutional: She is oriented to person, place, and time. She appears well-developed and well-nourished.  HENT:  Head: Normocephalic and atraumatic.  Mouth/Throat: Oropharynx is clear and moist.  No active bleeding, some dried blood noted inside medial portion of right anterior nostril; no septal deformity or hematoma; no signs of trauma to the face  Eyes: Pupils are equal, round, and reactive to light. Conjunctivae and EOM are normal.  Neck: Normal range of motion.  Cardiovascular: Normal rate, regular rhythm and normal heart sounds.   Pulmonary/Chest: Effort normal and breath sounds normal.  Abdominal: Soft. Bowel sounds are normal.  Musculoskeletal: Normal range of motion.  Neurological: She is alert and oriented to person, place, and time.  Skin: Skin is warm and dry.  Psychiatric: She has a normal mood and affect.  Nursing note and vitals reviewed.    ED Treatments / Results  Labs (all labs ordered are listed, but only abnormal results are displayed) Labs Reviewed - No data to display  EKG  EKG Interpretation None       Radiology No results found.  Procedures Procedures (including critical care time)  Medications Ordered in ED Medications  oxymetazoline (AFRIN) 0.05 % nasal spray 1 spray (not administered)     Initial Impression / Assessment and Plan / ED Course  I have reviewed the triage vital signs and the nursing notes.  Pertinent labs & imaging results that were available during my care of the patient were reviewed by me and considered in my medical decision making (see chart for details).  51 year old female here with nosebleed x2.  No active bleeding here.  She is on long-term anticoagulation for PE.  Was previously on Coumadin, now on Xarelto for the past 2 weeks.  No other bleeding episodes or blood in the  stool.  Vitals stable.  On exam, there is some dried blood inside the medial portion of the right anterior nostril.  No septal deformity or hematoma.  No signs of trauma to the face.  Patient observed here for 30+ minutes without recurrence of bleed.   Suspect her nosebleed due to anticoagulation.  We discussed no need for check of INR as xarelto is not monitored this way.  We discussed supportive measures at home for recurrent nosebleed including afrin, direct pressure, ice packs, etc.  Discussed not using afrin for more than 3 consecutive days.  Recommended close follow-up with PCP.  Discussed plan with patient, she acknowledged understanding and agreed with plan of care.  Return precautions given for new or worsening symptoms.  Final Clinical Impressions(s) / ED Diagnoses  Final diagnoses:  Epistaxis    New Prescriptions New Prescriptions   No medications on file     Garlon HatchetSanders, Melvena Vink M, PA-C 12/10/16 0605    Garlon HatchetSanders, Iya Hamed M, PA-C 12/10/16 [id number]    Nicanor AlconPalumbo, April, MD 12/10/16 [phone]

## 2016-12-23 DIAGNOSIS — E669 Obesity, unspecified: Secondary | ICD-10-CM | POA: Diagnosis not present

## 2016-12-23 DIAGNOSIS — Z7901 Long term (current) use of anticoagulants: Secondary | ICD-10-CM | POA: Diagnosis not present

## 2016-12-23 DIAGNOSIS — K219 Gastro-esophageal reflux disease without esophagitis: Secondary | ICD-10-CM | POA: Diagnosis not present

## 2016-12-23 DIAGNOSIS — Z87891 Personal history of nicotine dependence: Secondary | ICD-10-CM | POA: Diagnosis not present

## 2016-12-23 DIAGNOSIS — I119 Hypertensive heart disease without heart failure: Secondary | ICD-10-CM | POA: Diagnosis not present

## 2016-12-23 DIAGNOSIS — J309 Allergic rhinitis, unspecified: Secondary | ICD-10-CM | POA: Diagnosis not present

## 2016-12-23 DIAGNOSIS — E785 Hyperlipidemia, unspecified: Secondary | ICD-10-CM | POA: Diagnosis not present

## 2016-12-23 DIAGNOSIS — I2510 Atherosclerotic heart disease of native coronary artery without angina pectoris: Secondary | ICD-10-CM | POA: Diagnosis not present

## 2016-12-23 DIAGNOSIS — J449 Chronic obstructive pulmonary disease, unspecified: Secondary | ICD-10-CM | POA: Diagnosis not present

## 2016-12-24 DIAGNOSIS — G894 Chronic pain syndrome: Secondary | ICD-10-CM | POA: Diagnosis not present

## 2016-12-29 ENCOUNTER — Telehealth: Payer: Self-pay | Admitting: Nurse Practitioner

## 2016-12-29 NOTE — Telephone Encounter (Signed)
Will forward to Dr Skains for review and orders 

## 2016-12-29 NOTE — Telephone Encounter (Signed)
Okay to stop Xarelto and start Coumadin.  If she would like for us to follow it in our Coumadin clinic this is fine. Donato SchultzMark Consuelo Suthers, MD

## 2016-12-29 NOTE — Telephone Encounter (Signed)
New Message      Pt c/o medication issue:  1. Name of Medication: rivaroxaban (XARELTO) 20 MG TABS tablet  2. How are you currently taking this medication (dosage and times per day)?  1x a day  3. Are you having a reaction (difficulty breathing--STAT)? yes  4. What is your medication issue?  She is having nose bleeds with it , and she is not allowed to have cortisone shots while she is on this medication needs you to switch back to warafrin

## 2016-12-29 NOTE — Telephone Encounter (Signed)
Pt aware of OK to change to coumadin from Xarelto.  She does have chronic back pain and takes cortisone injections for it.  She does states understanding that she will need to hold warfarin as well prior to having back injections.  She states she wants to have our Coumadin Clinic follow her INR's.  She is aware she will be called to schedule an appt to discuss switching to coumadin.

## 2016-12-30 ENCOUNTER — Other Ambulatory Visit: Payer: Self-pay | Admitting: Cardiology

## 2016-12-30 MED ORDER — WARFARIN SODIUM 5 MG PO TABS: 5.0000 mg | ORAL_TABLET | Freq: Every day | ORAL | 1 refills | Status: DC

## 2016-12-30 NOTE — Telephone Encounter (Signed)
Discussed/reviewed process of changing from Xarelto to Coumadin with pt.  She will take both for 3 days then discontinue the Xarelto.  She was previous on 5 mg a day of Coumadin and will be started back at this dose.  Rx sent into [facility] as requested.  Scheduled for New Pt CC appt 11/26 at 10:30 am.  Pt aware, states understanding and in agreement

## 2017-01-06 DIAGNOSIS — F314 Bipolar disorder, current episode depressed, severe, without psychotic features: Secondary | ICD-10-CM | POA: Diagnosis not present

## 2017-01-11 ENCOUNTER — Telehealth: Payer: Self-pay | Admitting: Pharmacist

## 2017-01-11 ENCOUNTER — Ambulatory Visit (INDEPENDENT_AMBULATORY_CARE_PROVIDER_SITE_OTHER): Payer: Medicare Other | Admitting: *Deleted

## 2017-01-11 DIAGNOSIS — I2699 Other pulmonary embolism without acute cor pulmonale: Secondary | ICD-10-CM | POA: Diagnosis not present

## 2017-01-11 DIAGNOSIS — Z5181 Encounter for therapeutic drug level monitoring: Secondary | ICD-10-CM | POA: Diagnosis not present

## 2017-01-11 LAB — POCT INR: INR: 4.7

## 2017-01-11 NOTE — Patient Instructions (Addendum)
Do not take any Coumadin today and No Coumadin tomorrow then start taking 1 tablet daily except 1/2 tablet on Tuesdays and Saturdays. Recheck INR in 1 week.  Main [phone] Coumadin Clinic [phone]   A full discussion of the nature of anticoagulants has been carried out.  A benefit risk analysis has been presented to the patient, so that they understand the justification for choosing anticoagulation at this time. The need for frequent and regular monitoring, precise dosage adjustment and compliance is stressed.  Side effects of potential bleeding are discussed.  The patient should avoid any OTC items containing aspirin or ibuprofen, and should avoid great swings in general diet.  Avoid alcohol consumption.  Call if any signs of abnormal bleeding.  Must adhere to all appointments.

## 2017-01-11 NOTE — Telephone Encounter (Signed)
Pt seen today for new Coumadin visit after she transitioned from Xarelto to warfarin. Pt states she will be undergoing back injections with Dr Tonye RoyaltyKwadwo Gyarteng-Dakwa with the [facility] and need to be off of her Coumadin for 5 days prior. Pt takes Coumadin for recurrent PE. She has only been seen by Dr Anne FuSkains once and we do not have full medical records to show if PE occurred while pt was adherent to anticoagulation. Typically for recurrent PE, would bridge with Lovenox before and after procedure. Will route to Dr Anne FuSkains for his input regarding periprocedural anticoagulation.

## 2017-01-12 NOTE — Telephone Encounter (Signed)
Will make pt aware at next Coumadin appt that she will require bridging with Lovenox for any upcoming spinal injections.

## 2017-01-12 NOTE — Telephone Encounter (Signed)
Let us use Lovenox bridge.  In review of records it seems as though she stopped her Coumadin after her first PE episode.  So her second PE likely occurred off of anticoagulation.  She has had recurrent PEs.  Please relayed to her that this could increase her risk of bleeding.     Donato SchultzMark Skains, MD

## 2017-01-18 ENCOUNTER — Encounter (HOSPITAL_COMMUNITY): Payer: Self-pay | Admitting: *Deleted

## 2017-01-18 ENCOUNTER — Emergency Department (HOSPITAL_COMMUNITY): Payer: Medicare Other

## 2017-01-18 ENCOUNTER — Other Ambulatory Visit: Payer: Self-pay

## 2017-01-18 DIAGNOSIS — M25551 Pain in right hip: Secondary | ICD-10-CM | POA: Insufficient documentation

## 2017-01-18 DIAGNOSIS — M25552 Pain in left hip: Secondary | ICD-10-CM | POA: Diagnosis not present

## 2017-01-18 DIAGNOSIS — Z79899 Other long term (current) drug therapy: Secondary | ICD-10-CM | POA: Diagnosis not present

## 2017-01-18 DIAGNOSIS — I2510 Atherosclerotic heart disease of native coronary artery without angina pectoris: Secondary | ICD-10-CM | POA: Insufficient documentation

## 2017-01-18 DIAGNOSIS — F17210 Nicotine dependence, cigarettes, uncomplicated: Secondary | ICD-10-CM | POA: Diagnosis not present

## 2017-01-18 DIAGNOSIS — I509 Heart failure, unspecified: Secondary | ICD-10-CM | POA: Diagnosis not present

## 2017-01-18 DIAGNOSIS — I110 Hypertensive heart disease with heart failure: Secondary | ICD-10-CM | POA: Diagnosis not present

## 2017-01-18 DIAGNOSIS — J449 Chronic obstructive pulmonary disease, unspecified: Secondary | ICD-10-CM | POA: Insufficient documentation

## 2017-01-18 NOTE — ED Triage Notes (Signed)
Pt reports bil hip pain, no injury or fall, she has tried heat, ointments and OTC without relief

## 2017-01-19 ENCOUNTER — Emergency Department (HOSPITAL_COMMUNITY)
Admission: EM | Admit: 2017-01-19 | Discharge: 2017-01-19 | Disposition: A | Discharge status: Home / Self Care | Payer: Medicare Other | Attending: Emergency Medicine | Admitting: Emergency Medicine

## 2017-01-19 DIAGNOSIS — M25551 Pain in right hip: Secondary | ICD-10-CM

## 2017-01-19 DIAGNOSIS — M25552 Pain in left hip: Secondary | ICD-10-CM

## 2017-01-19 MED ORDER — PREDNISONE 10 MG PO TABS: 40.0000 mg | ORAL_TABLET | Freq: Every day | ORAL | 0 refills | Status: AC

## 2017-01-19 MED ORDER — OXYCODONE-ACETAMINOPHEN 5-325 MG PO TABS
1.0000 | ORAL_TABLET | Freq: Once | ORAL | Status: AC
  Administered 2017-01-19: 1 via ORAL
  Filled 2017-01-19: qty 1

## 2017-01-19 MED ORDER — PREDNISONE 20 MG PO TABS
60.0000 mg | ORAL_TABLET | Freq: Once | ORAL | Status: AC
  Administered 2017-01-19: 60 mg via ORAL
  Filled 2017-01-19: qty 3

## 2017-01-19 NOTE — ED Notes (Signed)
Bed: WTR7 Expected date:  Expected time:  Means of arrival:  Comments: 

## 2017-01-19 NOTE — Discharge Instructions (Signed)
Take prednisone as prescribed for the next 5 days beginning tomorrow.  You received the first dose in the emergency department today.  You may take [phone] mg of Tylenol every 6 hours as needed but do not take ibuprofen, Voltaren, Naprosyn, or Aleve during this time.  After you finish the prednisone you may start taking NSAIDs again.  Follow-up with a pain management specialist and an orthopedist or neurosurgeon for management of your chronic back pain.  Return to the ED immediately if any concerning signs or symptoms develop.

## 2017-01-19 NOTE — ED Provider Notes (Signed)
[facility]-EMERGENCY DEPT Provider Note   CSN: [id number] Arrival date & time: 01/18/17  1925     History   Chief Complaint Chief Complaint  Patient presents with  . Hip Pain    HPI Amy Rivers is a 51 y.o. female with history of COPD, CAD, degenerative disc disease, fibromyalgia, HTN, HLD, and tobacco abuse presents today with chief complaint acute onset, constant bilateral hip pain which began yesterday.  Patient states that she awoke yesterday morning with a throbbing bilateral hip pain which radiates up her lower back and down to her knees.  She states that this feels like her usual fibromyalgia flares.  She thinks that this is a result of her helping up her pregnant daughter over the past several days, but she denies any known trauma or falls.  Endorses numbness to the bilateral anterior thighs but says this is chronic and unchanged.  She has tried hot baths, hot packs and ice packs with mild relief of her symptoms.  Denies fevers, chills, bowel or bladder incontinence, or IV drug use.  She has a history of PE for which she is on Coumadin and states that she has not missed any of her doses.  She has a cardiologist that manages her warfarin and she does have a primary care physician.  She states that she has been attempting to see a pain management specialist but has been unable to get in with one.  Chart review shows note on January 11, 2017 that states that she is receiving spine injections from Dr. Tonye RoyaltyKwadwo Gyarteng-Dakwa at a pain management center. Denies headaches, CP, shortness of breath, cough, hemoptysis, abdominal pain, or vomiting.  She does endorse nausea related to her pain.  She ambulates with a cane at baseline. 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   The history is provided by the patient.    Past Medical History:  Diagnosis Date  . Allergic rhinitis   . Anticoagulant long-term use   . Bipolar 1 disorder (HCC)   . CAD (coronary  artery disease), native coronary artery    ANGINA PRESENCE UNSPECIFIED  . CHF (congestive heart failure) (HCC)   . COPD (chronic obstructive pulmonary disease) (HCC)   . DDD (degenerative disc disease), lumbar   . Fibromyalgia   . Fibromyalgia   . GERD (gastroesophageal reflux disease)   . H/O degenerative disc disease   . Hx of tobacco use, presenting hazards to health   . Hyperlipidemia   . Hypertension   . Low back pain   . Obesity (BMI 30.0-34.9)   . Other chronic pain   . Pulmonary embolism (HCC)   . Scoliosis     Patient Active Problem List   Diagnosis Date Noted  . Encounter for therapeutic drug monitoring 01/11/2017  . Pulmonary embolus (HCC) 01/11/2017    Past Surgical History:  Procedure Laterality Date  . ABDOMINAL HYSTERECTOMY    . ABDOMINAL SURGERY    . CARDIAC SURGERY      OB History    No data available       Home Medications    Prior to Admission medications   Medication Sig Start Date End Date Taking? Authorizing Provider  atorvastatin (LIPITOR) 80 MG tablet Take 80 mg by mouth at bedtime. 10/03/16   [provider]  buprenorphine-naloxone (SUBOXONE) 8-2 mg SUBL SL tablet Place 1 tablet under the tongue daily.    [provider]  diclofenac (VOLTAREN) 75 MG EC tablet Take 75 mg by mouth  2 (two) times daily.    [provider]  hydrOXYzine (ATARAX/VISTARIL) 50 MG tablet Take 50 mg by mouth 3 (three) times daily as needed.    [provider]  ibuprofen (ADVIL,MOTRIN) 200 MG tablet Take 400 mg by mouth every 6 (six) hours as needed for moderate pain.    [provider]  metoprolol tartrate (LOPRESSOR) 25 MG tablet Take 50 mg by mouth 2 (two) times daily.  05/20/16   [provider]  montelukast (SINGULAIR) 10 MG tablet Take 10 mg by mouth at bedtime.    [provider]  nitroGLYCERIN (NITROSTAT) 0.4 MG SL tablet Place 0.4 mg under the tongue every 5 (five) minutes x 3 doses as needed for chest  pain. 05/20/16   [provider]  OLANZapine (ZYPREXA) 10 MG tablet Take 10 mg by mouth at bedtime. 05/20/16   [provider]  pantoprazole (PROTONIX) 40 MG tablet Take 40 mg by mouth daily before breakfast. 10/03/16   [provider]  predniSONE (DELTASONE) 10 MG tablet Take 4 tablets (40 mg total) by mouth daily with breakfast for 5 days. 01/19/17 01/24/17  Michela Pitcher A, PA-C  topiramate (TOPAMAX) 25 MG tablet Take 25 mg by mouth at bedtime. 11/19/16   [provider]  warfarin (COUMADIN) 5 MG tablet Take 1 tablet (5 mg total) daily by mouth. 12/30/16   Jake Bathe, MD    Family History Family History  Problem Relation Age of Onset  . Hepatitis Mother        C  . Arthritis Father     Social History Social History   Tobacco Use  . Smoking status: Current Every Day Smoker  . Smokeless tobacco: Never Used  Substance Use Topics  . Alcohol use: No  . Drug use: No     Allergies   Keflet [cephalexin]; Morphine and related; Sulfa antibiotics; Iodine; Strawberry extract; and Cymbalta [duloxetine hcl]   Review of Systems Review of Systems  Constitutional: Negative for chills and fever.  Respiratory: Negative for shortness of breath.   Cardiovascular: Negative for chest pain.  Gastrointestinal: Negative for abdominal pain and vomiting.  Musculoskeletal: Positive for arthralgias, back pain and myalgias.  Neurological: Positive for numbness (chronic, unchanged).  All other systems reviewed and are negative.    Physical Exam Updated Vital Signs BP (!) 154/104 (BP Location: Right Arm)   Pulse 97   Temp 98.5 F (36.9 C) (Oral)   Resp 18   Ht 5\' 4"  (1.626 m)   Wt 88.5 kg (195 lb)   LMP  (LMP Unknown)   SpO2 98%   BMI 33.47 kg/m   Physical Exam  Constitutional: She is oriented to person, place, and time. She appears well-developed and well-nourished. No distress.  HENT:  Head: Normocephalic and atraumatic.  Eyes: Conjunctivae are normal.  Right eye exhibits no discharge. Left eye exhibits no discharge.  Neck: Normal range of motion. Neck supple. No JVD present. No tracheal deviation present.  Cardiovascular: Normal rate, regular rhythm, normal heart sounds and intact distal pulses.  2+ radial and DP/PT pulses bl, negative Homan's bl, no lower extremity edema  Pulmonary/Chest: Effort normal and breath sounds normal.  Abdominal: Soft. Bowel sounds are normal. She exhibits no distension. There is no tenderness.  Musculoskeletal: Normal range of motion. She exhibits tenderness. She exhibits no edema.  Diffuse lumbar spine and paralumbar muscle tenderness to palpation with no deformity, crepitus, or step-off noted.  No focal tenderness noted.  Pain with passive range  of motion of the bilateral hips in any direction. 4+/5 strength of bilateral hip flexors, patient states this is normal.  Otherwise 5/5 strength of BLE major muscle groups.  Ambulates with a cane.  Bilateral straight leg raise present.  No erythema or warmth noted.  Neurological: She is alert and oriented to person, place, and time. A sensory deficit is present. No cranial nerve deficit. She exhibits normal muscle tone.  Fluent speech, no facial droop, decreased sensation of the right lower extremity from the knee to toes.  Patient states this is chronic and unchanged from a stroke several years ago.  Ambulates with a cane although gait is steady but antalgic.  Able to walk on heels and toes without difficulty.  Skin: Skin is warm and dry. No erythema.  Psychiatric: She has a normal mood and affect. Her behavior is normal.  Nursing note and vitals reviewed.    ED Treatments / Results  Labs (all labs ordered are listed, but only abnormal results are displayed) Labs Reviewed - No data to display  EKG  EKG Interpretation None       Radiology Dg Hip Unilat With Pelvis 2-3 Views Left  Result Date: 01/18/2017 CLINICAL DATA:  Chronic BILATERAL anterior hip pain worse  this morning, history of osteoarthritis EXAM: DG HIP (WITH OR WITHOUT PELVIS) 2-3V LEFT COMPARISON:  None FINDINGS: Osseous mineralization low normal. LEFT hip joint space preserved. Visualized pelvis intact. No acute fracture, dislocation or bone destruction. Scattered pelvic phleboliths. IMPRESSION: No acute osseous abnormalities. Electronically Signed   By: Ulyses SouthwardMark  Boles M.D.   On: 01/18/2017 20:19   Dg Hip Unilat  With Pelvis 2-3 Views Right  Result Date: 01/18/2017 CLINICAL DATA:  Chronic BILATERAL anterior hip pain worse this morning, history of osteoarthritis EXAM: DG HIP (WITH OR WITHOUT PELVIS) 2-3V RIGHT COMPARISON:  None FINDINGS: Low normal osseous mineralization. Hip and SI joints symmetric and preserved. No acute fracture, dislocation, or bone destruction. Numerous pelvic phleboliths. IMPRESSION: No acute osseous abnormalities. Electronically Signed   By: Ulyses SouthwardMark  Boles M.D.   On: 01/18/2017 20:20    Procedures Procedures (including critical care time)  Medications Ordered in ED Medications  predniSONE (DELTASONE) tablet 60 mg (not administered)  oxyCODONE-acetaminophen (PERCOCET/ROXICET) 5-325 MG per tablet 1 tablet (not administered)     Initial Impression / Assessment and Plan / ED Course  I have reviewed the triage vital signs and the nursing notes.  Pertinent labs & imaging results that were available during my care of the patient were reviewed by me and considered in my medical decision making (see chart for details).     Patient with bilateral hip pain and back pain for 2 days.  States it feels like a her usual fibromyalgia flare.  Her neurological examination is at her baseline with no acute changes.  No red flag signs concerning for cauda equina or spinal abscess.  No history of trauma but states she has been helping her daughter who is pregnant and her activity levels have increased.  Radiographs reviewed by me show no acute osseous abnormality.  She has been compliant with  her Coumadin.  Doubt DVT or avascular necrosis.   low suspicion of septic joint given polyarticular pain, and she is able to ambulate.  No warmth or constitutional symptoms noted.  Pain was managed while in the ED, she has tolerated Percocet in the past without difficulty. Stable for discharge home with prednisone, no history of diabetes.  She will follow-up with her primary care physician  and follow-up with establishment with a pain management physician, although chart review suggests she already has one.  Discussed indications for return to the ED. Pt verbalized understanding of and agreement with plan and is safe for discharge home at this time.  She has no complaints prior to discharge.   Final Clinical Impressions(s) / ED Diagnoses   Final diagnoses:  Bilateral hip pain    ED Discharge Orders        Ordered    predniSONE (DELTASONE) 10 MG tablet  Daily with breakfast     01/19/17 0757       Jeanie Sewer, PA-C 01/19/17 1610    Jacalyn Lefevre, MD 01/19/17 [phone]

## 2017-01-19 NOTE — ED Notes (Signed)
Pt reports chronic hip and back pain. Woke up yesterday with pain to both hips. Denies injury

## 2017-01-20 ENCOUNTER — Ambulatory Visit (INDEPENDENT_AMBULATORY_CARE_PROVIDER_SITE_OTHER): Payer: Medicare Other | Admitting: *Deleted

## 2017-01-20 DIAGNOSIS — J309 Allergic rhinitis, unspecified: Secondary | ICD-10-CM | POA: Diagnosis not present

## 2017-01-20 DIAGNOSIS — Z5181 Encounter for therapeutic drug level monitoring: Secondary | ICD-10-CM | POA: Diagnosis not present

## 2017-01-20 DIAGNOSIS — I2699 Other pulmonary embolism without acute cor pulmonale: Secondary | ICD-10-CM

## 2017-01-20 DIAGNOSIS — Z7901 Long term (current) use of anticoagulants: Secondary | ICD-10-CM | POA: Diagnosis not present

## 2017-01-20 DIAGNOSIS — E785 Hyperlipidemia, unspecified: Secondary | ICD-10-CM | POA: Diagnosis not present

## 2017-01-20 DIAGNOSIS — J449 Chronic obstructive pulmonary disease, unspecified: Secondary | ICD-10-CM | POA: Diagnosis not present

## 2017-01-20 DIAGNOSIS — G894 Chronic pain syndrome: Secondary | ICD-10-CM | POA: Diagnosis not present

## 2017-01-20 DIAGNOSIS — K219 Gastro-esophageal reflux disease without esophagitis: Secondary | ICD-10-CM | POA: Diagnosis not present

## 2017-01-20 DIAGNOSIS — I2510 Atherosclerotic heart disease of native coronary artery without angina pectoris: Secondary | ICD-10-CM | POA: Diagnosis not present

## 2017-01-20 DIAGNOSIS — E669 Obesity, unspecified: Secondary | ICD-10-CM | POA: Diagnosis not present

## 2017-01-20 DIAGNOSIS — I119 Hypertensive heart disease without heart failure: Secondary | ICD-10-CM | POA: Diagnosis not present

## 2017-01-20 DIAGNOSIS — Z87891 Personal history of nicotine dependence: Secondary | ICD-10-CM | POA: Diagnosis not present

## 2017-01-20 DIAGNOSIS — R350 Frequency of micturition: Secondary | ICD-10-CM | POA: Diagnosis not present

## 2017-01-20 LAB — POCT INR: INR: 3.3

## 2017-01-20 NOTE — Patient Instructions (Signed)
Do not take any Coumadin today  Dec 5th then change coumadin dose  to  1 tablet daily except 1/2 tablet on Tuesdays thursdays  and Saturdays. Recheck INR in 1 week.  Main [phone] coumadin clinic [phone]

## 2017-01-21 ENCOUNTER — Encounter: Payer: Self-pay | Admitting: Pharmacist

## 2017-01-21 ENCOUNTER — Telehealth: Payer: Self-pay | Admitting: Pharmacist

## 2017-01-21 DIAGNOSIS — M545 Low back pain: Secondary | ICD-10-CM | POA: Diagnosis not present

## 2017-01-21 MED ORDER — ENOXAPARIN SODIUM 80 MG/0.8ML ~~LOC~~ SOLN: 80.0000 mg | Freq: Two times a day (BID) | SUBCUTANEOUS | 1 refills | Status: DC

## 2017-01-21 NOTE — Telephone Encounter (Signed)
Pt called clinic stating she will be having an ESI on 12/14. She will require Lovenox bridging due to hx of multiple PEs. Pt was just in clinic yesterday for an INR check but did not have procedure date set yet. No clinic availability until 12/10 due to the weekend which will be too late to coordinate Lovenox bridge. Pt states she has used Lovenox multiple times in the past and is comfortable performing injections on her own. Will print out Lovenox instructions separately and mail them to patient. Also encouraged pt to sign up for MyChart so her Lovenox instructions can be sent that way.

## 2017-01-21 NOTE — Telephone Encounter (Signed)
This encounter was created in error - please disregard.

## 2017-01-21 NOTE — Telephone Encounter (Signed)
Pt states she can't sign up for MyChart and is having issues with it. Will mail out Lovenox instructions to patient. Also verbalized instructions to pt and had her write them down for the first few days due to upcoming weekend and anticipated snow on Monday. Pt read back instructions appropriately.

## 2017-01-21 NOTE — Telephone Encounter (Signed)
Take usual Coumadin dose 12/6, 12/7, and 12/8. Pt weighs 88kg, will dose Lovenox 80mg  BID.  12/8: Last dose of Coumadin.  12/9: No Coumadin or Lovenox.  12/10: Inject Lovenox 80mg  in the fatty abdominal tissue at least 2 inches from the belly button twice a day about 12 hours apart, 8am and 8pm rotate sites. No Coumadin.  12/11: Inject Lovenox in the fatty tissue every 12 hours, 8am and 8pm. No Coumadin.  12/12: Inject Lovenox in the fatty tissue every 12 hours, 8am and 8pm. No Coumadin.  12/13: Inject Lovenox in the fatty tissue in the morning at 8 am (No PM dose). No Coumadin.  12/14: Procedure Day - No Lovenox - Resume Coumadin in the evening after procedure or as directed by doctor.  12/15: Resume Lovenox inject in the fatty tissue every 12 hours and take Coumadin.  12/16: Inject Lovenox in the fatty tissue every 12 hours and take Coumadin.  12/17: Inject Lovenox in the fatty tissue every 12 hours and take Coumadin.  12/18: Inject Lovenox in the fatty tissue every 12 hours and take Coumadin.  12/19: Inject Lovenox in the fatty tissue every 12 hours and take Coumadin.  12/20: Coumadin appt to check INR at 1:30PM.

## 2017-01-29 ENCOUNTER — Telehealth: Payer: Self-pay | Admitting: Cardiology

## 2017-01-29 NOTE — Telephone Encounter (Signed)
Pt called to inform us that her spinal injection has been rescheduled to 02/05/17. Thus, discussed this with pharmacist, Nicholaus BloomKelley and pt will continue Lovenox Q12 hrs and not restart Coumadin. Therefore, new bridging plan was given to pt. She will continue Lovenox Q12 hours until last dose on 02/04/17 in AM. 02/05/17-Day of procedure no lovenox, restart Coumadin that night or as directed by MD. 02/06/17- Restart Loveox Q12 hrs and continue coumadin, continue  both until f/u appt on 02/11/17.

## 2017-01-29 NOTE — Telephone Encounter (Signed)
New message     Patient wants to know about the Lovenox and a spinal injection please call

## 2017-02-05 DIAGNOSIS — M545 Low back pain: Secondary | ICD-10-CM | POA: Diagnosis not present

## 2017-02-19 ENCOUNTER — Ambulatory Visit (INDEPENDENT_AMBULATORY_CARE_PROVIDER_SITE_OTHER): Payer: Medicare Other | Admitting: Pharmacist

## 2017-02-19 DIAGNOSIS — I2699 Other pulmonary embolism without acute cor pulmonale: Secondary | ICD-10-CM | POA: Diagnosis not present

## 2017-02-19 DIAGNOSIS — Z5181 Encounter for therapeutic drug level monitoring: Secondary | ICD-10-CM

## 2017-02-19 LAB — POCT INR: INR: 2.3

## 2017-02-19 NOTE — Patient Instructions (Signed)
Description   Continue 1 tablet daily except 1/2 tablet on Tuesdays thursdays  and Saturdays. Recheck INR in 2 week.  Main [phone] [facility] [phone]

## 2017-02-25 ENCOUNTER — Other Ambulatory Visit: Payer: Self-pay | Admitting: Cardiology

## 2017-02-25 MED ORDER — NITROGLYCERIN 0.4 MG SL SUBL: 0.4000 mg | SUBLINGUAL_TABLET | SUBLINGUAL | 2 refills | Status: DC | PRN

## 2017-02-25 NOTE — Telephone Encounter (Signed)
Pt's medication was sent to pt's pharmacy as requested. Confirmation received.  °

## 2017-03-12 DIAGNOSIS — M545 Low back pain: Secondary | ICD-10-CM | POA: Diagnosis not present

## 2017-03-25 DIAGNOSIS — M545 Low back pain: Secondary | ICD-10-CM | POA: Diagnosis not present

## 2017-03-25 DIAGNOSIS — M25561 Pain in right knee: Secondary | ICD-10-CM | POA: Diagnosis not present

## 2017-03-25 DIAGNOSIS — M542 Cervicalgia: Secondary | ICD-10-CM | POA: Diagnosis not present

## 2017-03-25 DIAGNOSIS — M25511 Pain in right shoulder: Secondary | ICD-10-CM | POA: Diagnosis not present

## 2017-03-25 DIAGNOSIS — M25562 Pain in left knee: Secondary | ICD-10-CM | POA: Diagnosis not present

## 2017-03-25 DIAGNOSIS — G894 Chronic pain syndrome: Secondary | ICD-10-CM | POA: Diagnosis not present

## 2017-04-05 DIAGNOSIS — E785 Hyperlipidemia, unspecified: Secondary | ICD-10-CM | POA: Diagnosis not present

## 2017-04-05 DIAGNOSIS — K219 Gastro-esophageal reflux disease without esophagitis: Secondary | ICD-10-CM | POA: Diagnosis not present

## 2017-04-05 DIAGNOSIS — I119 Hypertensive heart disease without heart failure: Secondary | ICD-10-CM | POA: Diagnosis not present

## 2017-04-05 DIAGNOSIS — E669 Obesity, unspecified: Secondary | ICD-10-CM | POA: Diagnosis not present

## 2017-04-05 DIAGNOSIS — J309 Allergic rhinitis, unspecified: Secondary | ICD-10-CM | POA: Diagnosis not present

## 2017-04-05 DIAGNOSIS — J069 Acute upper respiratory infection, unspecified: Secondary | ICD-10-CM | POA: Diagnosis not present

## 2017-04-05 DIAGNOSIS — J449 Chronic obstructive pulmonary disease, unspecified: Secondary | ICD-10-CM | POA: Diagnosis not present

## 2017-04-05 DIAGNOSIS — I2510 Atherosclerotic heart disease of native coronary artery without angina pectoris: Secondary | ICD-10-CM | POA: Diagnosis not present

## 2017-04-05 DIAGNOSIS — G894 Chronic pain syndrome: Secondary | ICD-10-CM | POA: Diagnosis not present

## 2017-04-05 DIAGNOSIS — Z87891 Personal history of nicotine dependence: Secondary | ICD-10-CM | POA: Diagnosis not present

## 2017-04-05 DIAGNOSIS — Z7901 Long term (current) use of anticoagulants: Secondary | ICD-10-CM | POA: Diagnosis not present

## 2017-04-05 DIAGNOSIS — R350 Frequency of micturition: Secondary | ICD-10-CM | POA: Diagnosis not present

## 2017-04-07 DIAGNOSIS — F314 Bipolar disorder, current episode depressed, severe, without psychotic features: Secondary | ICD-10-CM | POA: Diagnosis not present

## 2017-04-12 DIAGNOSIS — G894 Chronic pain syndrome: Secondary | ICD-10-CM | POA: Diagnosis not present

## 2017-04-12 DIAGNOSIS — M25561 Pain in right knee: Secondary | ICD-10-CM | POA: Diagnosis not present

## 2017-04-12 DIAGNOSIS — M25562 Pain in left knee: Secondary | ICD-10-CM | POA: Diagnosis not present

## 2017-04-12 DIAGNOSIS — M545 Low back pain: Secondary | ICD-10-CM | POA: Diagnosis not present

## 2017-04-16 ENCOUNTER — Other Ambulatory Visit: Payer: Self-pay

## 2017-05-04 ENCOUNTER — Encounter (INDEPENDENT_AMBULATORY_CARE_PROVIDER_SITE_OTHER): Payer: Self-pay

## 2017-05-04 ENCOUNTER — Ambulatory Visit (INDEPENDENT_AMBULATORY_CARE_PROVIDER_SITE_OTHER): Payer: Medicare Other | Admitting: *Deleted

## 2017-05-04 DIAGNOSIS — Z5181 Encounter for therapeutic drug level monitoring: Secondary | ICD-10-CM

## 2017-05-04 DIAGNOSIS — I2699 Other pulmonary embolism without acute cor pulmonale: Secondary | ICD-10-CM | POA: Diagnosis not present

## 2017-05-04 LAB — POCT INR: INR: 3.2

## 2017-05-04 MED ORDER — WARFARIN SODIUM 5 MG PO TABS: ORAL_TABLET | ORAL | 0 refills | Status: DC

## 2017-05-04 NOTE — Patient Instructions (Signed)
Description   Do not take coumadin today March 19th then continue 1 tablet daily except 1/2 tablet on Tuesdays thursdays  and Saturdays. Recheck INR in 2 week.  Main [phone] [facility] [phone]

## 2017-05-12 DIAGNOSIS — M25562 Pain in left knee: Secondary | ICD-10-CM | POA: Diagnosis not present

## 2017-05-12 DIAGNOSIS — M25561 Pain in right knee: Secondary | ICD-10-CM | POA: Diagnosis not present

## 2017-05-12 DIAGNOSIS — M25552 Pain in left hip: Secondary | ICD-10-CM | POA: Diagnosis not present

## 2017-05-12 DIAGNOSIS — M545 Low back pain: Secondary | ICD-10-CM | POA: Diagnosis not present

## 2017-05-12 DIAGNOSIS — M25551 Pain in right hip: Secondary | ICD-10-CM | POA: Diagnosis not present

## 2017-05-12 DIAGNOSIS — G894 Chronic pain syndrome: Secondary | ICD-10-CM | POA: Diagnosis not present

## 2017-05-12 DIAGNOSIS — M25511 Pain in right shoulder: Secondary | ICD-10-CM | POA: Diagnosis not present

## 2017-05-12 DIAGNOSIS — M25512 Pain in left shoulder: Secondary | ICD-10-CM | POA: Diagnosis not present

## 2017-05-18 ENCOUNTER — Ambulatory Visit: Payer: Medicare Other | Admitting: Nurse Practitioner

## 2017-05-18 NOTE — Progress Notes (Deleted)
CARDIOLOGY OFFICE NOTE  Date:  05/18/2017    Amy Rivers Date of Birth: [birth year] Medical Record #[id number]  PCP:  Jackie Plum, MD  Cardiologist:  Tyrone Sage & ***    No chief complaint on file.   History of Present Illness: Amy Rivers is a 52 y.o. female who presents today for a ***   Comes in today. Here with   Past Medical History:  Diagnosis Date  . Allergic rhinitis   . Anticoagulant long-term use   . Bipolar 1 disorder (HCC)   . CAD (coronary artery disease), native coronary artery    ANGINA PRESENCE UNSPECIFIED  . CHF (congestive heart failure) (HCC)   . COPD (chronic obstructive pulmonary disease) (HCC)   . DDD (degenerative disc disease), lumbar   . Fibromyalgia   . Fibromyalgia   . GERD (gastroesophageal reflux disease)   . H/O degenerative disc disease   . Hx of tobacco use, presenting hazards to health   . Hyperlipidemia   . Hypertension   . Low back pain   . Obesity (BMI 30.0-34.9)   . Other chronic pain   . Pulmonary embolism (HCC)   . Scoliosis     Past Surgical History:  Procedure Laterality Date  . ABDOMINAL HYSTERECTOMY    . ABDOMINAL SURGERY    . CARDIAC SURGERY       Medications: No outpatient medications have been marked as taking for the 05/18/17 encounter (Appointment) with Rosalio Macadamia, NP.     Allergies: Allergies  Allergen Reactions  . Keflet [Cephalexin] Anaphylaxis  . Morphine And Related Hives, Shortness Of Breath, Nausea And Vomiting and Rash  . Sulfa Antibiotics Anaphylaxis  . Iodine Hives  . Strawberry Extract Hives and Nausea Only  . Cymbalta [Duloxetine Hcl] Hives and Rash    Social History: The patient  reports that she has been smoking.  She has never used smokeless tobacco. She reports that she does not drink alcohol or use drugs.   Family History: The patient's ***family history includes Arthritis in her father; Hepatitis in her mother.   Review of Systems: Please see the history of  present illness.   Otherwise, the review of systems is positive for {NONE DEFAULTED:18576::"none"}.   All other systems are reviewed and negative.   Physical Exam: VS:  LMP  (LMP Unknown)  .  BMI There is no height or weight on file to calculate BMI.  Wt Readings from Last 3 Encounters:  01/18/17 195 lb (88.5 kg)  11/17/16 199 lb 12.8 oz (90.6 kg)  10/19/16 195 lb (88.5 kg)    General: Pleasant. Well developed, well nourished and in no acute distress.   HEENT: Normal.  Neck: Supple, no JVD, carotid bruits, or masses noted.  Cardiac: ***Regular rate and rhythm. No murmurs, rubs, or gallops. No edema.  Respiratory:  Lungs are clear to auscultation bilaterally with normal work of breathing.  GI: Soft and nontender.  MS: No deformity or atrophy. Gait and ROM intact.  Skin: Warm and dry. Color is normal.  Neuro:  Strength and sensation are intact and no gross focal deficits noted.  Psych: Alert, appropriate and with normal affect.   LABORATORY DATA:  EKG:  EKG {ACTION; IS/IS FAO:[id number]} ordered today. This demonstrates ***.  Lab Results  Component Value Date   WBC 5.2 10/05/2016   HGB 11.7 (L) 10/05/2016   HCT 35.6 (L) 10/05/2016   PLT 305 10/05/2016   GLUCOSE 87 10/05/2016   ALT 10 (L) 06/28/2016  AST 24 06/28/2016   NA 140 10/05/2016   K 4.7 10/05/2016   CL 105 10/05/2016   CREATININE 1.06 (H) 10/05/2016   BUN 6 10/05/2016   CO2 26 10/05/2016   INR 3.2 05/04/2017     BNP (last 3 results) Recent Labs    06/28/16 0826  BNP 75.5    ProBNP (last 3 results) No results for input(s): PROBNP in the last 8760 hours.   Other Studies Reviewed Today:   Assessment/Plan:   Current medicines are reviewed with the patient today.  The patient does not have concerns regarding medicines other than what has been noted above.  The following changes have been made:  See above.  Labs/ tests ordered today include:   No orders of the defined types were placed in this  encounter.    Disposition:   FU with *** in {gen number 0-98:119147}0-10:310397} {Days to years:10300}.   Patient is agreeable to this plan and will call if any problems develop in the interim.   SignedNorma Fredrickson: Farrin Shadle, NP  05/18/2017 7:51 AM  [facility] [address] [place], [place]  [address] Phone: [phone] Fax: [phone]

## 2017-05-19 ENCOUNTER — Encounter: Payer: Self-pay | Admitting: Nurse Practitioner

## 2017-05-26 ENCOUNTER — Ambulatory Visit (INDEPENDENT_AMBULATORY_CARE_PROVIDER_SITE_OTHER): Payer: Medicare Other | Admitting: *Deleted

## 2017-05-26 DIAGNOSIS — I2699 Other pulmonary embolism without acute cor pulmonale: Secondary | ICD-10-CM | POA: Diagnosis not present

## 2017-05-26 DIAGNOSIS — Z5181 Encounter for therapeutic drug level monitoring: Secondary | ICD-10-CM | POA: Diagnosis not present

## 2017-05-26 LAB — POCT INR: INR: 3.2

## 2017-05-26 NOTE — Patient Instructions (Addendum)
Description   Today take 1/2 tablet then start taking 1/2 tablet daily except 1 tablet on Monday, Wednesday, and Friday. Recheck INR in 1 week with MD appt.  Main [phone] [facility] [phone]

## 2017-06-02 NOTE — Progress Notes (Signed)
CARDIOLOGY OFFICE NOTE  Date:  06/03/2017    Amy Rivers Date of Birth: [birth year] Medical Record #[id number]  PCP:  Jackie Plum, MD  Cardiologist:  [facility]   Chief Complaint  Patient presents with  . Coronary Artery Disease    6 month check - seen for Dr. Anne Fu    History of Present Illness: Amy Rivers is a 52 y.o. female who presents today for a 6 month check. Seen for Dr. Anne Fu.   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   She was last seen here back in October - she had not been compliant with INR checks. She had had some chest pain that she was concerned for recurrent PE - CT did not show evidence of thrombosis. EKG with some T wave changes - echo was reassuring.   Comes in today. Here alone. Has lots of issues. Was out of her medicines for about 3 weeks due to pharmacy issue - just got back on about 2 weeks ago. She has chest pain. She is worried about dying. Still smoking.    Past Medical History:  Diagnosis Date  . Allergic rhinitis   . Anticoagulant long-term use   . Bipolar 1 disorder (HCC)   . CAD (coronary artery disease), native coronary artery    ANGINA PRESENCE UNSPECIFIED  . CHF (congestive heart failure) (HCC)   . COPD (chronic obstructive pulmonary disease) (HCC)   . DDD (degenerative disc disease), lumbar   . Fibromyalgia   . Fibromyalgia   . GERD (gastroesophageal reflux disease)   . H/O degenerative disc disease   . Hx of tobacco use, presenting hazards to health   . Hyperlipidemia   . Hypertension   . Low back pain   . Obesity (BMI 30.0-34.9)   . Other chronic pain   . Pulmonary embolism (HCC)   . Scoliosis     Past Surgical History:  Procedure Laterality Date  . ABDOMINAL HYSTERECTOMY    . ABDOMINAL SURGERY    . CARDIAC SURGERY       Medications: Current Meds    Medication Sig  . atorvastatin (LIPITOR) 80 MG tablet Take 80 mg by mouth at bedtime.  . gabapentin (NEURONTIN) 300 MG capsule Take 300 mg by mouth 2 (two) times daily.  Marland Kitchen ibuprofen (ADVIL,MOTRIN) 200 MG tablet Take 400 mg by mouth every 6 (six) hours as needed for moderate pain.  . Melatonin 5 MG CHEW Chew 5 mg by mouth at bedtime.  . metoprolol tartrate (LOPRESSOR) 25 MG tablet Take 50 mg by mouth 2 (two) times daily.   . nitroGLYCERIN (NITROSTAT) 0.4 MG SL tablet Place 1 tablet (0.4 mg total) under the tongue every 5 (five) minutes x 3 doses as needed for chest pain.  Marland Kitchen OLANZapine (ZYPREXA) 10 MG tablet Take 10 mg by mouth at bedtime.  . pantoprazole (PROTONIX) 40 MG tablet Take 40 mg by mouth daily before breakfast.  . tiZANidine (ZANAFLEX) 4 MG tablet TAKE 1 TABLET BY MOUTH THREE TIMES A DAY IF NEEDED  . topiramate (TOPAMAX) 25 MG tablet Take 25 mg by mouth at bedtime.  . traMADol (ULTRAM) 50 MG tablet TK 1 T PO  Q 8 H  . VENTOLIN HFA 108 (90 Base) MCG/ACT inhaler Inhale 2 puffs into the lungs 2 (two) times daily.   Marland Kitchen warfarin (COUMADIN) 5 MG tablet Take as directed by  coumadin clinic  . [DISCONTINUED] buprenorphine-naloxone (SUBOXONE) 8-2 mg SUBL SL tablet Place 1 tablet under the tongue daily.  . [DISCONTINUED] diclofenac (VOLTAREN) 75 MG EC tablet Take 75 mg by mouth 2 (two) times daily.  . [DISCONTINUED] enoxaparin (LOVENOX) 80 MG/0.8ML injection Inject 0.8 mLs (80 mg total) into the skin every 12 (twelve) hours.  . [DISCONTINUED] hydrOXYzine (ATARAX/VISTARIL) 50 MG tablet Take 50 mg by mouth 3 (three) times daily as needed.     Allergies: Allergies  Allergen Reactions  . Keflet [Cephalexin] Anaphylaxis  . Morphine And Related Hives, Shortness Of Breath, Nausea And Vomiting and Rash  . Sulfa Antibiotics Anaphylaxis  . Iodine Hives  . Strawberry Extract Hives and Nausea Only  . Cymbalta [Duloxetine Hcl] Hives and Rash    Social History: The patient  reports that she has  been smoking.  She has never used smokeless tobacco. She reports that she does not drink alcohol or use drugs.   Family History: The patient's family history includes Arthritis in her father; Hepatitis in her mother.   Review of Systems: Please see the history of present illness.   Otherwise, the review of systems is positive for none.   All other systems are reviewed and negative.   Physical Exam: VS:  BP 104/70   Pulse 62   Ht 5' 4.5" (1.638 m)   Wt 204 lb (92.5 kg)   LMP  (LMP Unknown)   SpO2 97% Comment: at rest  BMI 34.48 kg/m  .  BMI Body mass index is 34.48 kg/m.  Wt Readings from Last 3 Encounters:  06/03/17 204 lb (92.5 kg)  01/18/17 195 lb (88.5 kg)  11/17/16 199 lb 12.8 oz (90.6 kg)    General: Rather talkative. Obese. Alert and in no acute distress.   HEENT: Normal.  Neck: Supple, no JVD, carotid bruits, or masses noted.  Cardiac: Regular rate and rhythm. No murmurs, rubs, or gallops. No edema.  Respiratory:  Lungs are clear to auscultation bilaterally with normal work of breathing.  GI: Soft and nontender.  MS: No deformity or atrophy. Gait and ROM intact.  Skin: Warm and dry. Color is normal.  Neuro:  Strength and sensation are intact and no gross focal deficits noted.  Psych: Alert, appropriate and with normal affect.   LABORATORY DATA:  EKG:  EKG is ordered today. This shows NSR - non specific T wave changes - actually looks better today.   Lab Results  Component Value Date   WBC 5.2 10/05/2016   HGB 11.7 (L) 10/05/2016   HCT 35.6 (L) 10/05/2016   PLT 305 10/05/2016   GLUCOSE 87 10/05/2016   ALT 10 (L) 06/28/2016   AST 24 06/28/2016   NA 140 10/05/2016   K 4.7 10/05/2016   CL 105 10/05/2016   CREATININE 1.06 (H) 10/05/2016   BUN 6 10/05/2016   CO2 26 10/05/2016   INR 3.2 05/26/2017       BNP (last 3 results) Recent Labs    06/28/16 0826  BNP 75.5    ProBNP (last 3 results) No results for input(s): PROBNP in the last 8760  hours.   Other Studies Reviewed Today:  Echo Study Conclusions 11/2016  - Left ventricle: The cavity size was normal. Wall thickness was   increased in a pattern of mild LVH. Systolic function was normal.   The estimated ejection fraction was in the range of 60% to 65%.   Wall motion was normal; there were no regional wall motion  abnormalities. Doppler parameters are consistent with abnormal   left ventricular relaxation (grade 1 diastolic dysfunction). The   E/e&' ratio is between 8-15, suggesting indeterminate LV filling   pressure. - Mitral valve: Mildly thickened leaflets . There was trivial   regurgitation. - Left atrium: The atrium was normal in size. - Tricuspid valve: There was mild regurgitation. - Pulmonary arteries: PA peak pressure: 43 mm Hg (S). - Inferior vena cava: The vessel was normal in size. The   respirophasic diameter changes were in the normal range (>= 50%),   consistent with normal central venous pressure.  Impressions:  - LVEF 60-65%, mild LVH, normal wall motion, grade 1 DD,   indeterminate LV filling pressure, trivial MR, normal LA size,   mild TR, RVSP 43 mmHg, normal IVC.   Assessment/Plan:  1. Diastolic HF  2. History of recurrent PE  3. Chronic anticoagulation  4. HTN  5. CAD with prior CABG x 1 -   6. HLD  7. Ongoing tobacco abuse  Curent medicines are reviewed with the patient today.  The patient does not have concerns regarding medicines other than what has been noted above.  The following changes have been made:  See above.  Labs/ tests ordered today include:    Orders Placed This Encounter  Procedures  . Basic metabolic panel  . CBC  . Hepatic function panel  . Lipid panel  . MYOCARDIAL PERFUSION IMAGING  . EKG 12-Lead     Disposition:   FU with Dr. Anne Fu in 6 months.    Patient is agreeable to this plan and will call if any problems develop in the interim.   SignedNorma Fredrickson, NP  06/03/2017 2:19  PM  [facility] [address] [place], [place]  [address] Phone: [phone] Fax: [phone]

## 2017-06-03 ENCOUNTER — Encounter: Payer: Self-pay | Admitting: Nurse Practitioner

## 2017-06-03 ENCOUNTER — Ambulatory Visit (INDEPENDENT_AMBULATORY_CARE_PROVIDER_SITE_OTHER): Payer: Medicare Other | Admitting: Nurse Practitioner

## 2017-06-03 ENCOUNTER — Ambulatory Visit (INDEPENDENT_AMBULATORY_CARE_PROVIDER_SITE_OTHER): Payer: Medicare Other | Admitting: *Deleted

## 2017-06-03 VITALS — BP 104/70 | HR 62 | Ht 64.5 in | Wt 204.0 lb

## 2017-06-03 DIAGNOSIS — Z720 Tobacco use: Secondary | ICD-10-CM | POA: Diagnosis not present

## 2017-06-03 DIAGNOSIS — Z7901 Long term (current) use of anticoagulants: Secondary | ICD-10-CM

## 2017-06-03 DIAGNOSIS — R0789 Other chest pain: Secondary | ICD-10-CM

## 2017-06-03 DIAGNOSIS — I259 Chronic ischemic heart disease, unspecified: Secondary | ICD-10-CM

## 2017-06-03 DIAGNOSIS — Z79899 Other long term (current) drug therapy: Secondary | ICD-10-CM | POA: Diagnosis not present

## 2017-06-03 DIAGNOSIS — Z86711 Personal history of pulmonary embolism: Secondary | ICD-10-CM

## 2017-06-03 DIAGNOSIS — Z5181 Encounter for therapeutic drug level monitoring: Secondary | ICD-10-CM | POA: Diagnosis not present

## 2017-06-03 DIAGNOSIS — I2699 Other pulmonary embolism without acute cor pulmonale: Secondary | ICD-10-CM

## 2017-06-03 LAB — POCT INR: INR: 1.6

## 2017-06-03 NOTE — Progress Notes (Incomplete)
CARDIOLOGY OFFICE NOTE  Date:  06/03/2017    Amy Rivers Date of Birth: 12/20/1965 Medical Record #[id number]  PCP:  Jackie Plumsei-Bonsu, George, MD  Cardiologist:  [facility]   Chief Complaint  Patient presents with  . Coronary Artery Disease    6 month check - seen for Dr. Anne FuSkains    History of Present Illness: Amy OkaKelly Grether is a 52 y.o. female who presents today for a 6 month check. Seen for Dr. Anne FuSkains.   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   She was last seen here back in October - she had not been compliant with INR checks. She had had some chest pain that she was concerned for recurrent PE - CT did not show evidence of thrombosis. EKG with some T wave changes - echo was reassuring.   Comes in today. Here alone. She notes lots of issues. Was out of her medicines for about 3 weeks due to pharmacy issue - just got back on about 2 weeks ago. She has chest pain. She says she always has some degree of chest pain. She notes she is "thinking too much" - this makes her have more chest pain. She will use NTG - did last night. She feels like it is "my stomach that is messed up".  Still smoking.  Admits she does not wish to quit. Lots of stress. She has been worried about her grandson who went to [place] on a school trip. Tells me her BP always runs low. She is planning on seeing her psych and PCP soon to get back on some medicines - unclear as to what she is on and not on. But then tells me she is taking her medicines. No recent labs. Apparently has had a lung nodule on prior CT scan. She says she has had some swelling - salt use is questionable - had barbeque yesterday.   Past Medical History:  Diagnosis Date  . Allergic rhinitis   . Anticoagulant long-term use   . Bipolar 1 disorder (HCC)   . CAD (coronary artery disease), native coronary artery      ANGINA PRESENCE UNSPECIFIED  . CHF (congestive heart failure) (HCC)   . COPD (chronic obstructive pulmonary disease) (HCC)   . DDD (degenerative disc disease), lumbar   . Fibromyalgia   . Fibromyalgia   . GERD (gastroesophageal reflux disease)   . H/O degenerative disc disease   . Hx of tobacco use, presenting hazards to health   . Hyperlipidemia   . Hypertension   . Low back pain   . Obesity (BMI 30.0-34.9)   . Other chronic pain   . Pulmonary embolism (HCC)   . Scoliosis     Past Surgical History:  Procedure Laterality Date  . ABDOMINAL HYSTERECTOMY    . ABDOMINAL SURGERY    . CARDIAC SURGERY       Medications: Current Meds  Medication Sig  . atorvastatin (LIPITOR) 80 MG tablet Take 80 mg by mouth at bedtime.  . gabapentin (NEURONTIN) 300 MG capsule Take 300 mg by mouth 2 (two) times daily.  Marland Kitchen. ibuprofen (ADVIL,MOTRIN) 200 MG tablet Take 400 mg by mouth every 6 (six) hours as needed for moderate pain.  . Melatonin 5 MG CHEW Chew 5 mg by mouth at bedtime.  . metoprolol tartrate (LOPRESSOR) 25 MG tablet Take 50 mg by mouth 2 (two) times  daily.   . nitroGLYCERIN (NITROSTAT) 0.4 MG SL tablet Place 1 tablet (0.4 mg total) under the tongue every 5 (five) minutes x 3 doses as needed for chest pain.  Marland Kitchen OLANZapine (ZYPREXA) 10 MG tablet Take 10 mg by mouth at bedtime.  . pantoprazole (PROTONIX) 40 MG tablet Take 40 mg by mouth daily before breakfast.  . tiZANidine (ZANAFLEX) 4 MG tablet TAKE 1 TABLET BY MOUTH THREE TIMES A DAY IF NEEDED  . topiramate (TOPAMAX) 25 MG tablet Take 25 mg by mouth at bedtime.  . traMADol (ULTRAM) 50 MG tablet TK 1 T PO  Q 8 H  . VENTOLIN HFA 108 (90 Base) MCG/ACT inhaler Inhale 2 puffs into the lungs 2 (two) times daily.   Marland Kitchen warfarin (COUMADIN) 5 MG tablet Take as directed by coumadin clinic  . [DISCONTINUED] buprenorphine-naloxone (SUBOXONE) 8-2 mg SUBL SL tablet Place 1 tablet under the tongue daily.  . [DISCONTINUED] diclofenac (VOLTAREN) 75 MG EC  tablet Take 75 mg by mouth 2 (two) times daily.  . [DISCONTINUED] enoxaparin (LOVENOX) 80 MG/0.8ML injection Inject 0.8 mLs (80 mg total) into the skin every 12 (twelve) hours.  . [DISCONTINUED] hydrOXYzine (ATARAX/VISTARIL) 50 MG tablet Take 50 mg by mouth 3 (three) times daily as needed.     Allergies: Allergies  Allergen Reactions  . Keflet [Cephalexin] Anaphylaxis  . Morphine And Related Hives, Shortness Of Breath, Nausea And Vomiting and Rash  . Sulfa Antibiotics Anaphylaxis  . Iodine Hives  . Strawberry Extract Hives and Nausea Only  . Cymbalta [Duloxetine Hcl] Hives and Rash    Social History: The patient  reports that she has been smoking.  She has never used smokeless tobacco. She reports that she does not drink alcohol or use drugs.   Family History: The patient's family history includes Arthritis in her father; Hepatitis in her mother.   Review of Systems: Please see the history of present illness.   Otherwise, the review of systems is positive for none.   All other systems are reviewed and negative.   Physical Exam: VS:  BP 104/70   Pulse 62   Ht 5' 4.5" (1.638 m)   Wt 204 lb (92.5 kg)   LMP  (LMP Unknown)   SpO2 97% Comment: at rest  BMI 34.48 kg/m  .  BMI Body mass index is 34.48 kg/m.  Wt Readings from Last 3 Encounters:  06/03/17 204 lb (92.5 kg)  01/18/17 195 lb (88.5 kg)  11/17/16 199 lb 12.8 oz (90.6 kg)    General: Rather talkative - pleasant. Obese. Alert and in no acute distress.   HEENT: Normal.  Neck: Supple, no JVD, carotid bruits, or masses noted.  Cardiac: Regular rate and rhythm. No murmurs, rubs, or gallops. No edema.  Respiratory:  Lungs are clear to auscultation bilaterally with normal work of breathing.  GI: Soft and nontender.  MS: No deformity or atrophy. Gait and ROM intact.  Skin: Warm and dry. Color is normal.  Neuro:  Strength and sensation are intact and no gross focal deficits noted.  Psych: Alert, appropriate and with normal  affect.   LABORATORY DATA:  EKG:  EKG is ordered today. This shows NSR - non specific T wave changes - actually looks better today.   Lab Results  Component Value Date   WBC 5.2 10/05/2016   HGB 11.7 (L) 10/05/2016   HCT 35.6 (L) 10/05/2016   PLT 305 10/05/2016   GLUCOSE 87 10/05/2016   ALT 10 (L) 06/28/2016  AST 24 06/28/2016   NA 140 10/05/2016   K 4.7 10/05/2016   CL 105 10/05/2016   CREATININE 1.06 (H) 10/05/2016   BUN 6 10/05/2016   CO2 26 10/05/2016   INR 3.2 05/26/2017       BNP (last 3 results) Recent Labs    06/28/16 0826  BNP 75.5    ProBNP (last 3 results) No results for input(s): PROBNP in the last 8760 hours.   Other Studies Reviewed Today:  Echo Study Conclusions 11/2016  - Left ventricle: The cavity size was normal. Wall thickness was   increased in a pattern of mild LVH. Systolic function was normal.   The estimated ejection fraction was in the range of 60% to 65%.   Wall motion was normal; there were no regional wall motion   abnormalities. Doppler parameters are consistent with abnormal   left ventricular relaxation (grade 1 diastolic dysfunction). The   E/e&' ratio is between 8-15, suggesting indeterminate LV filling   pressure. - Mitral valve: Mildly thickened leaflets . There was trivial   regurgitation. - Left atrium: The atrium was normal in size. - Tricuspid valve: There was mild regurgitation. - Pulmonary arteries: PA peak pressure: 43 mm Hg (S). - Inferior vena cava: The vessel was normal in size. The   respirophasic diameter changes were in the normal range (>= 50%),   consistent with normal central venous pressure.  Impressions:  - LVEF 60-65%, mild LVH, normal wall motion, grade 1 DD,   indeterminate LV filling pressure, trivial MR, normal LA size,   mild TR, RVSP 43 mmHg, normal IVC.   Assessment/Plan:  1. Multitude of somatic complaints - will check lab today. Arranging for [employer]   2. Diastolic HF -  has soft BP - needs salt restriction.   3. History of recurrent PE - on coumadin - checking today. Was off for uncertain duration due to pharmacies switching  4. Chronic anticoagulation - checking today.   5. HTN - Bp actually soft. She says this is chronic  6. CAD with prior CABG x 1 - this was in [place] - no recent follow up testing - will obtain lexiscan Myoview. Further disposition to follow.   7. HLD - needs labs  8. Ongoing tobacco abuse- not ready to stop  9. Lung nodule - notes she needs repeat scan in August of 2019 - she is aware - followed by PCP  10. Bipolar disorder - unclear how much of this is playing a role.   Curent medicines are reviewed with the patient today.  The patient does not have concerns regarding medicines other than what has been noted above.  The following changes have been made:  See above.  Labs/ tests ordered today include:    Orders Placed This Encounter  Procedures  . Basic metabolic panel  . CBC  . Hepatic function panel  . Lipid panel  . MYOCARDIAL PERFUSION IMAGING  . EKG 12-Lead     Disposition:   FU with Dr. Anne Fu in 6 months.    Patient is agreeable to this plan and will call if any problems develop in the interim.   SignedNorma Fredrickson, NP  06/03/2017 2:19 PM  [facility] [address] [place], [place]  [address] Phone: [phone] Fax: [phone]

## 2017-06-03 NOTE — Patient Instructions (Addendum)
We will be checking the following labs today - BMET, CBC, HPF, Lipids   Medication Instructions:    Continue with your current medicines.     Testing/Procedures To Be Arranged:  Lexiscan Myoview  Follow-Up:   See Dr. Anne FuSkains in about 4 months - we will see you back sooner if you do not pass your stress test.      Other Special Instructions:  You are scheduled for a Myocardial Perfusion Imaging Study on Wednesday, May 1st at 10Am.   Please arrive 15 minutes prior to your appointment time for registration and insurance purposes.   The test will take approximately 3 to 4 hours to complete; you may bring reading material. If someone comes with you to your appointment, they will need to remain in the main lobby due to limited space in the testing area.   If you are pregnant or breastfeeding, please notify the nuclear lab prior to your appointment.   How to prepare for your Myocardial Perfusion test:   Do not eat or drink 3 hours prior to your test, except you may have water.    Do not consume products containing caffeine (regular or decaffeinated) 12 hours prior to your test (ex: coffee, chocolate, soda, tea)   Do bring a list of your current medications with you. If not listed below, you may take your medications as normal.      Bring any held medication to your appointment, as you may be required to take it once the test is complete.   Do wear comfortable clothes (no dresses or overalls) and walking shoes. Tennis shoes are preferred. No heels or open toed shoes.  Do not wear cologne, perfume, aftershave or lotions (deodorant is allowed).   If these instructions are not followed, you test will have to be rescheduled.   Please report to [address] for your test. If you have questions or concerns about your appointment, please call the Nuclear Lab at #[phone].  If you cannot keep your appointment, please provide 24 hour notification to the  Nuclear lab to avoid a possible $50 charge to your account.           If you need a refill on your cardiac medications before your next appointment, please call your pharmacy.   Call the [facility] office at [phone] if you have any questions, problems or concerns.

## 2017-06-03 NOTE — Patient Instructions (Addendum)
  Description   Today take 1 tablet then start  taking 1 tablet daily except 1/2 tablet on Tuesday, Thursday, Saturday.  Recheck INR in 2 weeks per pt request.  Main [phone] Cou[facility]in Clinic [phone]

## 2017-06-04 LAB — CBC
Hematocrit: 38.3 % (ref 34.0–46.6)
Hemoglobin: 12 g/dL (ref 11.1–15.9)
MCH: 29.1 pg (ref 26.6–33.0)
MCHC: 31.3 g/dL — ABNORMAL LOW (ref 31.5–35.7)
MCV: 93 fL (ref 79–97)
Platelets: 298 10*3/uL (ref 150–379)
RBC: 4.12 x10E6/uL (ref 3.77–5.28)
RDW: 15.7 % — ABNORMAL HIGH (ref 12.3–15.4)
WBC: 5.7 10*3/uL (ref 3.4–10.8)

## 2017-06-04 LAB — BASIC METABOLIC PANEL
BUN/Creatinine Ratio: 9 (ref 9–23)
BUN: 8 mg/dL (ref 6–24)
CO2: 22 mmol/L (ref 20–29)
Calcium: 8.9 mg/dL (ref 8.7–10.2)
Chloride: 108 mmol/L — ABNORMAL HIGH (ref 96–106)
Creatinine, Ser: 0.88 mg/dL (ref 0.57–1.00)
GFR calc Af Amer: 87 mL/min/{1.73_m2} (ref >59)
GFR calc non Af Amer: 76 mL/min/{1.73_m2} (ref >59)
Glucose: 68 mg/dL (ref 65–99)
Potassium: 4.4 mmol/L (ref 3.5–5.2)
Sodium: 144 mmol/L (ref 134–144)

## 2017-06-04 LAB — HEPATIC FUNCTION PANEL
ALT: 11 IU/L (ref 0–32)
AST: 21 IU/L (ref 0–40)
Albumin: 4.1 g/dL (ref 3.5–5.5)
Alkaline Phosphatase: 95 IU/L (ref 39–117)
Bilirubin Total: 0.5 mg/dL (ref 0.0–1.2)
Bilirubin, Direct: 0.15 mg/dL (ref 0.00–0.40)
Total Protein: 6.4 g/dL (ref 6.0–8.5)

## 2017-06-04 LAB — LIPID PANEL
Chol/HDL Ratio: 3 ratio (ref 0.0–4.4)
Cholesterol, Total: 139 mg/dL (ref 100–199)
HDL: 47 mg/dL (ref >39)
LDL Calculated: 74 mg/dL (ref 0–99)
Triglycerides: 88 mg/dL (ref 0–149)
VLDL Cholesterol Cal: 18 mg/dL (ref 5–40)

## 2017-06-10 DIAGNOSIS — M25512 Pain in left shoulder: Secondary | ICD-10-CM | POA: Diagnosis not present

## 2017-06-10 DIAGNOSIS — M25551 Pain in right hip: Secondary | ICD-10-CM | POA: Diagnosis not present

## 2017-06-10 DIAGNOSIS — M545 Low back pain: Secondary | ICD-10-CM | POA: Diagnosis not present

## 2017-06-10 DIAGNOSIS — G894 Chronic pain syndrome: Secondary | ICD-10-CM | POA: Diagnosis not present

## 2017-06-10 DIAGNOSIS — M25561 Pain in right knee: Secondary | ICD-10-CM | POA: Diagnosis not present

## 2017-06-10 DIAGNOSIS — M25511 Pain in right shoulder: Secondary | ICD-10-CM | POA: Diagnosis not present

## 2017-06-10 DIAGNOSIS — M25552 Pain in left hip: Secondary | ICD-10-CM | POA: Diagnosis not present

## 2017-06-10 DIAGNOSIS — M25562 Pain in left knee: Secondary | ICD-10-CM | POA: Diagnosis not present

## 2017-06-14 ENCOUNTER — Telehealth (HOSPITAL_COMMUNITY): Payer: Self-pay | Admitting: *Deleted

## 2017-06-14 DIAGNOSIS — J449 Chronic obstructive pulmonary disease, unspecified: Secondary | ICD-10-CM | POA: Diagnosis not present

## 2017-06-14 DIAGNOSIS — J309 Allergic rhinitis, unspecified: Secondary | ICD-10-CM | POA: Diagnosis not present

## 2017-06-14 DIAGNOSIS — E669 Obesity, unspecified: Secondary | ICD-10-CM | POA: Diagnosis not present

## 2017-06-14 DIAGNOSIS — K219 Gastro-esophageal reflux disease without esophagitis: Secondary | ICD-10-CM | POA: Diagnosis not present

## 2017-06-14 DIAGNOSIS — R350 Frequency of micturition: Secondary | ICD-10-CM | POA: Diagnosis not present

## 2017-06-14 DIAGNOSIS — G894 Chronic pain syndrome: Secondary | ICD-10-CM | POA: Diagnosis not present

## 2017-06-14 DIAGNOSIS — I2510 Atherosclerotic heart disease of native coronary artery without angina pectoris: Secondary | ICD-10-CM | POA: Diagnosis not present

## 2017-06-14 DIAGNOSIS — E785 Hyperlipidemia, unspecified: Secondary | ICD-10-CM | POA: Diagnosis not present

## 2017-06-14 DIAGNOSIS — Z87891 Personal history of nicotine dependence: Secondary | ICD-10-CM | POA: Diagnosis not present

## 2017-06-14 DIAGNOSIS — Z7901 Long term (current) use of anticoagulants: Secondary | ICD-10-CM | POA: Diagnosis not present

## 2017-06-14 DIAGNOSIS — I119 Hypertensive heart disease without heart failure: Secondary | ICD-10-CM | POA: Diagnosis not present

## 2017-06-14 NOTE — Telephone Encounter (Signed)
Patient given detailed instructions per Myocardial Perfusion Study Information Sheet for the test on 06/16/17 at 1000. Patient notified to arrive 15 minutes early and that it is imperative to arrive on time for appointment to keep from having the test rescheduled.  If you need to cancel or reschedule your appointment, please call the office within 24 hours of your appointment. . Patient verbalized understanding.Amy Rivers    

## 2017-06-16 ENCOUNTER — Ambulatory Visit (INDEPENDENT_AMBULATORY_CARE_PROVIDER_SITE_OTHER): Payer: Medicare Other | Admitting: *Deleted

## 2017-06-16 ENCOUNTER — Ambulatory Visit (HOSPITAL_COMMUNITY): Payer: Medicare Other | Attending: Cardiology

## 2017-06-16 DIAGNOSIS — I2510 Atherosclerotic heart disease of native coronary artery without angina pectoris: Secondary | ICD-10-CM | POA: Insufficient documentation

## 2017-06-16 DIAGNOSIS — I10 Essential (primary) hypertension: Secondary | ICD-10-CM | POA: Diagnosis not present

## 2017-06-16 DIAGNOSIS — R0789 Other chest pain: Secondary | ICD-10-CM | POA: Diagnosis not present

## 2017-06-16 DIAGNOSIS — I2699 Other pulmonary embolism without acute cor pulmonale: Secondary | ICD-10-CM

## 2017-06-16 DIAGNOSIS — Z5181 Encounter for therapeutic drug level monitoring: Secondary | ICD-10-CM

## 2017-06-16 DIAGNOSIS — I259 Chronic ischemic heart disease, unspecified: Secondary | ICD-10-CM | POA: Insufficient documentation

## 2017-06-16 DIAGNOSIS — E785 Hyperlipidemia, unspecified: Secondary | ICD-10-CM | POA: Diagnosis not present

## 2017-06-16 LAB — MYOCARDIAL PERFUSION IMAGING
LV dias vol: 111 mL (ref 46–106)
LV sys vol: 48 mL
Peak HR: 91 {beats}/min
RATE: 0.2
Rest HR: 56 {beats}/min
SDS: 1
SRS: 3
SSS: 4
TID: 0.86

## 2017-06-16 LAB — POCT INR: INR: 3

## 2017-06-16 MED ORDER — TECHNETIUM TC 99M TETROFOSMIN IV KIT
10.3000 | PACK | Freq: Once | INTRAVENOUS | Status: AC | PRN
  Administered 2017-06-16: 10.3 via INTRAVENOUS
  Filled 2017-06-16: qty 11

## 2017-06-16 MED ORDER — REGADENOSON 0.4 MG/5ML IV SOLN
0.4000 mg | Freq: Once | INTRAVENOUS | Status: AC
  Administered 2017-06-16: 0.4 mg via INTRAVENOUS

## 2017-06-16 MED ORDER — TECHNETIUM TC 99M TETROFOSMIN IV KIT
32.3000 | PACK | Freq: Once | INTRAVENOUS | Status: AC | PRN
  Administered 2017-06-16: 32.3 via INTRAVENOUS
  Filled 2017-06-16: qty 33

## 2017-06-16 NOTE — Patient Instructions (Signed)
Description   Continue taking 1 tablet daily except 1/2 tablet on Tuesday, Thursday, Saturday.  Recheck INR in 3 weeks.  Main [phone] Coumadin Clinic [phone]

## 2017-07-05 DIAGNOSIS — J449 Chronic obstructive pulmonary disease, unspecified: Secondary | ICD-10-CM | POA: Diagnosis not present

## 2017-07-05 DIAGNOSIS — I2510 Atherosclerotic heart disease of native coronary artery without angina pectoris: Secondary | ICD-10-CM | POA: Diagnosis not present

## 2017-07-05 DIAGNOSIS — Z87891 Personal history of nicotine dependence: Secondary | ICD-10-CM | POA: Diagnosis not present

## 2017-07-05 DIAGNOSIS — R52 Pain, unspecified: Secondary | ICD-10-CM | POA: Diagnosis not present

## 2017-07-05 DIAGNOSIS — E785 Hyperlipidemia, unspecified: Secondary | ICD-10-CM | POA: Diagnosis not present

## 2017-07-05 DIAGNOSIS — E669 Obesity, unspecified: Secondary | ICD-10-CM | POA: Diagnosis not present

## 2017-07-05 DIAGNOSIS — Z30011 Encounter for initial prescription of contraceptive pills: Secondary | ICD-10-CM | POA: Diagnosis not present

## 2017-07-05 DIAGNOSIS — Z7901 Long term (current) use of anticoagulants: Secondary | ICD-10-CM | POA: Diagnosis not present

## 2017-07-05 DIAGNOSIS — G894 Chronic pain syndrome: Secondary | ICD-10-CM | POA: Diagnosis not present

## 2017-07-05 DIAGNOSIS — I119 Hypertensive heart disease without heart failure: Secondary | ICD-10-CM | POA: Diagnosis not present

## 2017-07-05 DIAGNOSIS — J309 Allergic rhinitis, unspecified: Secondary | ICD-10-CM | POA: Diagnosis not present

## 2017-07-05 DIAGNOSIS — R350 Frequency of micturition: Secondary | ICD-10-CM | POA: Diagnosis not present

## 2017-07-05 DIAGNOSIS — K219 Gastro-esophageal reflux disease without esophagitis: Secondary | ICD-10-CM | POA: Diagnosis not present

## 2017-07-07 ENCOUNTER — Ambulatory Visit (INDEPENDENT_AMBULATORY_CARE_PROVIDER_SITE_OTHER): Payer: Medicare Other | Admitting: *Deleted

## 2017-07-07 ENCOUNTER — Encounter (INDEPENDENT_AMBULATORY_CARE_PROVIDER_SITE_OTHER): Payer: Self-pay

## 2017-07-07 DIAGNOSIS — Z5181 Encounter for therapeutic drug level monitoring: Secondary | ICD-10-CM | POA: Diagnosis not present

## 2017-07-07 DIAGNOSIS — I2699 Other pulmonary embolism without acute cor pulmonale: Secondary | ICD-10-CM | POA: Diagnosis not present

## 2017-07-07 LAB — POCT INR: INR: 5.3 — AB (ref 2.0–3.0)

## 2017-07-07 NOTE — Patient Instructions (Signed)
Description   Skip today and tomorrow's dose, then Continue taking 1 tablet daily except 1/2 tablet on Tuesday, Thursday, and Saturday.  Recheck INR in 1 week.  Main [phone] [facility]in [facility] [phone]

## 2017-07-19 ENCOUNTER — Ambulatory Visit (INDEPENDENT_AMBULATORY_CARE_PROVIDER_SITE_OTHER): Payer: Medicare Other | Admitting: *Deleted

## 2017-07-19 DIAGNOSIS — I2699 Other pulmonary embolism without acute cor pulmonale: Secondary | ICD-10-CM

## 2017-07-19 DIAGNOSIS — Z5181 Encounter for therapeutic drug level monitoring: Secondary | ICD-10-CM

## 2017-07-19 LAB — POCT INR: INR: 3 (ref 2.0–3.0)

## 2017-07-19 NOTE — Patient Instructions (Signed)
Description   Continue taking 1 tablet daily except 1/2 tablet on Tuesday, Thursday, and Saturday.  Recheck INR in 2 weeks.  Main [phone] Coumadin Clinic [phone]

## 2017-07-30 DIAGNOSIS — M25511 Pain in right shoulder: Secondary | ICD-10-CM | POA: Diagnosis not present

## 2017-07-30 DIAGNOSIS — G894 Chronic pain syndrome: Secondary | ICD-10-CM | POA: Diagnosis not present

## 2017-07-30 DIAGNOSIS — M25512 Pain in left shoulder: Secondary | ICD-10-CM | POA: Diagnosis not present

## 2017-07-30 DIAGNOSIS — M545 Low back pain: Secondary | ICD-10-CM | POA: Diagnosis not present

## 2017-07-30 DIAGNOSIS — M25552 Pain in left hip: Secondary | ICD-10-CM | POA: Diagnosis not present

## 2017-07-30 DIAGNOSIS — M25562 Pain in left knee: Secondary | ICD-10-CM | POA: Diagnosis not present

## 2017-07-30 DIAGNOSIS — M25561 Pain in right knee: Secondary | ICD-10-CM | POA: Diagnosis not present

## 2017-08-02 ENCOUNTER — Other Ambulatory Visit: Payer: Self-pay | Admitting: *Deleted

## 2017-08-10 DIAGNOSIS — K219 Gastro-esophageal reflux disease without esophagitis: Secondary | ICD-10-CM | POA: Diagnosis not present

## 2017-08-10 DIAGNOSIS — I2510 Atherosclerotic heart disease of native coronary artery without angina pectoris: Secondary | ICD-10-CM | POA: Diagnosis not present

## 2017-08-10 DIAGNOSIS — A048 Other specified bacterial intestinal infections: Secondary | ICD-10-CM | POA: Diagnosis not present

## 2017-08-10 DIAGNOSIS — R350 Frequency of micturition: Secondary | ICD-10-CM | POA: Diagnosis not present

## 2017-08-10 DIAGNOSIS — J309 Allergic rhinitis, unspecified: Secondary | ICD-10-CM | POA: Diagnosis not present

## 2017-08-10 DIAGNOSIS — G894 Chronic pain syndrome: Secondary | ICD-10-CM | POA: Diagnosis not present

## 2017-08-10 DIAGNOSIS — Z7901 Long term (current) use of anticoagulants: Secondary | ICD-10-CM | POA: Diagnosis not present

## 2017-08-10 DIAGNOSIS — E785 Hyperlipidemia, unspecified: Secondary | ICD-10-CM | POA: Diagnosis not present

## 2017-08-10 DIAGNOSIS — I119 Hypertensive heart disease without heart failure: Secondary | ICD-10-CM | POA: Diagnosis not present

## 2017-08-10 DIAGNOSIS — Z87891 Personal history of nicotine dependence: Secondary | ICD-10-CM | POA: Diagnosis not present

## 2017-08-10 DIAGNOSIS — E669 Obesity, unspecified: Secondary | ICD-10-CM | POA: Diagnosis not present

## 2017-08-10 DIAGNOSIS — J449 Chronic obstructive pulmonary disease, unspecified: Secondary | ICD-10-CM | POA: Diagnosis not present

## 2017-08-16 ENCOUNTER — Other Ambulatory Visit: Payer: Self-pay | Admitting: Cardiology

## 2017-08-18 ENCOUNTER — Ambulatory Visit (INDEPENDENT_AMBULATORY_CARE_PROVIDER_SITE_OTHER): Payer: Medicare Other | Admitting: *Deleted

## 2017-08-18 DIAGNOSIS — Z5181 Encounter for therapeutic drug level monitoring: Secondary | ICD-10-CM | POA: Diagnosis not present

## 2017-08-18 DIAGNOSIS — I2699 Other pulmonary embolism without acute cor pulmonale: Secondary | ICD-10-CM

## 2017-08-18 LAB — POCT INR: INR: 5.8 — AB (ref 2.0–3.0)

## 2017-08-18 NOTE — Patient Instructions (Signed)
Description   Skip today, tomorrow and Friday's dose, then start taking 1/2 tablet daily except 1 tablet on Mondays, Wednesdays and Fridays.  Recheck INR in 1 week.   Main [phone] Coumadin Clinic [phone]

## 2017-08-25 ENCOUNTER — Ambulatory Visit (INDEPENDENT_AMBULATORY_CARE_PROVIDER_SITE_OTHER): Payer: Medicare Other | Admitting: *Deleted

## 2017-08-25 DIAGNOSIS — I2699 Other pulmonary embolism without acute cor pulmonale: Secondary | ICD-10-CM

## 2017-08-25 DIAGNOSIS — Z5181 Encounter for therapeutic drug level monitoring: Secondary | ICD-10-CM

## 2017-08-25 LAB — POCT INR: INR: 1.6 — AB (ref 2.0–3.0)

## 2017-08-25 NOTE — Patient Instructions (Signed)
Description   Today take 1.5 tablets then continue taking 1/2 tablet daily except 1 tablet on Mondays, Wednesdays and Fridays.  Recheck INR in 2 weeks.   Main [phone] Coumadin Clinic [phone]

## 2017-08-27 DIAGNOSIS — M25511 Pain in right shoulder: Secondary | ICD-10-CM | POA: Diagnosis not present

## 2017-08-27 DIAGNOSIS — M25512 Pain in left shoulder: Secondary | ICD-10-CM | POA: Diagnosis not present

## 2017-08-27 DIAGNOSIS — M545 Low back pain: Secondary | ICD-10-CM | POA: Diagnosis not present

## 2017-08-27 DIAGNOSIS — G894 Chronic pain syndrome: Secondary | ICD-10-CM | POA: Diagnosis not present

## 2017-08-27 DIAGNOSIS — M25552 Pain in left hip: Secondary | ICD-10-CM | POA: Diagnosis not present

## 2017-08-27 DIAGNOSIS — M25561 Pain in right knee: Secondary | ICD-10-CM | POA: Diagnosis not present

## 2017-08-27 DIAGNOSIS — M25562 Pain in left knee: Secondary | ICD-10-CM | POA: Diagnosis not present

## 2017-08-27 DIAGNOSIS — M25551 Pain in right hip: Secondary | ICD-10-CM | POA: Diagnosis not present

## 2017-09-13 DIAGNOSIS — Z87891 Personal history of nicotine dependence: Secondary | ICD-10-CM | POA: Diagnosis not present

## 2017-09-13 DIAGNOSIS — K219 Gastro-esophageal reflux disease without esophagitis: Secondary | ICD-10-CM | POA: Diagnosis not present

## 2017-09-13 DIAGNOSIS — G894 Chronic pain syndrome: Secondary | ICD-10-CM | POA: Diagnosis not present

## 2017-09-13 DIAGNOSIS — J309 Allergic rhinitis, unspecified: Secondary | ICD-10-CM | POA: Diagnosis not present

## 2017-09-13 DIAGNOSIS — R350 Frequency of micturition: Secondary | ICD-10-CM | POA: Diagnosis not present

## 2017-09-13 DIAGNOSIS — J449 Chronic obstructive pulmonary disease, unspecified: Secondary | ICD-10-CM | POA: Diagnosis not present

## 2017-09-13 DIAGNOSIS — E669 Obesity, unspecified: Secondary | ICD-10-CM | POA: Diagnosis not present

## 2017-09-13 DIAGNOSIS — Z7901 Long term (current) use of anticoagulants: Secondary | ICD-10-CM | POA: Diagnosis not present

## 2017-09-13 DIAGNOSIS — E785 Hyperlipidemia, unspecified: Secondary | ICD-10-CM | POA: Diagnosis not present

## 2017-09-13 DIAGNOSIS — I119 Hypertensive heart disease without heart failure: Secondary | ICD-10-CM | POA: Diagnosis not present

## 2017-09-13 DIAGNOSIS — I2510 Atherosclerotic heart disease of native coronary artery without angina pectoris: Secondary | ICD-10-CM | POA: Diagnosis not present

## 2017-09-20 ENCOUNTER — Encounter (INDEPENDENT_AMBULATORY_CARE_PROVIDER_SITE_OTHER): Payer: Self-pay

## 2017-09-20 ENCOUNTER — Ambulatory Visit (INDEPENDENT_AMBULATORY_CARE_PROVIDER_SITE_OTHER): Payer: Medicare Other | Admitting: *Deleted

## 2017-09-20 DIAGNOSIS — I2699 Other pulmonary embolism without acute cor pulmonale: Secondary | ICD-10-CM | POA: Diagnosis not present

## 2017-09-20 DIAGNOSIS — Z5181 Encounter for therapeutic drug level monitoring: Secondary | ICD-10-CM

## 2017-09-20 LAB — PROTIME-INR
INR: 7.5 (ref 0.8–1.2)
Prothrombin Time: 70.9 s — ABNORMAL HIGH (ref 9.1–12.0)

## 2017-09-20 LAB — POCT INR: INR: 7.8 — AB (ref 2.0–3.0)

## 2017-09-27 DIAGNOSIS — R350 Frequency of micturition: Secondary | ICD-10-CM | POA: Diagnosis not present

## 2017-09-27 DIAGNOSIS — J309 Allergic rhinitis, unspecified: Secondary | ICD-10-CM | POA: Diagnosis not present

## 2017-09-27 DIAGNOSIS — J449 Chronic obstructive pulmonary disease, unspecified: Secondary | ICD-10-CM | POA: Diagnosis not present

## 2017-09-27 DIAGNOSIS — Z0000 Encounter for general adult medical examination without abnormal findings: Secondary | ICD-10-CM | POA: Diagnosis not present

## 2017-09-27 DIAGNOSIS — G894 Chronic pain syndrome: Secondary | ICD-10-CM | POA: Diagnosis not present

## 2017-09-27 DIAGNOSIS — E785 Hyperlipidemia, unspecified: Secondary | ICD-10-CM | POA: Diagnosis not present

## 2017-09-27 DIAGNOSIS — I2510 Atherosclerotic heart disease of native coronary artery without angina pectoris: Secondary | ICD-10-CM | POA: Diagnosis not present

## 2017-09-27 DIAGNOSIS — Z87891 Personal history of nicotine dependence: Secondary | ICD-10-CM | POA: Diagnosis not present

## 2017-09-27 DIAGNOSIS — E669 Obesity, unspecified: Secondary | ICD-10-CM | POA: Diagnosis not present

## 2017-09-27 DIAGNOSIS — K219 Gastro-esophageal reflux disease without esophagitis: Secondary | ICD-10-CM | POA: Diagnosis not present

## 2017-09-27 DIAGNOSIS — Z7901 Long term (current) use of anticoagulants: Secondary | ICD-10-CM | POA: Diagnosis not present

## 2017-09-27 DIAGNOSIS — I119 Hypertensive heart disease without heart failure: Secondary | ICD-10-CM | POA: Diagnosis not present

## 2017-09-30 DIAGNOSIS — M25552 Pain in left hip: Secondary | ICD-10-CM | POA: Diagnosis not present

## 2017-09-30 DIAGNOSIS — M25551 Pain in right hip: Secondary | ICD-10-CM | POA: Diagnosis not present

## 2017-09-30 DIAGNOSIS — M545 Low back pain: Secondary | ICD-10-CM | POA: Diagnosis not present

## 2017-09-30 DIAGNOSIS — M25561 Pain in right knee: Secondary | ICD-10-CM | POA: Diagnosis not present

## 2017-09-30 DIAGNOSIS — M25562 Pain in left knee: Secondary | ICD-10-CM | POA: Diagnosis not present

## 2017-09-30 DIAGNOSIS — M25511 Pain in right shoulder: Secondary | ICD-10-CM | POA: Diagnosis not present

## 2017-09-30 DIAGNOSIS — G894 Chronic pain syndrome: Secondary | ICD-10-CM | POA: Diagnosis not present

## 2017-09-30 DIAGNOSIS — M25512 Pain in left shoulder: Secondary | ICD-10-CM | POA: Diagnosis not present

## 2017-10-02 ENCOUNTER — Other Ambulatory Visit: Payer: Self-pay | Admitting: Cardiology

## 2017-10-07 ENCOUNTER — Ambulatory Visit (INDEPENDENT_AMBULATORY_CARE_PROVIDER_SITE_OTHER): Payer: Medicare Other | Admitting: *Deleted

## 2017-10-07 ENCOUNTER — Encounter: Payer: Self-pay | Admitting: Cardiology

## 2017-10-07 ENCOUNTER — Ambulatory Visit (INDEPENDENT_AMBULATORY_CARE_PROVIDER_SITE_OTHER): Payer: Medicare Other | Admitting: Cardiology

## 2017-10-07 VITALS — BP 110/70 | HR 56 | Ht 64.0 in | Wt 214.4 lb

## 2017-10-07 DIAGNOSIS — Z720 Tobacco use: Secondary | ICD-10-CM

## 2017-10-07 DIAGNOSIS — Z86711 Personal history of pulmonary embolism: Secondary | ICD-10-CM | POA: Diagnosis not present

## 2017-10-07 DIAGNOSIS — I2699 Other pulmonary embolism without acute cor pulmonale: Secondary | ICD-10-CM | POA: Diagnosis not present

## 2017-10-07 DIAGNOSIS — F17210 Nicotine dependence, cigarettes, uncomplicated: Secondary | ICD-10-CM

## 2017-10-07 DIAGNOSIS — I2510 Atherosclerotic heart disease of native coronary artery without angina pectoris: Secondary | ICD-10-CM

## 2017-10-07 DIAGNOSIS — I10 Essential (primary) hypertension: Secondary | ICD-10-CM

## 2017-10-07 DIAGNOSIS — Z5181 Encounter for therapeutic drug level monitoring: Secondary | ICD-10-CM

## 2017-10-07 DIAGNOSIS — Z7901 Long term (current) use of anticoagulants: Secondary | ICD-10-CM | POA: Diagnosis not present

## 2017-10-07 DIAGNOSIS — I259 Chronic ischemic heart disease, unspecified: Secondary | ICD-10-CM | POA: Diagnosis not present

## 2017-10-07 LAB — POCT INR: INR: 3.2 — AB (ref 2.0–3.0)

## 2017-10-07 MED ORDER — BUPROPION HCL ER (SMOKING DET) 150 MG PO TB12: 150.0000 mg | ORAL_TABLET | ORAL | 3 refills | Status: DC

## 2017-10-07 NOTE — Patient Instructions (Signed)
Medication Instructions:  Continue medications as listed. You may take Zyban (Wellbutrin) 150 mg once a day for 7 days then twice a day starting day 8.  Stop smoking by day 8.  Follow-Up: Follow up in 6 months with Dr. Anne Fu.  You will receive a letter in the mail 2 months before you are due.  Please call us when you receive this letter to schedule your follow up appointment.  If you need a refill on your cardiac medications before your next appointment, please call your pharmacy.  Thank you for choosing [facility]!!     Coping with Quitting Smoking Quitting smoking is a physical and mental challenge. You will face cravings, withdrawal symptoms, and temptation. Before quitting, work with your health care provider to make a plan that can help you cope. Preparation can help you quit and keep you from giving in. How can I cope with cravings? Cravings usually last for 5-10 minutes. If you get through it, the craving will pass. Consider taking the following actions to help you cope with cravings:  Keep your mouth busy: ? Chew sugar-free gum. ? Suck on hard candies or a straw. ? Brush your teeth.  Keep your hands and body busy: ? Immediately change to a different activity when you feel a craving. ? Squeeze or play with a ball. ? Do an activity or a hobby, like making bead jewelry, practicing needlepoint, or working with wood. ? Mix up your normal routine. ? Take a short exercise break. Go for a quick walk or run up and down stairs. ? Spend time in public places where smoking is not allowed.  Focus on doing something kind or helpful for someone else.  Call a friend or family member to talk during a craving.  Join a support group.  Call a quit line, such as 1-800-QUIT-NOW.  Talk with your health care provider about medicines that might help you cope with cravings and make quitting easier for you.  How can I deal with withdrawal symptoms? Your body may experience  negative effects as it tries to get used to not having nicotine in the system. These effects are called withdrawal symptoms. They may include:  Feeling hungrier than normal.  Trouble concentrating.  Irritability.  Trouble sleeping.  Feeling depressed.  Restlessness and agitation.  Craving a cigarette.  To manage withdrawal symptoms:  Avoid places, people, and activities that trigger your cravings.  Remember why you want to quit.  Get plenty of sleep.  Avoid coffee and other caffeinated drinks. These may worsen some of your symptoms.  How can I handle social situations? Social situations can be difficult when you are quitting smoking, especially in the first few weeks. To manage this, you can:  Avoid parties, bars, and other social situations where people might be smoking.  Avoid alcohol.  Leave right away if you have the urge to smoke.  Explain to your family and friends that you are quitting smoking. Ask for understanding and support.  Plan activities with friends or family where smoking is not an option.  What are some ways I can cope with stress? Wanting to smoke may cause stress, and stress can make you want to smoke. Find ways to manage your stress. Relaxation techniques can help. For example:  Breathe slowly and deeply, in through your nose and out through your mouth.  Listen to soothing, relaxing music.  Talk with a family member or friend about your stress.  Light a candle.  Soak in  a bath or take a shower.  Think about a peaceful place.  What are some ways I can prevent weight gain? Be aware that many people gain weight after they quit smoking. However, not everyone does. To keep from gaining weight, have a plan in place before you quit and stick to the plan after you quit. Your plan should include:  Having healthy snacks. When you have a craving, it may help to: ? Eat plain popcorn, crunchy carrots, celery, or other cut vegetables. ? Chew  sugar-free gum.  Changing how you eat: ? Eat small portion sizes at meals. ? Eat 4-6 small meals throughout the day instead of 1-2 large meals a day. ? Be mindful when you eat. Do not watch television or do other things that might distract you as you eat.  Exercising regularly: ? Make time to exercise each day. If you do not have time for a long workout, do short bouts of exercise for 5-10 minutes several times a day. ? Do some form of strengthening exercise, like weight lifting, and some form of aerobic exercise, like running or swimming.  Drinking plenty of water or other low-calorie or no-calorie drinks. Drink 6-8 glasses of water daily, or as much as instructed by your health care provider.  Summary  Quitting smoking is a physical and mental challenge. You will face cravings, withdrawal symptoms, and temptation to smoke again. Preparation can help you as you go through these challenges.  You can cope with cravings by keeping your mouth busy (such as by chewing gum), keeping your body and hands busy, and making calls to family, friends, or a helpline for people who want to quit smoking.  You can cope with withdrawal symptoms by avoiding places where people smoke, avoiding drinks with caffeine, and getting plenty of rest.  Ask your health care provider about the different ways to prevent weight gain, avoid stress, and handle social situations. This information is not intended to replace advice given to you by your health care provider. Make sure you discuss any questions you have with your health care provider. Document Released: 01/31/2016 Document Revised: 01/31/2016 Document Reviewed: 01/31/2016 Elsevier Interactive Patient Education  [employer]2018 [employer]

## 2017-10-07 NOTE — Patient Instructions (Signed)
Description   Do not take any Coumadin today then start taking 1/2 tablet daily except 1 tablet on Mondays.  Recheck INR in 2 weeks.  Call us any concerns at  Main [phone] Coumadin Clinic [phone].

## 2017-10-07 NOTE — Progress Notes (Signed)
Cardiology Office Note:    Date:  10/07/2017   ID:  Amy Rivers, DOB 01/15/1966, MRN [id number]  PCP:  Jackie Plumsei-Bonsu, George, MD  Cardiologist:  Donato SchultzMark Amairani Shuey, MD    Referring MD: Jackie Plumsei-Bonsu, George, MD     History of Present Illness:    Amy Rivers is a 52 y.o. 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 In review of prior office notes, she had an episode of chest discomfort that was concerning to her like a blood clot was happening again ". CT scan of chest did not show any evidence of thrombosis.  Overall she has been doing fairly well. She has some fatigue with exertion. Denies any recent anginal symptoms. She's been on Coumadin for years she states.  She also states that she has a history of congestive heart failure.   She states that her Breathing is so so. CABG  LDL 132  Tired with ambulation. No CP, no syncope.  [place].  Tob use-continues to smoke. She was previously diagnosed with COPD but most recent exam by her primary physician was to the contrary.   10/07/2017-since her last visit she did have a nuclear stress test on 06/16/2017 which was low risk, no ischemia.  Normal ejection fraction.  Remains on Coumadin.  Try to one point to switch her to Eliquis but this was unsuccessful. Wants to swim. Wants help to stop smoking.  We will try Wellbutrin.  Currently denies any chest discomfort.  She did have some atypical chest pain previously.  Denies any fevers chills nausea vomiting syncope bleeding.   Past Medical History:  Diagnosis Date  . Allergic rhinitis   . Anticoagulant long-term use   . Bipolar 1 disorder (HCC)   . CAD (coronary artery disease), native coronary artery    ANGINA PRESENCE UNSPECIFIED  . CHF (congestive heart failure) (HCC)   . COPD (chronic obstructive pulmonary disease)  (HCC)   . DDD (degenerative disc disease), lumbar   . Fibromyalgia   . Fibromyalgia   . GERD (gastroesophageal reflux disease)   . H/O degenerative disc disease   . Hx of tobacco use, presenting hazards to health   . Hyperlipidemia   . Hypertension   . Low back pain   . Obesity (BMI 30.0-34.9)   . Other chronic pain   . Pulmonary embolism (HCC)   . Scoliosis     Past Surgical History:  Procedure Laterality Date  . ABDOMINAL HYSTERECTOMY    . ABDOMINAL SURGERY    . CARDIAC SURGERY      Current Medications: Current Meds  Medication Sig  . atorvastatin (LIPITOR) 80 MG tablet Take 80 mg by mouth at bedtime.  . gabapentin (NEURONTIN) 300 MG capsule Take 300 mg by mouth 2 (two) times daily.  Marland Kitchen. ibuprofen (ADVIL,MOTRIN) 200 MG tablet Take 400 mg by mouth every 6 (six) hours as needed for moderate pain.  . Melatonin 5 MG CHEW Chew 5 mg by mouth at bedtime.  . metoprolol tartrate (LOPRESSOR) 25 MG tablet Take 50 mg by mouth 2 (two) times daily.   . nitroGLYCERIN (NITROSTAT) 0.4 MG SL tablet Place 1 tablet (0.4 mg total) under the tongue every 5 (five) minutes x 3 doses as needed for chest pain.  Marland Kitchen. OLANZapine (ZYPREXA) 20 MG tablet Take 20 mg by mouth at bedtime.  . pantoprazole (PROTONIX)  40 MG tablet Take 40 mg by mouth daily before breakfast.  . tiZANidine (ZANAFLEX) 4 MG tablet TAKE 1 TABLET BY MOUTH THREE TIMES A DAY IF NEEDED  . topiramate (TOPAMAX) 25 MG tablet Take 25 mg by mouth at bedtime.  . traMADol (ULTRAM) 50 MG tablet TK 1 T PO  Q 8 H  . VENTOLIN HFA 108 (90 Base) MCG/ACT inhaler Inhale 2 puffs into the lungs 2 (two) times daily.   Marland Kitchen warfarin (COUMADIN) 5 MG tablet TAKE 1 TABLET BY MOUTH DAILY AS DIRECTED BY COUMADIN CLINIC     Allergies:   Keflet [cephalexin]; Morphine and related; Sulfa antibiotics; Iodine; Strawberry extract; and Cymbalta [duloxetine hcl]   Social History   Socioeconomic History  . Marital status: Widowed    Spouse name: Not on file  . Number of  children: Not on file  . Years of education: Not on file  . Highest education level: Not on file  Occupational History  . Not on file  Social Needs  . Financial resource strain: Not on file  . Food insecurity:    Worry: Not on file    Inability: Not on file  . Transportation needs:    Medical: Not on file    Non-medical: Not on file  Tobacco Use  . Smoking status: Current Every Day Smoker  . Smokeless tobacco: Never Used  Substance and Sexual Activity  . Alcohol use: No  . Drug use: No  . Sexual activity: Not on file  Lifestyle  . Physical activity:    Days per week: Not on file    Minutes per session: Not on file  . Stress: Not on file  Relationships  . Social connections:    Talks on phone: Not on file    Gets together: Not on file    Attends religious service: Not on file    Active member of club or organization: Not on file    Attends meetings of clubs or organizations: Not on file    Relationship status: Not on file  Other Topics Concern  . Not on file  Social History Narrative  . Not on file     Family History: The patient's family history includes Arthritis in her father; Hepatitis in her mother. ROS:   Please see the history of present illness.    All others are negative  EKGs/Labs/Other Studies Reviewed:    The following studies were reviewed today: Prior office, lab work, EKG reviewed  Nuclear stress test 06/16/2017:  Nuclear stress EF: 57%.  During infusion there was accentuation of the T wave abnormalities noted on baseline EKG  The study is normal.  This is a low risk study.  The left ventricular ejection fraction is normal (55-65%).  EKG:  EKG is  ordered today.  The ekg ordered today demonstrates sinus rhythm with T-wave inversion in the anterior precordial leads, cannot exclude anterior ischemia. Similar EKG described previously. Personally viewed.  Recent Labs: 06/03/2017: ALT 11; BUN 8; Creatinine, Ser 0.88; Hemoglobin 12.0; Platelets  298; Potassium 4.4; Sodium 144  Recent Lipid Panel    Component Value Date/Time   CHOL 139 06/03/2017 1456   TRIG 88 06/03/2017 1456   HDL 47 06/03/2017 1456   CHOLHDL 3.0 06/03/2017 1456   LDLCALC 74 06/03/2017 1456    Physical Exam:    VS:  BP 110/70   Pulse (!) 56   Ht 5\' 4"  (1.626 m)   Wt 214 lb 6.4 oz (97.3 kg)   LMP  (  LMP Unknown)   SpO2 98%   BMI 36.80 kg/m     Wt Readings from Last 3 Encounters:  10/07/17 214 lb 6.4 oz (97.3 kg)  06/16/17 204 lb (92.5 kg)  06/03/17 204 lb (92.5 kg)    GEN: Well nourished, well developed, in no acute distress  HEENT: normal  Neck: no JVD, carotid bruits, or masses Cardiac: RRR; no murmurs, rubs, or gallops,no edema  Respiratory:  clear to auscultation bilaterally, normal work of breathing GI: soft, nontender, nondistended, + BS MS: no deformity or atrophy  Skin: warm and dry, no rash Neuro:  Alert and Oriented x 3, Strength and sensation are intact Psych: euthymic mood, full affect    ASSESSMENT:    1. History of pulmonary embolism   2. Tobacco abuse   3. Essential hypertension   4. Chronic anticoagulation   5. Coronary artery disease involving native coronary artery of native heart without angina pectoris    PLAN:    In order of problems listed above:  Coronary artery disease status post CABG 1 -Atorvastatin, metoprolol. -Currently not having any anginal symptoms.  Continue to encourage secondary prevention.  Reassurance has been given after her stress test in May 2019, low risk.  Occasional atypical chest pain.  History of recurrent pulmonary embolism - Previously had tried to switch her over to Eliquis but she is back on Coumadin. -Creatinine 0.97, hemoglobin 12.3  Hyperlipidemia -LDL 132-optimally would like less than 100 even less than 70. Continue with atorvastatin.  Abnormal EKG -T-wave inversions noted in the anterior precordial leads as was seen on prior EKG. This may be an old finding for her. We will  check an echocardiogram to ensure proper structure and function.  Essential hypertension -Continue to monitor.  Tobacco use -Tobacco cessation discussed. Zyban 150 daily for 1 week then twice daily for up to 12 weeks.  She should be off of her cigarettes by the time she increases to twice a day.  We discussed smoking cessation tactics for greater than 5 minutes during this visit.  Medication Adjustments/Labs and Tests Ordered: Current medicines are reviewed at length with the patient today.  Concerns regarding medicines are outlined above.  No orders of the defined types were placed in this encounter.  Meds ordered this encounter  Medications  . buPROPion (ZYBAN) 150 MG 12 hr tablet    Sig: Take 1 tablet (150 mg total) by mouth as directed. Take 1 tab once daily for 7 days then take 1 tablet twice a day starting day 8    Dispense:  60 tablet    Refill:  3    Signed, Donato Schultz, MD  10/07/2017 2:17 PM    [facility]

## 2017-10-09 ENCOUNTER — Other Ambulatory Visit: Payer: Self-pay | Admitting: Cardiology

## 2017-10-21 ENCOUNTER — Encounter (INDEPENDENT_AMBULATORY_CARE_PROVIDER_SITE_OTHER): Payer: Self-pay

## 2017-10-21 ENCOUNTER — Ambulatory Visit (INDEPENDENT_AMBULATORY_CARE_PROVIDER_SITE_OTHER): Payer: 59 | Admitting: *Deleted

## 2017-10-21 DIAGNOSIS — I2699 Other pulmonary embolism without acute cor pulmonale: Secondary | ICD-10-CM

## 2017-10-21 DIAGNOSIS — Z5181 Encounter for therapeutic drug level monitoring: Secondary | ICD-10-CM

## 2017-10-21 LAB — POCT INR: INR: 3.6 — AB (ref 2.0–3.0)

## 2017-10-21 NOTE — Patient Instructions (Signed)
Description   Do not take any Coumadin today then start taking 1/2 tablet daily. Recheck INR in 2 weeks.  Call us any concerns at  Main [phone] [facility] [phone]. Start eating 2 servings each week of leafy green vegetable.

## 2017-11-04 ENCOUNTER — Ambulatory Visit (INDEPENDENT_AMBULATORY_CARE_PROVIDER_SITE_OTHER): Payer: 59 | Admitting: *Deleted

## 2017-11-04 DIAGNOSIS — Z5181 Encounter for therapeutic drug level monitoring: Secondary | ICD-10-CM

## 2017-11-04 DIAGNOSIS — I2699 Other pulmonary embolism without acute cor pulmonale: Secondary | ICD-10-CM

## 2017-11-04 LAB — POCT INR: INR: 1.3 — AB (ref 2.0–3.0)

## 2017-11-04 NOTE — Patient Instructions (Signed)
Description   Today and tomorrow take 1 tablet then continue taking 1/2 tablet daily. Recheck INR in 1 week.  Call us any concerns at  Main [phone] [facility] [phone]. Continue eating 2 servings each week of leafy green vegetable.

## 2017-11-11 ENCOUNTER — Ambulatory Visit (INDEPENDENT_AMBULATORY_CARE_PROVIDER_SITE_OTHER): Payer: 59

## 2017-11-11 DIAGNOSIS — I2699 Other pulmonary embolism without acute cor pulmonale: Secondary | ICD-10-CM

## 2017-11-11 DIAGNOSIS — Z5181 Encounter for therapeutic drug level monitoring: Secondary | ICD-10-CM

## 2017-11-11 LAB — POCT INR: INR: 2.1 (ref 2.0–3.0)

## 2017-11-11 NOTE — Patient Instructions (Signed)
Description   Continue taking 1/2 tablet daily. Recheck INR in 3 weeks.  Call us any concerns at  Main [phone] [facility] [phone]. Continue eating 2 servings each week of leafy green vegetable.

## 2017-11-22 DIAGNOSIS — E669 Obesity, unspecified: Secondary | ICD-10-CM | POA: Diagnosis not present

## 2017-11-22 DIAGNOSIS — Z131 Encounter for screening for diabetes mellitus: Secondary | ICD-10-CM | POA: Diagnosis not present

## 2017-11-22 DIAGNOSIS — I2510 Atherosclerotic heart disease of native coronary artery without angina pectoris: Secondary | ICD-10-CM | POA: Diagnosis not present

## 2017-11-22 DIAGNOSIS — R350 Frequency of micturition: Secondary | ICD-10-CM | POA: Diagnosis not present

## 2017-11-22 DIAGNOSIS — I208 Other forms of angina pectoris: Secondary | ICD-10-CM | POA: Diagnosis not present

## 2017-11-22 DIAGNOSIS — K219 Gastro-esophageal reflux disease without esophagitis: Secondary | ICD-10-CM | POA: Diagnosis not present

## 2017-11-22 DIAGNOSIS — Z7901 Long term (current) use of anticoagulants: Secondary | ICD-10-CM | POA: Diagnosis not present

## 2017-11-22 DIAGNOSIS — I119 Hypertensive heart disease without heart failure: Secondary | ICD-10-CM | POA: Diagnosis not present

## 2017-11-22 DIAGNOSIS — R5383 Other fatigue: Secondary | ICD-10-CM | POA: Diagnosis not present

## 2017-11-22 DIAGNOSIS — J449 Chronic obstructive pulmonary disease, unspecified: Secondary | ICD-10-CM | POA: Diagnosis not present

## 2017-11-22 DIAGNOSIS — E785 Hyperlipidemia, unspecified: Secondary | ICD-10-CM | POA: Diagnosis not present

## 2017-11-22 DIAGNOSIS — Z5181 Encounter for therapeutic drug level monitoring: Secondary | ICD-10-CM | POA: Diagnosis not present

## 2017-11-23 DIAGNOSIS — R55 Syncope and collapse: Secondary | ICD-10-CM | POA: Diagnosis not present

## 2017-11-23 DIAGNOSIS — F319 Bipolar disorder, unspecified: Secondary | ICD-10-CM | POA: Diagnosis not present

## 2017-11-23 DIAGNOSIS — R413 Other amnesia: Secondary | ICD-10-CM | POA: Diagnosis not present

## 2017-11-23 DIAGNOSIS — G4710 Hypersomnia, unspecified: Secondary | ICD-10-CM | POA: Diagnosis not present

## 2017-12-02 ENCOUNTER — Ambulatory Visit (INDEPENDENT_AMBULATORY_CARE_PROVIDER_SITE_OTHER): Payer: 59 | Admitting: *Deleted

## 2017-12-02 DIAGNOSIS — I2699 Other pulmonary embolism without acute cor pulmonale: Secondary | ICD-10-CM

## 2017-12-02 DIAGNOSIS — Z5181 Encounter for therapeutic drug level monitoring: Secondary | ICD-10-CM

## 2017-12-02 LAB — POCT INR: INR: 2.3 (ref 2.0–3.0)

## 2017-12-02 NOTE — Patient Instructions (Signed)
Description   Continue taking the dose you have been taking which is 1/2 tablet daily except 1 tablet on Sundays. Recheck INR in 4 weeks.  Call us any concerns at  Main [phone] [facility] [phone]. Continue eating 2 servings each week of leafy green vegetable.      

## 2017-12-07 DIAGNOSIS — I119 Hypertensive heart disease without heart failure: Secondary | ICD-10-CM | POA: Diagnosis not present

## 2017-12-07 DIAGNOSIS — M79602 Pain in left arm: Secondary | ICD-10-CM | POA: Diagnosis not present

## 2017-12-07 DIAGNOSIS — T148XXA Other injury of unspecified body region, initial encounter: Secondary | ICD-10-CM | POA: Diagnosis not present

## 2017-12-07 DIAGNOSIS — R350 Frequency of micturition: Secondary | ICD-10-CM | POA: Diagnosis not present

## 2017-12-07 DIAGNOSIS — J449 Chronic obstructive pulmonary disease, unspecified: Secondary | ICD-10-CM | POA: Diagnosis not present

## 2017-12-07 DIAGNOSIS — E785 Hyperlipidemia, unspecified: Secondary | ICD-10-CM | POA: Diagnosis not present

## 2017-12-07 DIAGNOSIS — E669 Obesity, unspecified: Secondary | ICD-10-CM | POA: Diagnosis not present

## 2017-12-07 DIAGNOSIS — Z7901 Long term (current) use of anticoagulants: Secondary | ICD-10-CM | POA: Diagnosis not present

## 2017-12-07 DIAGNOSIS — K219 Gastro-esophageal reflux disease without esophagitis: Secondary | ICD-10-CM | POA: Diagnosis not present

## 2017-12-07 DIAGNOSIS — I2510 Atherosclerotic heart disease of native coronary artery without angina pectoris: Secondary | ICD-10-CM | POA: Diagnosis not present

## 2017-12-09 ENCOUNTER — Other Ambulatory Visit: Payer: Self-pay | Admitting: Neurology

## 2017-12-09 DIAGNOSIS — R569 Unspecified convulsions: Secondary | ICD-10-CM

## 2017-12-30 ENCOUNTER — Ambulatory Visit (INDEPENDENT_AMBULATORY_CARE_PROVIDER_SITE_OTHER): Payer: 59

## 2017-12-30 ENCOUNTER — Other Ambulatory Visit: Payer: Self-pay | Admitting: Cardiology

## 2017-12-30 DIAGNOSIS — I2699 Other pulmonary embolism without acute cor pulmonale: Secondary | ICD-10-CM

## 2017-12-30 DIAGNOSIS — Z5181 Encounter for therapeutic drug level monitoring: Secondary | ICD-10-CM

## 2017-12-30 LAB — POCT INR: INR: 2 (ref 2.0–3.0)

## 2017-12-30 MED ORDER — WARFARIN SODIUM 5 MG PO TABS: ORAL_TABLET | ORAL | 1 refills | Status: DC

## 2017-12-30 NOTE — Patient Instructions (Signed)
Description   Continue taking the dose you have been taking which is 1/2 tablet daily except 1 tablet on Sundays. Recheck INR in 4 weeks.  Call us any concerns at  Main [phone] [facility] [phone]. Continue eating 2 servings each week of leafy green vegetable.      

## 2018-02-01 ENCOUNTER — Ambulatory Visit: Payer: 59 | Admitting: Cardiology

## 2018-02-02 ENCOUNTER — Encounter: Payer: Self-pay | Admitting: Cardiology

## 2018-02-03 ENCOUNTER — Ambulatory Visit (HOSPITAL_COMMUNITY): Payer: 59

## 2018-03-14 ENCOUNTER — Ambulatory Visit (INDEPENDENT_AMBULATORY_CARE_PROVIDER_SITE_OTHER): Payer: 59

## 2018-03-14 ENCOUNTER — Encounter (INDEPENDENT_AMBULATORY_CARE_PROVIDER_SITE_OTHER): Payer: Self-pay

## 2018-03-14 DIAGNOSIS — I2699 Other pulmonary embolism without acute cor pulmonale: Secondary | ICD-10-CM | POA: Diagnosis not present

## 2018-03-14 DIAGNOSIS — Z5181 Encounter for therapeutic drug level monitoring: Secondary | ICD-10-CM

## 2018-03-14 LAB — POCT INR: INR: 2.8 (ref 2.0–3.0)

## 2018-03-14 NOTE — Patient Instructions (Signed)
Description   Continue taking the dose you have been taking which is 1/2 tablet daily except 1 tablet on Sundays. Recheck INR in 4 weeks.  Call us any concerns at  Main [phone] [facility] [phone]. Continue eating 2 servings each week of leafy green vegetable.

## 2018-04-26 ENCOUNTER — Ambulatory Visit (INDEPENDENT_AMBULATORY_CARE_PROVIDER_SITE_OTHER): Payer: 59

## 2018-04-26 DIAGNOSIS — I2699 Other pulmonary embolism without acute cor pulmonale: Secondary | ICD-10-CM

## 2018-04-26 DIAGNOSIS — Z5181 Encounter for therapeutic drug level monitoring: Secondary | ICD-10-CM

## 2018-04-26 LAB — POCT INR: INR: 1.7 — AB (ref 2.0–3.0)

## 2018-04-26 NOTE — Patient Instructions (Signed)
Description   Take 1 tablet today, then start taking 1/2 tablet daily except 1 tablet on Sundays and Thursdays. Recheck INR in 3 weeks.  Call us any concerns at  Main [phone] [facility] [phone]. Continue eating 2 servings each week of leafy green vegetable.

## 2018-05-05 ENCOUNTER — Other Ambulatory Visit: Payer: Self-pay | Admitting: Cardiology

## 2018-05-05 ENCOUNTER — Telehealth: Payer: Self-pay | Admitting: Cardiology

## 2018-05-05 MED ORDER — METOPROLOL TARTRATE 25 MG PO TABS: 50.0000 mg | ORAL_TABLET | Freq: Two times a day (BID) | ORAL | 0 refills | Status: DC

## 2018-05-05 MED ORDER — NITROGLYCERIN 0.4 MG SL SUBL: 0.4000 mg | SUBLINGUAL_TABLET | SUBLINGUAL | 2 refills | Status: DC | PRN

## 2018-05-05 NOTE — Telephone Encounter (Signed)
Attempted to contact pt for COVID screening.  Pt has appt with Dr. Skains on 05/12/2018. Left message to call back.  

## 2018-05-05 NOTE — Telephone Encounter (Signed)
Pt contacted. History reviewed.  No symptoms to suggest any unstable cardiac conditions.  Based on discussion, with current pandemic siutation, we will be postponing this appt.  Pt has been informed that if symptoms change, please contact the office.    Routing to C19 CANCEL pool

## 2018-05-12 ENCOUNTER — Ambulatory Visit: Payer: 59 | Admitting: Cardiology

## 2018-05-16 ENCOUNTER — Telehealth: Payer: Self-pay

## 2018-05-16 NOTE — Telephone Encounter (Signed)
Pt rides the bus. What should she do in regards to testing?

## 2018-05-16 NOTE — Telephone Encounter (Signed)
LMOM FOR PRESCREEN/DRIVE THRU 

## 2018-05-17 ENCOUNTER — Ambulatory Visit (INDEPENDENT_AMBULATORY_CARE_PROVIDER_SITE_OTHER): Payer: 59 | Admitting: Pharmacist

## 2018-05-17 ENCOUNTER — Other Ambulatory Visit: Payer: Self-pay

## 2018-05-17 DIAGNOSIS — Z5181 Encounter for therapeutic drug level monitoring: Secondary | ICD-10-CM | POA: Diagnosis not present

## 2018-05-17 DIAGNOSIS — I2699 Other pulmonary embolism without acute cor pulmonale: Secondary | ICD-10-CM

## 2018-05-17 LAB — POCT INR: INR: 2.4 (ref 2.0–3.0)

## 2018-05-17 MED ORDER — WARFARIN SODIUM 5 MG PO TABS: ORAL_TABLET | ORAL | 0 refills | Status: DC

## 2018-06-03 ENCOUNTER — Telehealth: Payer: Self-pay | Admitting: *Deleted

## 2018-06-03 NOTE — Telephone Encounter (Signed)
Pt has been scheduled for virtual phone visit 4/21.  Verbal consent obtained. She phone note from 4/17 for further information.

## 2018-06-03 NOTE — Telephone Encounter (Signed)
Left message for pt to c/b to schedule her 6 month Virtual Appt with Dr Anne Fu to f/u CAD/HTN and HX of PE. - appt can either be phone or Doximity if she has a smart phone.

## 2018-06-03 NOTE — Telephone Encounter (Signed)
YOUR CARDIOLOGY TEAM HAS ARRANGED FOR AN E-VISIT FOR YOUR APPOINTMENT - PLEASE REVIEW IMPORTANT INFORMATION BELOW SEVERAL DAYS PRIOR TO YOUR APPOINTMENT  Due to the recent COVID-19 pandemic, we are transitioning in-person office visits to tele-medicine visits in an effort to decrease unnecessary exposure to our patients, their families, and staff. These visits are billed to your insurance just like a normal visit is. We also encourage you to sign up for MyChart if you have not already done so. You will need a smartphone if possible. For patients that do not have this, we can still complete the visit using a regular telephone but do prefer a smartphone to enable video when possible. You may have a family member that lives with you that can help. If possible, we also ask that you have a blood pressure cuff and scale at home to measure your blood pressure, heart rate and weight prior to your scheduled appointment. Patients with clinical needs that need an in-person evaluation and testing will still be able to come to the office if absolutely necessary. If you have any questions, feel free to call our office.     YOUR PROVIDER WILL BE USING THE FOLLOWING PLATFORM TO COMPLETE YOUR VISIT:  Phone   . IF USING DOXIMITY or [email or web address].[email or web address] - The staff will give you instructions on receiving your link to join the meeting the day of your visit.   2-3 DAYS BEFORE YOUR APPOINTMENT  You will receive a telephone call from one of our [facility] team members - your caller ID may say "Unknown caller." If this is a video visit, we will walk you through how to set up your device to be able to complete the visit. We will remind you check your blood pressure, heart rate and weight prior to your scheduled appointment. If you have an Apple Watch or Kardia, please upload any pertinent ECG strips the day before or morning of your appointment to MyChart. Our staff will also make sure you have reviewed the consent and agree to move forward  with your scheduled tele-health visit.     THE DAY OF YOUR APPOINTMENT  Approximately 15 minutes prior to your scheduled appointment, you will receive a telephone call from one of HeartCare team - your caller ID may say "Unknown caller."  Our staff will confirm medications, vital signs for the day and any symptoms you may be experiencing. Please have this information available prior to the time of visit start. It may also be helpful for you to have a pad of paper and pen handy for any instructions given during your visit. They will also walk you through joining the smartphone meeting if this is a video visit.    CONSENT FOR TELE-HEALTH VISIT - PLEASE REVIEW  I hereby voluntarily request, consent and authorize [facility] and its employed or contracted physicians, physician assistants, nurse practitioners or other licensed health care professionals (the Practitioner), to provide me with telemedicine health care services (the "Services") as deemed necessary by the treating Practitioner. I acknowledge and consent to receive the Services by the Practitioner via telemedicine. I understand that the telemedicine visit will involve communicating with the Practitioner through live audiovisual communication technology and the disclosure of certain medical information by electronic transmission. I acknowledge that I have been given the opportunity to request an in-person assessment or other available alternative prior to the telemedicine visit and am voluntarily participating in the telemedicine visit.  I understand that I have the right to withhold or withdraw my  consent to the use of telemedicine in the course of my care at any time, without affecting my right to future care or treatment, and that the Practitioner or I may terminate the telemedicine visit at any time. I understand that I have the right to inspect all information obtained and/or recorded in the course of the telemedicine visit and may receive  copies of available information for a reasonable fee.  I understand that some of the potential risks of receiving the Services via telemedicine include:  Marland Kitchen Delay or interruption in medical evaluation due to technological equipment failure or disruption; . Information transmitted may not be sufficient (e.g. poor resolution of images) to allow for appropriate medical decision making by the Practitioner; and/or  . In rare instances, security protocols could fail, causing a breach of personal health information.  Furthermore, I acknowledge that it is my responsibility to provide information about my medical history, conditions and care that is complete and accurate to the best of my ability. I acknowledge that Practitioner's advice, recommendations, and/or decision may be based on factors not within their control, such as incomplete or inaccurate data provided by me or distortions of diagnostic images or specimens that may result from electronic transmissions. I understand that the practice of medicine is not an exact science and that Practitioner makes no warranties or guarantees regarding treatment outcomes. I acknowledge that I will receive a copy of this consent concurrently upon execution via email to the email address I last provided but may also request a printed copy by calling the office of [facility].    I understand that my insurance will be billed for this visit.   I have read or had this consent read to me. . I understand the contents of this consent, which adequately explains the benefits and risks of the Services being provided via telemedicine.  . I have been provided ample opportunity to ask questions regarding this consent and the Services and have had my questions answered to my satisfaction. . I give my informed consent for the services to be provided through the use of telemedicine in my medical care  By participating in this telemedicine visit I agree to the above.  Reviewed  with patient who states understanding.  Reports she is doing well, staying inside and washing her hands if she does go out.  She admits to feeling anxious but has been praying about things and is feeling better.

## 2018-06-06 ENCOUNTER — Telehealth: Payer: Self-pay | Admitting: Cardiology

## 2018-06-06 NOTE — Telephone Encounter (Signed)
I spoke with patient 4/17 and obtained verbal consent at that time (see phone note 4/17)

## 2018-06-06 NOTE — Telephone Encounter (Signed)
Called patient to get verbal consent for 4/21 visit and to see if she would like to get mychart activated. No answer. Ill try back after lunch

## 2018-06-07 ENCOUNTER — Encounter: Payer: Self-pay | Admitting: Cardiology

## 2018-06-07 ENCOUNTER — Telehealth (INDEPENDENT_AMBULATORY_CARE_PROVIDER_SITE_OTHER): Payer: 59 | Admitting: Cardiology

## 2018-06-07 ENCOUNTER — Other Ambulatory Visit: Payer: Self-pay

## 2018-06-07 VITALS — Ht 64.0 in | Wt 190.0 lb

## 2018-06-07 DIAGNOSIS — I2510 Atherosclerotic heart disease of native coronary artery without angina pectoris: Secondary | ICD-10-CM

## 2018-06-07 DIAGNOSIS — Z86711 Personal history of pulmonary embolism: Secondary | ICD-10-CM

## 2018-06-07 DIAGNOSIS — I209 Angina pectoris, unspecified: Secondary | ICD-10-CM | POA: Diagnosis not present

## 2018-06-07 DIAGNOSIS — I10 Essential (primary) hypertension: Secondary | ICD-10-CM

## 2018-06-07 DIAGNOSIS — Z720 Tobacco use: Secondary | ICD-10-CM

## 2018-06-07 DIAGNOSIS — Z7901 Long term (current) use of anticoagulants: Secondary | ICD-10-CM

## 2018-06-07 MED ORDER — ISOSORBIDE MONONITRATE ER 30 MG PO TB24: 30.0000 mg | ORAL_TABLET | Freq: Every day | ORAL | 11 refills | Status: DC

## 2018-06-07 NOTE — Patient Instructions (Signed)
Medication Instructions:  Please start Isosorbide 30 mg by mouth once a day. Continue all other medications as listed.  If you need a refill on your cardiac medications before your next appointment, please call your pharmacy.   Follow-Up: Follow up in 1 month with Dr Anne Fu.  Thank you for choosing [facility]!!

## 2018-06-07 NOTE — Progress Notes (Signed)
Virtual Visit via Telephone Note   This visit type was conducted due to national recommendations for restrictions regarding the COVID-19 Pandemic (e.g. social distancing) in an effort to limit this patient's exposure and mitigate transmission in our community.  Due to her co-morbid illnesses, this patient is at least at moderate risk for complications without adequate follow up.  This format is felt to be most appropriate for this patient at this time.  The patient did not have access to video technology/had technical difficulties with video requiring transitioning to audio format only (telephone).  All issues noted in this document were discussed and addressed.  No physical exam could be performed with this format.  Please refer to the patient's chart for her  consent to telehealth for [facility].   Evaluation Performed:  Follow-up visit  Date:  06/07/2018   ID:  Amy Rivers, DOB 1965/05/03, MRN [id number]  Patient Location: Home Provider Location: Home  PCP:  Jackie Plum, MD  Cardiologist:  Donato Schultz, MD  Electrophysiologist:  None   Chief Complaint:  Chest pain  History of Present Illness:    Amy Rivers is a 53 y.o. female with with coronary artery disease status post CABG x1 with recurrent pulmonary embolism on chronic Coumadin here for follow-up.  One time we were trying to switch her over to Eliquis was tried but this was unsuccessful.  Still continues to smoke.   Stopped panic, NTG now 2x week. Has not been on Imdur. Night terrors.   The patient does not have symptoms concerning for COVID-19 infection (fever, chills, cough, or new shortness of breath).    Past Medical History:  Diagnosis Date  . Allergic rhinitis   . Anticoagulant long-term use   . Bipolar 1 disorder (HCC)   . CAD (coronary artery disease), native coronary artery    ANGINA PRESENCE UNSPECIFIED  . CHF (congestive heart failure) (HCC)   . COPD (chronic obstructive pulmonary disease)  (HCC)   . DDD (degenerative disc disease), lumbar   . Fibromyalgia   . Fibromyalgia   . GERD (gastroesophageal reflux disease)   . H/O degenerative disc disease   . Hx of tobacco use, presenting hazards to health   . Hyperlipidemia   . Hypertension   . Low back pain   . Obesity (BMI 30.0-34.9)   . Other chronic pain   . Pulmonary embolism (HCC)   . Scoliosis    Past Surgical History:  Procedure Laterality Date  . ABDOMINAL HYSTERECTOMY    . ABDOMINAL SURGERY    . CARDIAC SURGERY       Current Meds  Medication Sig  . atorvastatin (LIPITOR) 80 MG tablet Take 80 mg by mouth at bedtime.  Marland Kitchen buPROPion (WELLBUTRIN SR) 150 MG 12 hr tablet Take 150 mg by mouth 2 (two) times daily.  Marland Kitchen gabapentin (NEURONTIN) 300 MG capsule Take 300 mg by mouth 2 (two) times daily.  . metoprolol tartrate (LOPRESSOR) 25 MG tablet TAKE 2 TABLETS(50 MG) BY MOUTH TWICE DAILY  . nitroGLYCERIN (NITROSTAT) 0.4 MG SL tablet Place 1 tablet (0.4 mg total) under the tongue every 5 (five) minutes x 3 doses as needed for chest pain.  . pantoprazole (PROTONIX) 40 MG tablet Take 40 mg by mouth daily before breakfast.  . topiramate (TOPAMAX) 25 MG tablet Take 25 mg by mouth at bedtime.  . VENTOLIN HFA 108 (90 Base) MCG/ACT inhaler Inhale 2 puffs into the lungs 2 (two) times daily.   Marland Kitchen warfarin (COUMADIN) 5 MG tablet Take  1/2 or 1 tablet daily as directed by Coumadin clinic     Allergies:   Keflet [cephalexin]; Morphine and related; Sulfa antibiotics; Iodine; Strawberry extract; and Cymbalta [duloxetine hcl]   Social History   Tobacco Use  . Smoking status: Current Every Day Smoker  . Smokeless tobacco: Never Used  Substance Use Topics  . Alcohol use: No  . Drug use: No     Family Hx: The patient's family history includes Arthritis in her father; Hepatitis in her mother.  ROS:   Please see the history of present illness.    No significant shortness of breath, no syncope, no bleeding.  She does have occasional  chest discomfort-taking nitroglycerin All other systems reviewed and are negative.   Prior CV studies:   The following studies were reviewed today:  Nuclear stress test 2019-low risk no ischemia  Labs/Other Tests and Data Reviewed:    EKG:  An ECG dated 06/03/2017 was personally reviewed today and demonstrated:  Sinus rhythm nonspecific T wave changes  Recent Labs: No results found for requested labs within last 8760 hours.   Recent Lipid Panel Lab Results  Component Value Date/Time   CHOL 139 06/03/2017 02:56 PM   TRIG 88 06/03/2017 02:56 PM   HDL 47 06/03/2017 02:56 PM   CHOLHDL 3.0 06/03/2017 02:56 PM   LDLCALC 74 06/03/2017 02:56 PM    Wt Readings from Last 3 Encounters:  06/07/18 190 lb (86.2 kg)  10/07/17 214 lb 6.4 oz (97.3 kg)  06/16/17 204 lb (92.5 kg)     Objective:    Vital Signs:  Ht 5\' 4"  (1.626 m)   Wt 190 lb (86.2 kg)   LMP  (LMP Unknown)   BMI 32.61 kg/m    VITAL SIGNS:  reviewed   Able to complete full sentences on telephone without difficulty.  ASSESSMENT & PLAN:    Coronary artery disease - Continue with aggressive secondary prevention, post CABG x1. -Nuclear stress test 06/16/2017 low risk no ischemia -He is still feeling some anginal symptoms and taking nitroglycerin.  We will go ahead and give her isosorbide 30 mg once a day.  She knows that this may cause a headache.  She is familiar with the headache that she gets with nitroglycerin.  History of recurrent pulmonary embolism - Coumadin.  No changes.  She has a drive-through follow-up visit in 1 week.  Hyperlipidemia - Continue with atorvastatin.  Ideally LDL less than 70  Abnormal EKG -Prior T wave inversions noted in the anterior precordial leads  Essential hypertension - Medications reviewed, continue to monitor.  Tobacco use - At last visit we gave her Zyban prescription.  Hard to quit. But trying to quit again.   COVID-19 Education: The signs and symptoms of COVID-19 were  discussed with the patient and how to seek care for testing (follow up with PCP or arrange E-visit).  The importance of social distancing was discussed today.  Time:   Today, I have spent 11 minutes with the patient with telehealth technology discussing the above problems.     Medication Adjustments/Labs and Tests Ordered: Current medicines are reviewed at length with the patient today.  Concerns regarding medicines are outlined above.   Tests Ordered: No orders of the defined types were placed in this encounter.   Medication Changes: No orders of the defined types were placed in this encounter.   Disposition:  Follow up in 1 month(s)  Signed, Donato SchultzMark Skains, MD  06/07/2018 2:58 PM    [facility]

## 2018-06-07 NOTE — Telephone Encounter (Signed)
Pt has an appointment today with Dr Anne Fu.  Will offer MyChart again however pt has declined in the past.

## 2018-06-09 ENCOUNTER — Telehealth: Payer: Self-pay | Admitting: Pharmacist

## 2018-06-09 IMAGING — CT CT CHEST W/O CM
3 of 4 series · 17 of 30 positions shown, 19 images · non-contrast
Comparison: Radiographs June 28, 2016.

CLINICAL DATA: Cough, shortness of breath, abnormal chest x-ray.

EXAM:
CT CHEST WITHOUT CONTRAST
TECHNIQUE: Multidetector CT imaging of the chest was performed following the
standard protocol without IV contrast.

[Series 3: chest w/o · axial · non-contrast · 0.76mm/px · z∈[-256,-39]mm · 6 of 123 slices shown]
[im 18/123  lung]
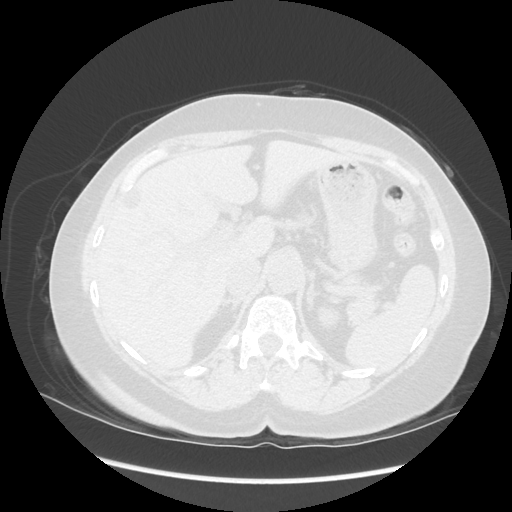
[im 35/123  lung]
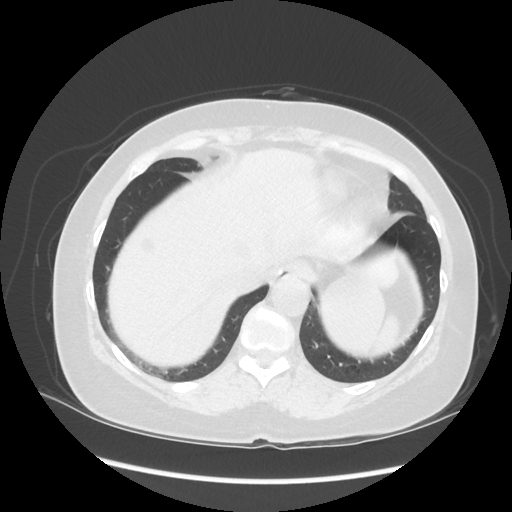
[im 53/123  lung]
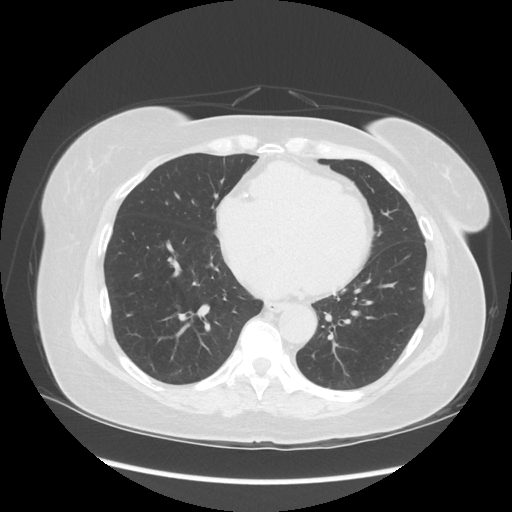
[im 70/123  lung]
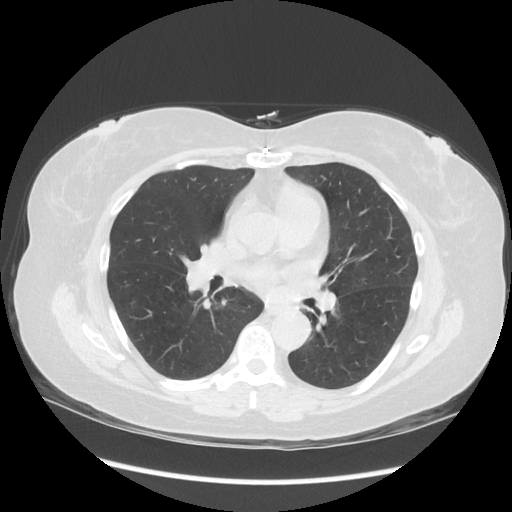
[im 88/123  lung]
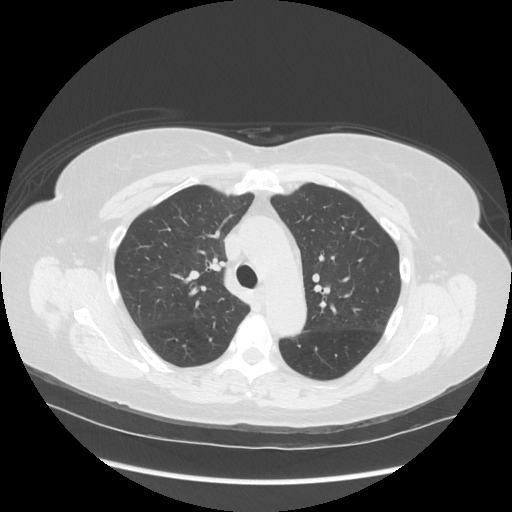
[im 105/123  lung]
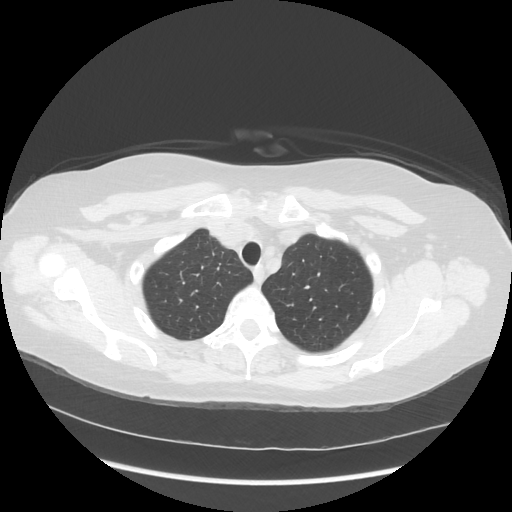

[Series 4: lung windows · axial · 0.76mm/px · z∈[-262,-34]mm · 7 of 123 slices shown, 9 images]
[im 16/123  mediastinal]
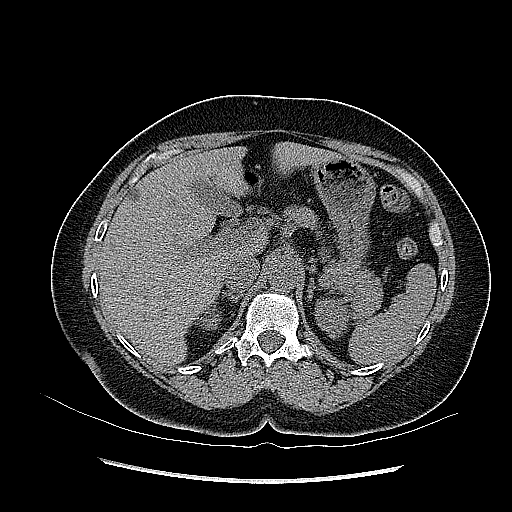
[im 16/123  lung]
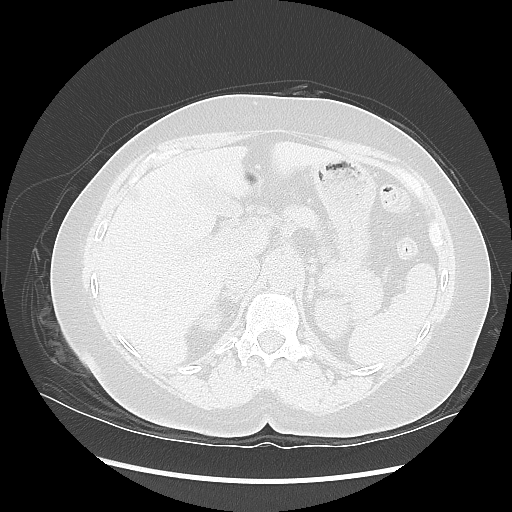
[im 31/123  lung]
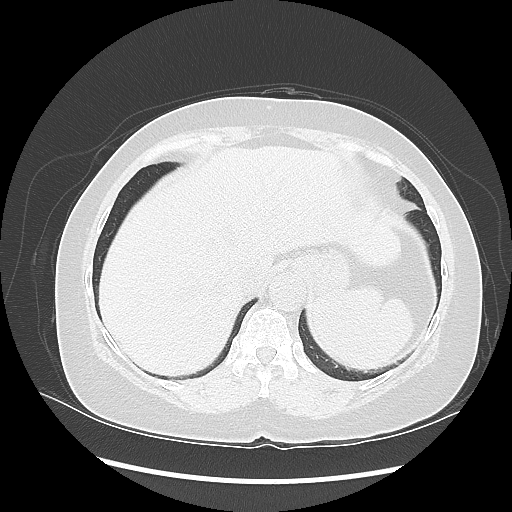
[im 46/123  lung]
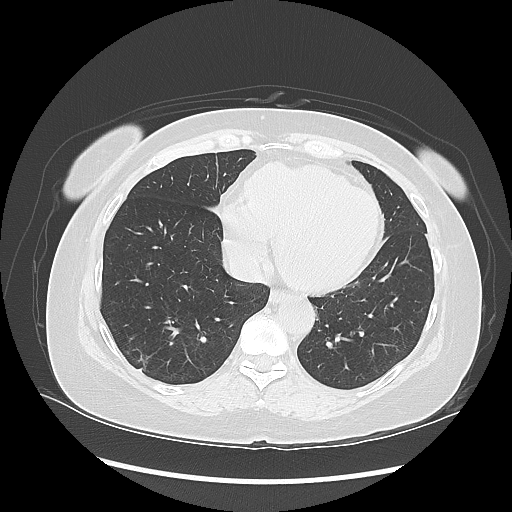
[im 62/123  lung]
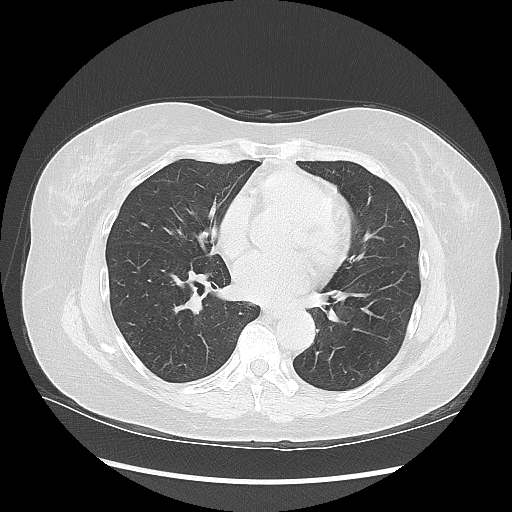
[im 77/123  mediastinal]
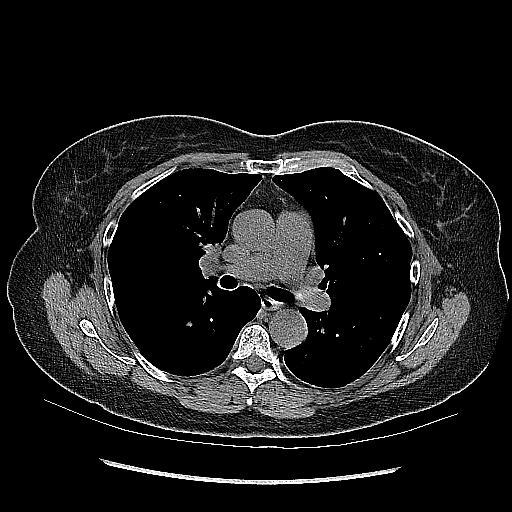
[im 77/123  lung]
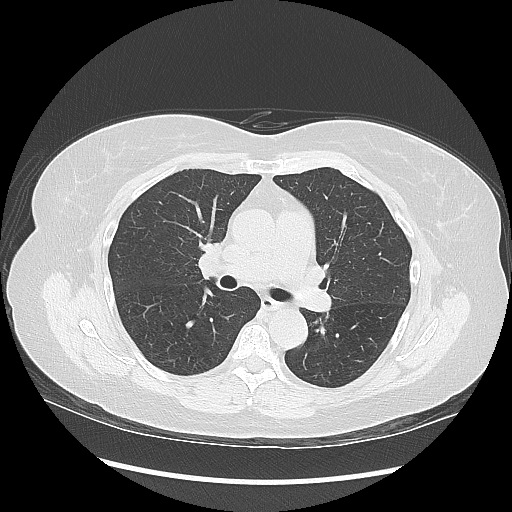
[im 92/123  lung]
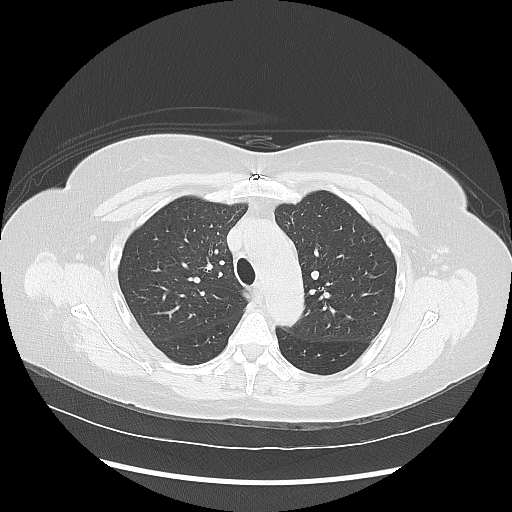
[im 107/123  lung]
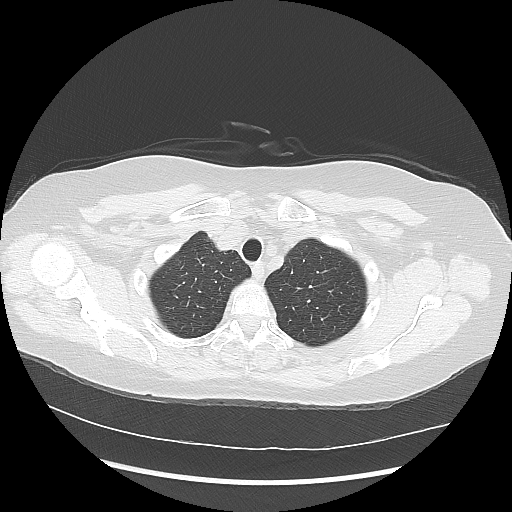

[Series 602: sagittal body · sagittal · 0.76mm/px · 4 of 156 slices shown]
[im 16/156  mediastinal]
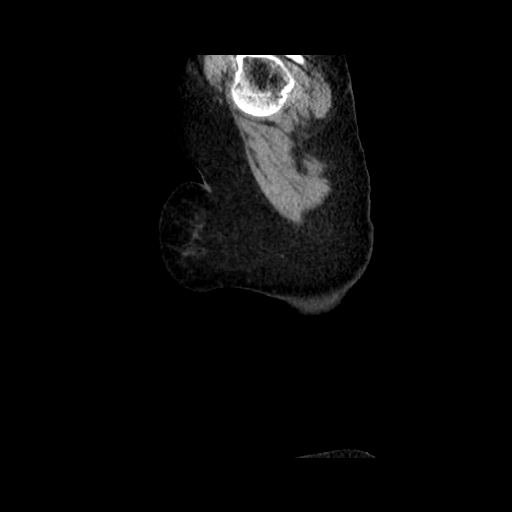
[im 32/156  mediastinal]
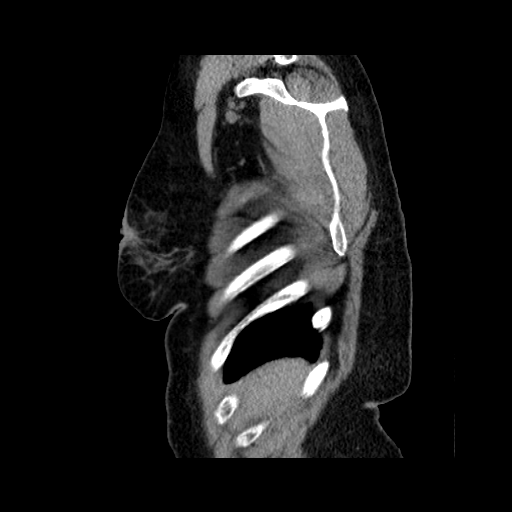
[im 47/156  mediastinal]
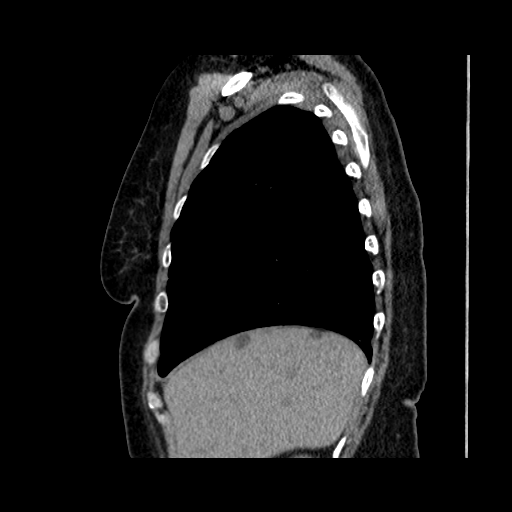
[im 63/156  mediastinal]
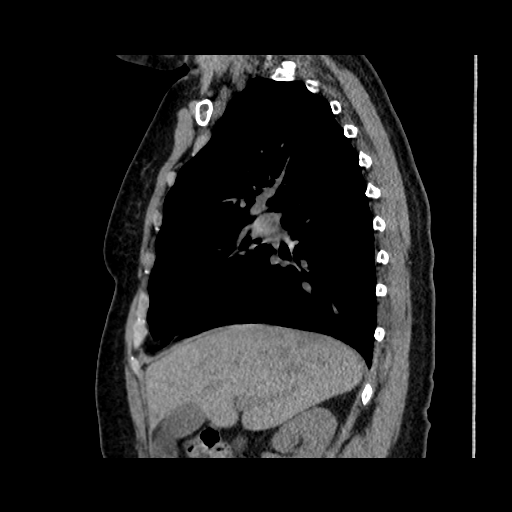

[17 of 30 positions shown; findings below may reference images not displayed]

FINDINGS: Cardiovascular: No significant vascular findings. Normal heart size.
No pericardial effusion.

Mediastinum/Nodes: No enlarged mediastinal or axillary lymph nodes.
Thyroid gland, trachea, and esophagus demonstrate no significant
findings.

Lungs/Pleura: 3 mm nodule is noted in right upper lobe best seen on
image number 24 series 4. No acute pulmonary disease is noted. No
pleural effusion or pneumothorax.

Upper Abdomen: Hepatic cysts are noted. No other abnormality seen in
the upper abdomen.

Musculoskeletal: No chest wall mass or suspicious bone lesions
identified.
IMPRESSION: 3 mm nodule seen in right upper lobe. No follow-up needed if patient
is low-risk. Non-contrast chest CT can be considered in 12 months if
patient is high-risk. This recommendation follows the consensus
statement: Guidelines for Management of Incidental Pulmonary Nodules
Detected on CT Images: From the [HOSPITAL] 0280; Radiology
0280; [DATE].

No other definite abnormality seen in the chest.

## 2018-06-09 NOTE — Telephone Encounter (Signed)
Spoke with patient. Let her know that she can either have the bus drop her off and she can walk up to where we are checking INR, or she can ask the [employer] bus driver if he/she would be willing to do through the drive thru and wait for her as it only takes a few min.

## 2018-06-09 NOTE — Telephone Encounter (Signed)
New Message    Pt comes on the bus and is wondering how to do the drive up appt   Please call

## 2018-06-13 ENCOUNTER — Telehealth: Payer: Self-pay

## 2018-06-13 NOTE — Telephone Encounter (Signed)

## 2018-06-14 IMAGING — DX DG CHEST 2V
2 series · 2 of 2 positions shown · non-contrast
Comparison: June 28, 2016

CLINICAL DATA: Left-sided chest pain.  Shortness of breath.

EXAM:
CHEST  2 VIEW

[w chest pa]
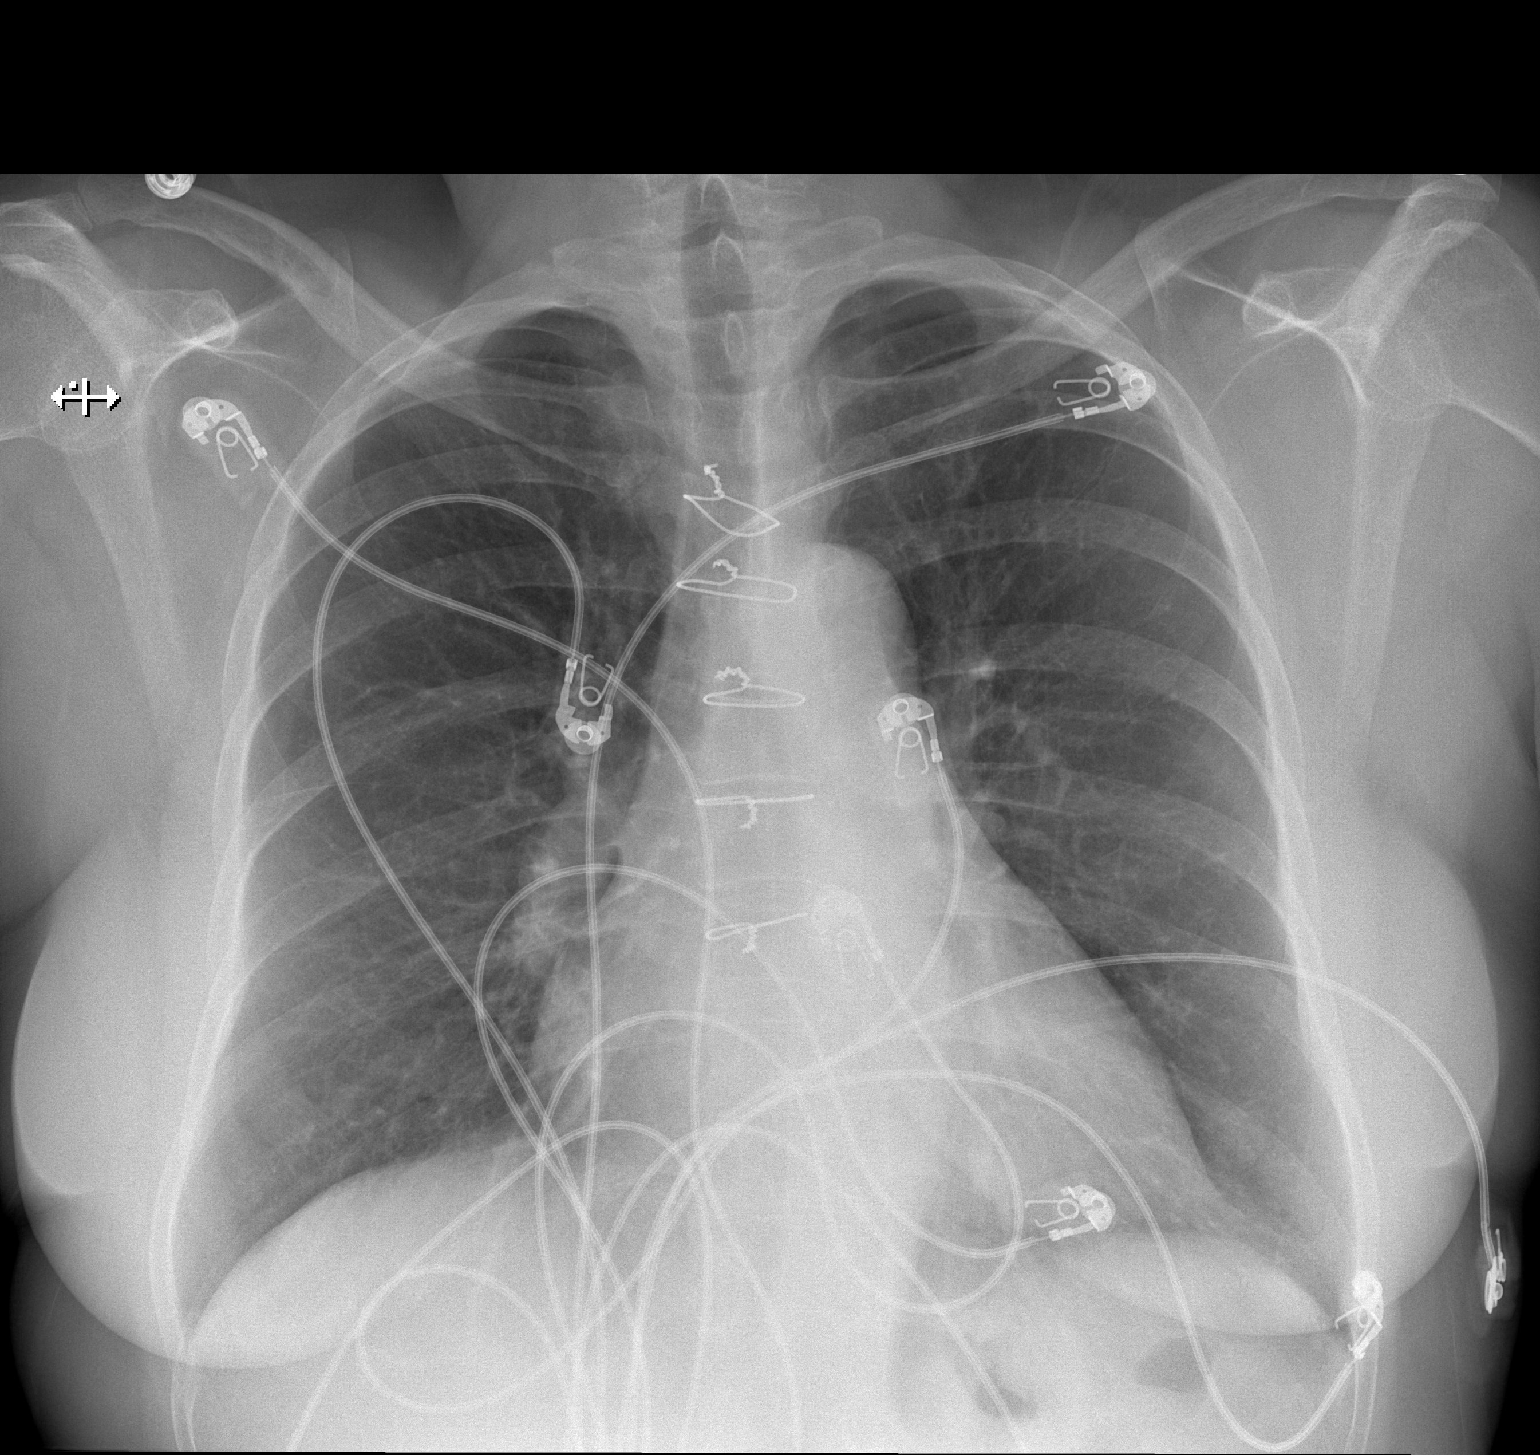

[w chest lat]
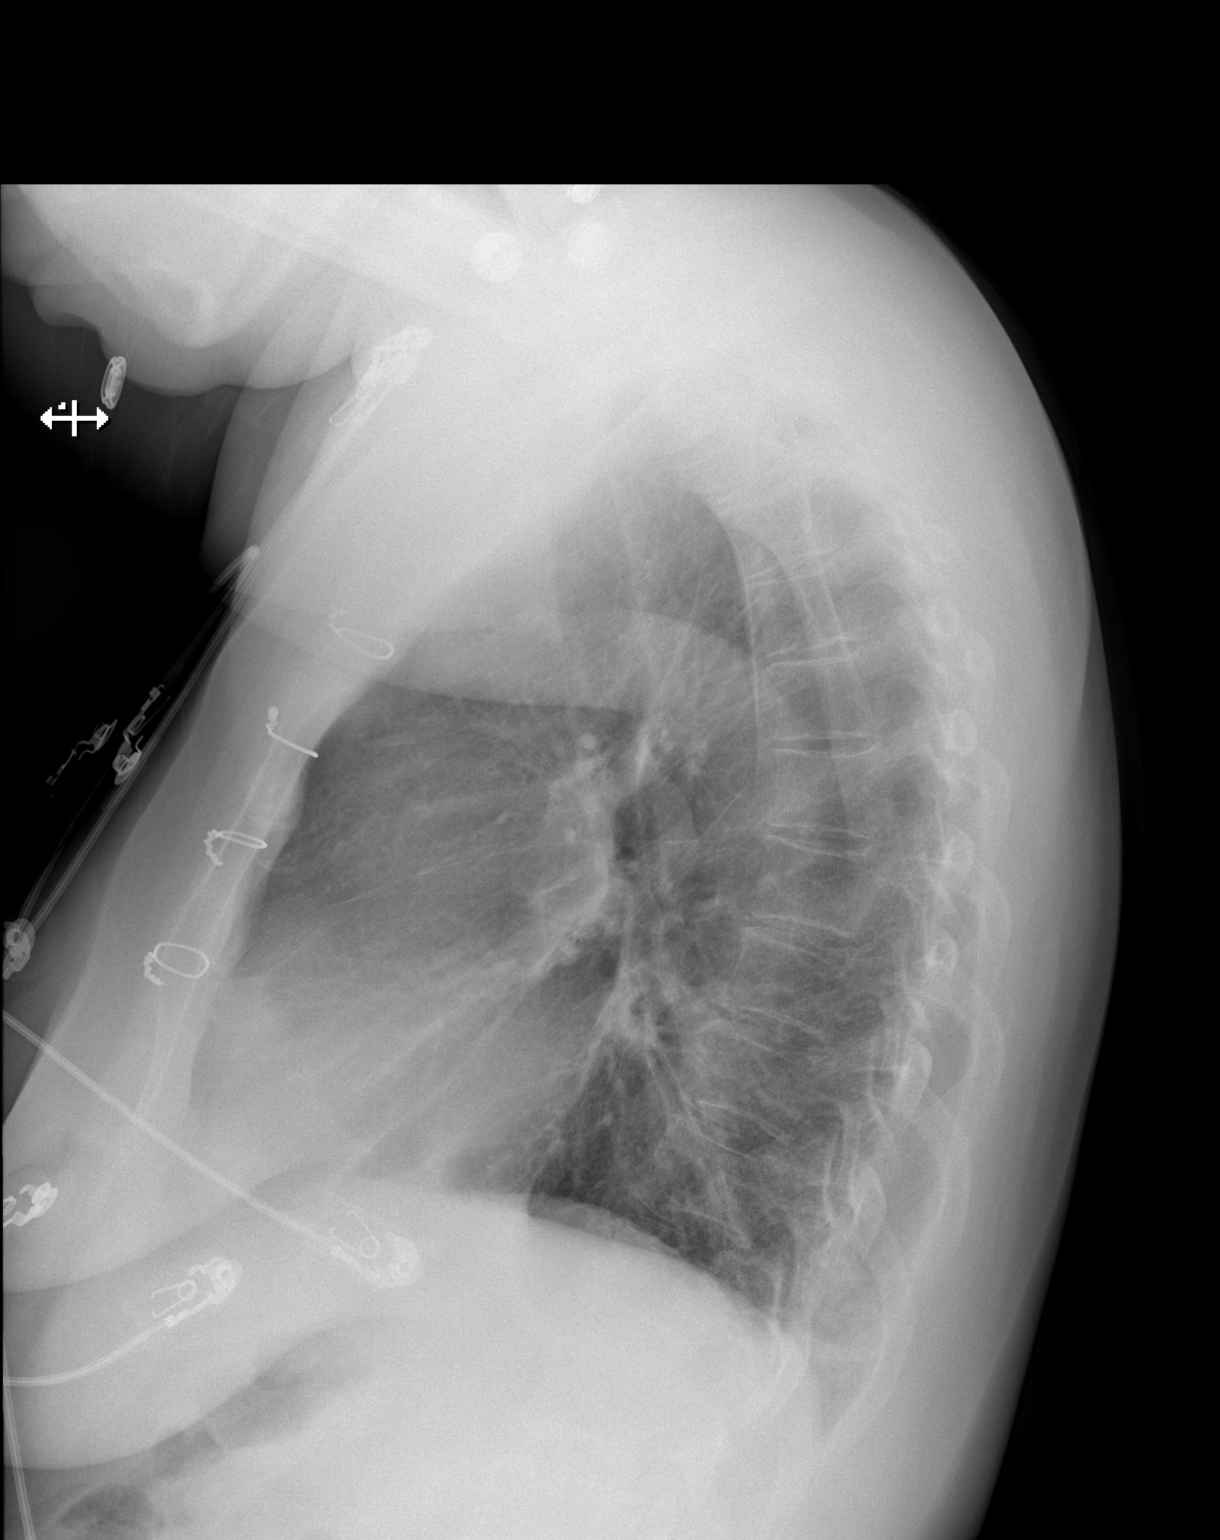

[2 of 2 positions shown; findings below may reference images not displayed]

FINDINGS: Stable cardiomegaly. The hila and mediastinum are normal. No
pneumothorax. No nodules or masses. No focal infiltrates.
IMPRESSION: No active cardiopulmonary disease.

## 2018-07-05 ENCOUNTER — Telehealth: Payer: Self-pay | Admitting: Cardiology

## 2018-07-05 NOTE — Telephone Encounter (Signed)
Amy Rivers was calling to confirm appt for tomorrow.  He will call back.

## 2018-07-05 NOTE — Telephone Encounter (Signed)
New Message ° ° ° °Pt is returning call  ° ° ° °Please call back  °

## 2018-07-06 ENCOUNTER — Telehealth (INDEPENDENT_AMBULATORY_CARE_PROVIDER_SITE_OTHER): Payer: 59 | Admitting: Cardiology

## 2018-07-06 ENCOUNTER — Encounter: Payer: Self-pay | Admitting: Cardiology

## 2018-07-06 ENCOUNTER — Other Ambulatory Visit: Payer: Self-pay

## 2018-07-06 VITALS — BP 135/93 | Ht 64.0 in | Wt 213.0 lb

## 2018-07-06 DIAGNOSIS — Z720 Tobacco use: Secondary | ICD-10-CM

## 2018-07-06 DIAGNOSIS — I10 Essential (primary) hypertension: Secondary | ICD-10-CM

## 2018-07-06 DIAGNOSIS — I2510 Atherosclerotic heart disease of native coronary artery without angina pectoris: Secondary | ICD-10-CM

## 2018-07-06 DIAGNOSIS — Z86711 Personal history of pulmonary embolism: Secondary | ICD-10-CM

## 2018-07-06 DIAGNOSIS — I209 Angina pectoris, unspecified: Secondary | ICD-10-CM

## 2018-07-06 DIAGNOSIS — Z7901 Long term (current) use of anticoagulants: Secondary | ICD-10-CM

## 2018-07-06 NOTE — Patient Instructions (Signed)
Medication Instructions:  1) DISCONTINUE Isosorbide  If you need a refill on your cardiac medications before your next appointment, please call your pharmacy.   Lab work: None If you have labs (blood work) drawn today and your tests are completely normal, you will receive your results only by: Marland Kitchen MyChart Message (if you have MyChart) OR . A paper copy in the mail If you have any lab test that is abnormal or we need to change your treatment, we will call you to review the results.  Testing/Procedures: None  Follow-Up: Your physician wants you to follow-up in: 6 months with Nada Boozer, NP. You will receive a reminder letter in the mail two months in advance. If you don't receive a letter, please call our office to schedule the follow-up appointment.  At [facility], you and your health needs are our priority.  As part of our continuing mission to provide you with exceptional heart care, we have created designated Provider Care Teams.  These Care Teams include your primary Cardiologist (physician) and Advanced Practice Providers (APPs -  Physician Assistants and Nurse Practitioners) who all work together to provide you with the care you need, when you need it. You will need a follow up appointment in 12 months.  Please call our office 2 months in advance to schedule this appointment.  You may see Donato Schultz, MD or one of the following Advanced Practice Providers on your designated Care Team:   Norma Fredrickson, NP Nada Boozer, NP . Georgie Chard, NP  Any Other Special Instructions Will Be Listed Below (If Applicable).

## 2018-07-06 NOTE — Progress Notes (Signed)
Virtual Visit via Telephone Note   This visit type was conducted due to national recommendations for restrictions regarding the COVID-19 Pandemic (e.g. social distancing) in an effort to limit this patient's exposure and mitigate transmission in our community.  Due to her co-morbid illnesses, this patient is at least at moderate risk for complications without adequate follow up.  This format is felt to be most appropriate for this patient at this time.  The patient did not have access to video technology/had technical difficulties with video requiring transitioning to audio format only (telephone).  All issues noted in this document were discussed and addressed.  No physical exam could be performed with this format.  Please refer to the patient's chart for her  consent to telehealth for [facility].   Date:  07/06/2018   ID:  Amy Rivers, DOB 09-21-1965, MRN [id number]  Patient Location: Home Provider Location: Home  PCP:  Jackie Plum, MD  Cardiologist:  Donato Schultz, MD  Electrophysiologist:  None   Evaluation Performed:  Follow-Up Visit  Chief Complaint:  Angina  History of Present Illness:    Amy Rivers is a 53 y.o. female with coronary artery disease CABG x1 recurrent pulmonary embolism on Coumadin here for follow-up.  Continues to smoke.  Was having some chest discomfort previously took nitroglycerin 2 times a week.  Her nuclear stress test on 06/16/2017 was low risk with no ischemia.  We gave her isosorbide 30 mg a day.  Unfortunately, she was unable to take this because of headache.  She is now off.  She is comfortable taking nitroglycerin as needed.  The patient does not have symptoms concerning for COVID-19 infection (fever, chills, cough, or new shortness of breath).    Past Medical History:  Diagnosis Date  . Allergic rhinitis   . Anticoagulant long-term use   . Bipolar 1 disorder (HCC)   . CAD (coronary artery disease), native coronary artery    ANGINA  PRESENCE UNSPECIFIED  . CHF (congestive heart failure) (HCC)   . COPD (chronic obstructive pulmonary disease) (HCC)   . DDD (degenerative disc disease), lumbar   . Fibromyalgia   . Fibromyalgia   . GERD (gastroesophageal reflux disease)   . H/O degenerative disc disease   . Hx of tobacco use, presenting hazards to health   . Hyperlipidemia   . Hypertension   . Low back pain   . Obesity (BMI 30.0-34.9)   . Other chronic pain   . Pulmonary embolism (HCC)   . Scoliosis    Past Surgical History:  Procedure Laterality Date  . ABDOMINAL HYSTERECTOMY    . ABDOMINAL SURGERY    . CARDIAC SURGERY       Current Meds  Medication Sig  . atorvastatin (LIPITOR) 80 MG tablet Take 80 mg by mouth at bedtime.  Marland Kitchen buPROPion (WELLBUTRIN SR) 150 MG 12 hr tablet Take 150 mg by mouth 2 (two) times daily.  Marland Kitchen gabapentin (NEURONTIN) 300 MG capsule Take 300 mg by mouth 2 (two) times daily.  . isosorbide mononitrate (IMDUR) 30 MG 24 hr tablet Take 1 tablet (30 mg total) by mouth daily.  . metoprolol tartrate (LOPRESSOR) 25 MG tablet TAKE 2 TABLETS(50 MG) BY MOUTH TWICE DAILY  . nitroGLYCERIN (NITROSTAT) 0.4 MG SL tablet Place 1 tablet (0.4 mg total) under the tongue every 5 (five) minutes x 3 doses as needed for chest pain.  Marland Kitchen OLANZapine (ZYPREXA) 20 MG tablet Take 20 mg by mouth at bedtime.  . pantoprazole (PROTONIX) 40 MG  tablet Take 40 mg by mouth daily before breakfast.  . tiZANidine (ZANAFLEX) 4 MG tablet TAKE 1 TABLET BY MOUTH THREE TIMES A DAY IF NEEDED  . topiramate (TOPAMAX) 25 MG tablet Take 25 mg by mouth at bedtime.  . VENTOLIN HFA 108 (90 Base) MCG/ACT inhaler Inhale 2 puffs into the lungs 2 (two) times daily.   Marland Kitchen. warfarin (COUMADIN) 5 MG tablet Take 1/2 or 1 tablet daily as directed by Coumadin clinic     Allergies:   Keflet [cephalexin]; Morphine and related; Sulfa antibiotics; Iodine; Strawberry extract; and Cymbalta [duloxetine hcl]   Social History   Tobacco Use  . Smoking status:  Current Every Day Smoker  . Smokeless tobacco: Never Used  Substance Use Topics  . Alcohol use: No  . Drug use: No     Family Hx: The patient's family history includes Arthritis in her father; Hepatitis in her mother.  ROS:   Please see the history of present illness.    No significant orthopnea PND bleeding syncope All other systems reviewed and are negative.   Prior CV studies:   The following studies were reviewed today:  Nuclear stress test as above  Labs/Other Tests and Data Reviewed:    EKG:  06/03/2017 was personally reviewed today and demonstrated:  Sinus rhythm nonspecific T wave changes  Recent Labs: No results found for requested labs within last 8760 hours.   Recent Lipid Panel Lab Results  Component Value Date/Time   CHOL 139 06/03/2017 02:56 PM   TRIG 88 06/03/2017 02:56 PM   HDL 47 06/03/2017 02:56 PM   CHOLHDL 3.0 06/03/2017 02:56 PM   LDLCALC 74 06/03/2017 02:56 PM    Wt Readings from Last 3 Encounters:  07/06/18 213 lb (96.6 kg)  06/07/18 190 lb (86.2 kg)  10/07/17 214 lb 6.4 oz (97.3 kg)     Objective:    Vital Signs:  BP (!) 135/93   Ht 5\' 4"  (1.626 m)   Wt 213 lb (96.6 kg)   LMP  (LMP Unknown)   BMI 36.56 kg/m    VITAL SIGNS:  reviewed  ASSESSMENT & PLAN:    Coronary artery disease with angina - Continue with aggressive secondary prevention, post CABG x1. -Nuclear stress test 06/16/2017 low risk no ischemia -Was feeling some anginal symptoms and taking nitroglycerin.  We went ahead and tried isosorbide 30 mg once a day. She stopped it because of HA and worried about clots.  I told her I am fine with her stopping the isosorbide and we will continue with the as needed nitroglycerin and will continue with the metoprolol as an antianginal medication.  History of recurrent pulmonary embolism - Coumadin.  No changes.  She has a drive-through follow-up visit  Hyperlipidemia - Continue with atorvastatin.  Ideally LDL less than 70, stable   Abnormal EKG -Prior T wave inversions noted in the anterior precordial leads, no changes  Essential hypertension - Medications reviewed, continue to monitor. No change  Tobacco use - At last visit we gave her Zyban prescription.  Hard to quit. But trying to quit again.   COVID-19 Education: The signs and symptoms of COVID-19 were discussed with the patient and how to seek care for testing (follow up with PCP or arrange E-visit).  The importance of social distancing was discussed today.  Time:   Today, I have spent 11 minutes with the patient with telehealth technology discussing the above problems.     Medication Adjustments/Labs and Tests Ordered: Current medicines are  reviewed at length with the patient today.  Concerns regarding medicines are outlined above.   Tests Ordered: No orders of the defined types were placed in this encounter.   Medication Changes: No orders of the defined types were placed in this encounter.   Disposition:  Follow up in 6 month(s)  Signed, Donato Schultz, MD  07/06/2018 3:56 PM    [facility]

## 2018-07-13 ENCOUNTER — Telehealth: Payer: Self-pay

## 2018-07-13 NOTE — Telephone Encounter (Signed)

## 2018-07-15 ENCOUNTER — Telehealth: Payer: Self-pay | Admitting: Pharmacist

## 2018-07-15 NOTE — Telephone Encounter (Signed)
Patient called to make aware of upcoming appointment time and that it will be held in clinic. LVM will call back number.

## 2018-07-19 ENCOUNTER — Ambulatory Visit (INDEPENDENT_AMBULATORY_CARE_PROVIDER_SITE_OTHER): Payer: 59 | Admitting: *Deleted

## 2018-07-19 ENCOUNTER — Other Ambulatory Visit: Payer: Self-pay

## 2018-07-19 DIAGNOSIS — I2699 Other pulmonary embolism without acute cor pulmonale: Secondary | ICD-10-CM | POA: Diagnosis not present

## 2018-07-19 DIAGNOSIS — Z5181 Encounter for therapeutic drug level monitoring: Secondary | ICD-10-CM | POA: Diagnosis not present

## 2018-07-19 LAB — POCT INR: INR: 1.4 — AB (ref 2.0–3.0)

## 2018-07-19 NOTE — Patient Instructions (Addendum)
Description    Take a whole tablet today and tomorrow and then continue 1/2 tablet daily except 1 tablet on Sundays and Thursdays. Recheck INR in 1week.  Call us any concerns at  Main [phone] [facility] [phone].

## 2018-07-20 ENCOUNTER — Telehealth: Payer: Self-pay

## 2018-07-20 NOTE — Telephone Encounter (Signed)

## 2018-07-25 ENCOUNTER — Other Ambulatory Visit: Payer: Self-pay

## 2018-07-25 ENCOUNTER — Ambulatory Visit (INDEPENDENT_AMBULATORY_CARE_PROVIDER_SITE_OTHER): Payer: 59 | Admitting: *Deleted

## 2018-07-25 DIAGNOSIS — I2699 Other pulmonary embolism without acute cor pulmonale: Secondary | ICD-10-CM

## 2018-07-25 DIAGNOSIS — Z5181 Encounter for therapeutic drug level monitoring: Secondary | ICD-10-CM

## 2018-07-25 LAB — POCT INR: INR: 2.3 (ref 2.0–3.0)

## 2018-07-25 NOTE — Patient Instructions (Addendum)
Description   Continue taking 1/2 tablet daily except 1 tablet on Sundays and Thursdays. Recheck INR in 9 days.  Call us any concerns at  [facility] [phone].

## 2018-07-28 ENCOUNTER — Telehealth: Payer: Self-pay

## 2018-07-28 NOTE — Telephone Encounter (Signed)
Unable to lmom for prescreen because their mailbox is full

## 2018-08-03 ENCOUNTER — Ambulatory Visit (INDEPENDENT_AMBULATORY_CARE_PROVIDER_SITE_OTHER): Payer: 59 | Admitting: *Deleted

## 2018-08-03 ENCOUNTER — Other Ambulatory Visit: Payer: Self-pay | Admitting: Cardiology

## 2018-08-03 ENCOUNTER — Other Ambulatory Visit: Payer: Self-pay

## 2018-08-03 DIAGNOSIS — Z5181 Encounter for therapeutic drug level monitoring: Secondary | ICD-10-CM

## 2018-08-03 DIAGNOSIS — I2699 Other pulmonary embolism without acute cor pulmonale: Secondary | ICD-10-CM

## 2018-08-03 LAB — POCT INR: INR: 2.3 (ref 2.0–3.0)

## 2018-08-03 MED ORDER — WARFARIN SODIUM 5 MG PO TABS: ORAL_TABLET | ORAL | 0 refills | Status: DC

## 2018-08-03 NOTE — Patient Instructions (Addendum)
Description   Continue taking 1/2 tablet daily except 1 tablet on Sundays and Thursdays. Recheck INR on 08/15/2018. Call us with any concerns,  Main [phone] [facility] [phone].

## 2018-08-08 ENCOUNTER — Telehealth: Payer: Self-pay

## 2018-08-08 NOTE — Telephone Encounter (Signed)

## 2018-09-24 ENCOUNTER — Other Ambulatory Visit: Payer: Self-pay | Admitting: Cardiology

## 2018-09-26 NOTE — Telephone Encounter (Signed)
Called and scheduled an appt for 8/14 hold refill until seen

## 2018-09-30 ENCOUNTER — Other Ambulatory Visit: Payer: Self-pay | Admitting: Cardiology

## 2018-09-30 ENCOUNTER — Other Ambulatory Visit: Payer: Self-pay

## 2018-09-30 ENCOUNTER — Ambulatory Visit (INDEPENDENT_AMBULATORY_CARE_PROVIDER_SITE_OTHER): Payer: 59 | Admitting: Pharmacist

## 2018-09-30 DIAGNOSIS — I2699 Other pulmonary embolism without acute cor pulmonale: Secondary | ICD-10-CM | POA: Diagnosis not present

## 2018-09-30 DIAGNOSIS — Z5181 Encounter for therapeutic drug level monitoring: Secondary | ICD-10-CM

## 2018-09-30 LAB — POCT INR: INR: 2.2 (ref 2.0–3.0)

## 2018-09-30 NOTE — Patient Instructions (Signed)
Continue taking 1/2 tablet daily except 1 tablet on Sundays and Thursdays. Recheck INR in 6 weeks . Call us with any concerns,  Main [phone] [facility] [phone].

## 2018-10-11 DIAGNOSIS — D689 Coagulation defect, unspecified: Secondary | ICD-10-CM | POA: Diagnosis not present

## 2018-10-11 DIAGNOSIS — Z1159 Encounter for screening for other viral diseases: Secondary | ICD-10-CM | POA: Diagnosis not present

## 2018-10-11 DIAGNOSIS — Z79899 Other long term (current) drug therapy: Secondary | ICD-10-CM | POA: Diagnosis not present

## 2018-11-11 ENCOUNTER — Encounter (INDEPENDENT_AMBULATORY_CARE_PROVIDER_SITE_OTHER): Payer: Self-pay

## 2018-11-11 ENCOUNTER — Ambulatory Visit (INDEPENDENT_AMBULATORY_CARE_PROVIDER_SITE_OTHER): Payer: Medicare Other | Admitting: *Deleted

## 2018-11-11 ENCOUNTER — Other Ambulatory Visit: Payer: Self-pay

## 2018-11-11 DIAGNOSIS — Z5181 Encounter for therapeutic drug level monitoring: Secondary | ICD-10-CM

## 2018-11-11 DIAGNOSIS — I2699 Other pulmonary embolism without acute cor pulmonale: Secondary | ICD-10-CM

## 2018-11-11 LAB — POCT INR: INR: 1.9 — AB (ref 2.0–3.0)

## 2018-11-11 NOTE — Patient Instructions (Addendum)
Description   Today take 1 tablet then continue taking 1/2 tablet daily except 1 tablet on Sundays and Thursdays. Recheck INR in 4 weeks. Call us with any concerns,  Main [phone] [facility] [phone].

## 2018-11-14 ENCOUNTER — Other Ambulatory Visit: Payer: Self-pay | Admitting: Cardiology

## 2018-11-22 DIAGNOSIS — Z86711 Personal history of pulmonary embolism: Secondary | ICD-10-CM | POA: Diagnosis not present

## 2018-11-22 DIAGNOSIS — R1310 Dysphagia, unspecified: Secondary | ICD-10-CM | POA: Diagnosis not present

## 2018-11-22 DIAGNOSIS — R198 Other specified symptoms and signs involving the digestive system and abdomen: Secondary | ICD-10-CM | POA: Diagnosis not present

## 2018-11-22 DIAGNOSIS — K219 Gastro-esophageal reflux disease without esophagitis: Secondary | ICD-10-CM | POA: Diagnosis not present

## 2018-11-22 DIAGNOSIS — R1033 Periumbilical pain: Secondary | ICD-10-CM | POA: Diagnosis not present

## 2018-11-22 DIAGNOSIS — Z8371 Family history of colonic polyps: Secondary | ICD-10-CM | POA: Diagnosis not present

## 2018-11-22 DIAGNOSIS — K625 Hemorrhage of anus and rectum: Secondary | ICD-10-CM | POA: Diagnosis not present

## 2018-12-02 ENCOUNTER — Telehealth: Payer: Self-pay | Admitting: *Deleted

## 2018-12-02 NOTE — Telephone Encounter (Signed)
Pt takes warfarin for recurrent PE. Pt will require Lovenox bridging in order to hold her warfarin for 5 days prior to procedure. Will coordinate at upcoming INR check.

## 2018-12-02 NOTE — Telephone Encounter (Signed)
   [facility] Pre-operative Risk Assessment    Request for surgical clearance:  1. What type of surgery is being performed? COLONOSCOPY/ENDOSCOPY   2. When is this surgery scheduled? TBD; SOMETIME IN NOVEMBER   3. What type of clearance is required (medical clearance vs. Pharmacy clearance to hold med vs. Both)? BOTH  4. Are there any medications that need to be held prior to surgery and how long? COUMADIN X 5 DAYS PRIOR   5. Practice name and name of physician performing surgery? [facility]; DR. Alessandra Bevels   6. What is your office phone number [phone]    7.   What is your office fax number [phone]  8.   Anesthesia type (None, local, MAC, general) ? PROPOFOL   Julaine Hua 12/02/2018, 12:10 PM  _________________________________________________________________   (provider comments below)

## 2018-12-05 ENCOUNTER — Other Ambulatory Visit: Payer: Self-pay | Admitting: Cardiology

## 2018-12-05 MED ORDER — WARFARIN SODIUM 5 MG PO TABS: ORAL_TABLET | ORAL | 0 refills | Status: DC

## 2018-12-05 MED ORDER — WARFARIN SODIUM 5 MG PO TABS: ORAL_TABLET | ORAL | 0 refills | Status: AC

## 2018-12-05 NOTE — Telephone Encounter (Signed)
°*  STAT* If patient is at the pharmacy, call can be transferred to refill team.   1. Which medications need to be refilled? (please list name of each medication and dose if known)  warfarin (COUMADIN) 5 MG tablet  2. Which pharmacy/location (including street and city if local pharmacy) is medication to be sent to? [place], [place]  3. Do they need a 30 day or 90 day supply? 90  Patient will be out of medicine by Friday

## 2018-12-05 NOTE — Telephone Encounter (Signed)
Call placed to pt re: surgical clearance / symptoms pt is having, per phone note below.  I called to schedule an appt and left a detailed message on pt's voicemail to call the office to schedule

## 2018-12-05 NOTE — Telephone Encounter (Signed)
Pt is scheduled to see Dr. Marlou Porch 12/30/2018.  Will fax it back to the requesting surgeon's office to make them aware.

## 2018-12-05 NOTE — Telephone Encounter (Signed)
    Primary Cardiologist:Mark Marlou Porch, MD  Spoke with patient. She is having cardiac issue. Says "heart is fluttering and took SL nitro".   She needs office visit to evaluate her symptoms along with coumadin clinic visit to discuss Lovenox bridging.  Pre-op covering staff: - Please schedule appointment and call patient to inform them. - Please contact requesting surgeon's office via preferred method (i.e, phone, fax) to inform them of need for appointment prior to surgery.    [place], [place]  12/05/2018, 1:59 PM

## 2018-12-06 ENCOUNTER — Telehealth: Payer: Self-pay | Admitting: Cardiology

## 2018-12-06 NOTE — Telephone Encounter (Signed)
Called and spoke to [employer] made them aware that it is okay that they are using a different manufacturer of warfarin.

## 2018-12-06 NOTE — Telephone Encounter (Addendum)
Made pt aware that her pharmacy was changing manufacturs of warfarin. Instructed her to continue taking coumadin as instructed by the coumadin clinic and that we could continue to follow her INR . Confirmed pt's appointment for 10/23.

## 2018-12-06 NOTE — Telephone Encounter (Signed)
Pharmacy is requesting ok from Dr. Marlou Porch to change the manufacturer for warfarin (COUMADIN) 5 MG tablet because this is a new pharmacy for the patient for this medication.

## 2018-12-09 ENCOUNTER — Other Ambulatory Visit: Payer: Self-pay

## 2018-12-09 ENCOUNTER — Ambulatory Visit (INDEPENDENT_AMBULATORY_CARE_PROVIDER_SITE_OTHER): Payer: Medicare Other | Admitting: *Deleted

## 2018-12-09 DIAGNOSIS — I2699 Other pulmonary embolism without acute cor pulmonale: Secondary | ICD-10-CM

## 2018-12-09 DIAGNOSIS — Z5181 Encounter for therapeutic drug level monitoring: Secondary | ICD-10-CM

## 2018-12-09 LAB — POCT INR: INR: 3.7 — AB (ref 2.0–3.0)

## 2018-12-09 NOTE — Patient Instructions (Signed)
Description   Hold today, then continue taking 1/2 tablet daily except 1 tablet on Sundays and Thursdays. Recheck INR in 2  weeks. Call us with any concerns,  Main [phone] [facility] [phone].

## 2018-12-09 NOTE — Progress Notes (Signed)
Spoke with pt who stated that she would does not want to switch to Eliquis at this time.

## 2018-12-19 ENCOUNTER — Ambulatory Visit: Payer: Medicare Other | Admitting: Cardiology

## 2018-12-30 ENCOUNTER — Ambulatory Visit (INDEPENDENT_AMBULATORY_CARE_PROVIDER_SITE_OTHER): Payer: Medicare Other | Admitting: *Deleted

## 2018-12-30 ENCOUNTER — Other Ambulatory Visit: Payer: Self-pay | Admitting: Family Medicine

## 2018-12-30 ENCOUNTER — Encounter: Payer: Self-pay | Admitting: Cardiology

## 2018-12-30 ENCOUNTER — Ambulatory Visit (INDEPENDENT_AMBULATORY_CARE_PROVIDER_SITE_OTHER): Payer: Medicare Other | Admitting: Cardiology

## 2018-12-30 ENCOUNTER — Other Ambulatory Visit: Payer: Self-pay

## 2018-12-30 VITALS — BP 124/84 | HR 64 | Ht 64.0 in | Wt 206.0 lb

## 2018-12-30 DIAGNOSIS — Z5181 Encounter for therapeutic drug level monitoring: Secondary | ICD-10-CM | POA: Diagnosis not present

## 2018-12-30 DIAGNOSIS — I209 Angina pectoris, unspecified: Secondary | ICD-10-CM

## 2018-12-30 DIAGNOSIS — I2699 Other pulmonary embolism without acute cor pulmonale: Secondary | ICD-10-CM

## 2018-12-30 DIAGNOSIS — Z720 Tobacco use: Secondary | ICD-10-CM | POA: Diagnosis not present

## 2018-12-30 DIAGNOSIS — Z1231 Encounter for screening mammogram for malignant neoplasm of breast: Secondary | ICD-10-CM

## 2018-12-30 DIAGNOSIS — I10 Essential (primary) hypertension: Secondary | ICD-10-CM | POA: Diagnosis not present

## 2018-12-30 DIAGNOSIS — Z86711 Personal history of pulmonary embolism: Secondary | ICD-10-CM

## 2018-12-30 DIAGNOSIS — I2510 Atherosclerotic heart disease of native coronary artery without angina pectoris: Secondary | ICD-10-CM | POA: Diagnosis not present

## 2018-12-30 LAB — POCT INR: INR: 2.2 (ref 2.0–3.0)

## 2018-12-30 MED ORDER — METOPROLOL TARTRATE 50 MG PO TABS: 50.0000 mg | ORAL_TABLET | Freq: Two times a day (BID) | ORAL | 3 refills | Status: AC

## 2018-12-30 MED ORDER — ISOSORBIDE MONONITRATE ER 30 MG PO TB24: 30.0000 mg | ORAL_TABLET | Freq: Every day | ORAL | 3 refills | Status: DC

## 2018-12-30 NOTE — Progress Notes (Signed)
Pt will call us when she has colonoscopy scheduled so that she can be bridge with Lovenox

## 2018-12-30 NOTE — Patient Instructions (Signed)
Medication Instructions:  Please increase your Metoprolol tartrate to 50 mg twice a day. Start Isosorbide 30 mg once a day.  You may try taking this at night if it gives you a headache. Continue all other medications as listed.  *If you need a refill on your cardiac medications before your next appointment, please call your pharmacy*  Follow-Up: At [facility], you and your health needs are our priority.  As part of our continuing mission to provide you with exceptional heart care, we have created designated Provider Care Teams.  These Care Teams include your primary Cardiologist (physician) and Advanced Practice Providers (APPs -  Physician Assistants and Nurse Practitioners) who all work together to provide you with the care you need, when you need it.  Your next appointment:   2 months  The format for your next appointment:   In Person  Provider:   You may see Candee Furbish, MD or one of the following Advanced Practice Providers on your designated Care Team:    Truitt Merle, NP  Cecilie Kicks, NP  Kathyrn Drown, NP  Thank you for choosing [facility]!!

## 2018-12-30 NOTE — Progress Notes (Signed)
Cardiology Office Note:    Date:  12/30/2018   ID:  Amy Rivers, DOB [birth year], MRN [id number]  PCP:  Amy Plum, MD  Cardiologist:  Amy Schultz, MD    Referring MD: Amy Plum, MD     History of Present Illness:    Amy Rivers is a 53 y.o. 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 In review of prior office notes, she had an episode of chest discomfort that was concerning to her like a blood clot was happening again ". CT scan of chest did not show any evidence of thrombosis.  Overall she has been doing fairly well. She has some fatigue with exertion. Denies any recent anginal symptoms. She's been on Coumadin for years she states.  She also states that she has a history of congestive heart failure.   She states that her Breathing is so so. CABG  LDL 132  Tired with ambulation. No CP, no syncope.  From [place].  Tob use-continues to smoke. She was previously diagnosed with COPD but most recent exam by her primary physician was to the contrary.   10/07/2017-since her last visit she did have a nuclear stress test on 06/16/2017 which was low risk, no ischemia.  Normal ejection fraction.  Remains on Coumadin.  Try to one point to switch her to Eliquis but this was unsuccessful. Wants to swim. Wants help to stop smoking.  We will try Wellbutrin.  Currently denies any chest discomfort.  She did have some atypical chest pain previously.  Denies any fevers chills nausea vomiting syncope bleeding.  12/30/2018 -here for coronary artery disease/anginal follow-up.  She still having chest discomfort with exertional activity.  When she walks, sometimes she will feel her heart beating quickly and start to develop some chest discomfort.  She usually has to stop take some nitroglycerin usually 2 tablets to  relieve her discomfort.  At one point she was on isosorbide but developed a headache and stopped it.  We will try this again but take it in the evening hours.  She also is interested in taking her colonoscopy soon.  She may proceed with this.   Past Medical History:  Diagnosis Date  . Allergic rhinitis   . Anticoagulant long-term use   . Bipolar 1 disorder (HCC)   . CAD (coronary artery disease), native coronary artery    ANGINA PRESENCE UNSPECIFIED  . CHF (congestive heart failure) (HCC)   . COPD (chronic obstructive pulmonary disease) (HCC)   . DDD (degenerative disc disease), lumbar   . Fibromyalgia   . Fibromyalgia   . GERD (gastroesophageal reflux disease)   . H/O degenerative disc disease   . Hx of tobacco use, presenting hazards to health   . Hyperlipidemia   . Hypertension   . Low back pain   . Obesity (BMI 30.0-34.9)   . Other chronic pain   . Pulmonary embolism (HCC)   . Scoliosis     Past Surgical History:  Procedure Laterality Date  . ABDOMINAL HYSTERECTOMY    . ABDOMINAL SURGERY    . CARDIAC SURGERY      Current Medications: Current Meds  Medication Sig  . amLODipine (NORVASC) 2.5 MG tablet Take 2.5 mg by mouth daily.  Marland Kitchen atorvastatin (LIPITOR) 80 MG tablet Take 80 mg by mouth at bedtime.  Marland Kitchen buPROPion [facility] SR) 150  MG 12 hr tablet Take 150 mg by mouth 2 (two) times daily.  Marland Kitchen gabapentin (NEURONTIN) 100 MG capsule Take 100 mg by mouth 3 (three) times daily.  . nitroGLYCERIN (NITROSTAT) 0.4 MG SL tablet Place 1 tablet (0.4 mg total) under the tongue every 5 (five) minutes x 3 doses as needed for chest pain.  . pantoprazole (PROTONIX) 40 MG tablet Take 40 mg by mouth daily before breakfast.  . topiramate (TOPAMAX) 25 MG tablet Take 25 mg by mouth at bedtime.  . VENTOLIN HFA 108 (90 Base) MCG/ACT inhaler Inhale 2 puffs into the lungs 2 (two) times daily.   Marland Kitchen warfarin (COUMADIN) 5 MG tablet Take 1/2 tablet daily except 1 tablet on Sunday and Thursday or TAKE  AS DIRECTED BY COUMADIN CLINIC  . [DISCONTINUED] metoprolol tartrate (LOPRESSOR) 25 MG tablet TAKE 2 TABLETS(50 MG) BY MOUTH TWICE DAILY     Allergies:   Keflet [cephalexin], Morphine and related, Sulfa antibiotics, Iodine, Strawberry extract, and Cymbalta [duloxetine hcl]   Social History   Socioeconomic History  . Marital status: Widowed    Spouse name: Not on file  . Number of children: Not on file  . Years of education: Not on file  . Highest education level: Not on file  Occupational History  . Not on file  Social Needs  . Financial resource strain: Not on file  . Food insecurity    Worry: Not on file    Inability: Not on file  . Transportation needs    Medical: Not on file    Non-medical: Not on file  Tobacco Use  . Smoking status: Current Every Day Smoker  . Smokeless tobacco: Never Used  Substance and Sexual Activity  . Alcohol use: No  . Drug use: No  . Sexual activity: Not on file  Lifestyle  . Physical activity    Days per week: Not on file    Minutes per session: Not on file  . Stress: Not on file  Relationships  . Social Herbalist on phone: Not on file    Gets together: Not on file    Attends religious service: Not on file    Active member of club or organization: Not on file    Attends meetings of clubs or organizations: Not on file    Relationship status: Not on file  Other Topics Concern  . Not on file  Social History Narrative  . Not on file     Family History: The patient's family history includes Arthritis in her father; Hepatitis in her mother. ROS:   Please see the history of present illness.    All others are negative  EKGs/Labs/Other Studies Reviewed:    The following studies were reviewed today: Prior office, lab work, EKG reviewed  Nuclear stress test 06/16/2017:  Nuclear stress EF: 57%.  During infusion there was accentuation of the T wave abnormalities noted on baseline EKG  The study is normal.  This is a low  risk study.  The left ventricular ejection fraction is normal (55-65%).  EKG: 12/30/2018 normal sinus rhythm 64 with T wave inversions noted in V2 V3 prior demonstrates sinus rhythm with T-wave inversion in the anterior precordial leads, cannot exclude anterior ischemia. Similar EKG described previously. Personally viewed.  Recent Labs: No results found for requested labs within last 8760 hours.  Recent Lipid Panel    Component Value Date/Time   CHOL 139 06/03/2017 1456   TRIG 88 06/03/2017 1456   HDL 47  06/03/2017 1456   CHOLHDL 3.0 06/03/2017 1456   LDLCALC 74 06/03/2017 1456    Physical Exam:    VS:  BP 124/84   Pulse 64   Ht 5\' 4"  (1.626 m)   Wt 206 lb (93.4 kg)   LMP  (LMP Unknown)   SpO2 97%   BMI 35.36 kg/m     Wt Readings from Last 3 Encounters:  12/30/18 206 lb (93.4 kg)  07/06/18 213 lb (96.6 kg)  06/07/18 190 lb (86.2 kg)    GEN: Well nourished, well developed, in no acute distress  HEENT: normal  Neck: no JVD, carotid bruits, or masses Cardiac: RRR; no murmurs, rubs, or gallops,no edema  Respiratory:  clear to auscultation bilaterally, normal work of breathing GI: soft, nontender, nondistended, + BS MS: no deformity or atrophy  Skin: warm and dry, no rash Neuro:  Alert and Oriented x 3, Strength and sensation are intact Psych: euthymic mood, full affect    ASSESSMENT:    1. Angina pectoris (HCC)   2. Essential hypertension   3. Tobacco abuse   4. Coronary artery disease involving native coronary artery of native heart without angina pectoris   5. History of pulmonary embolism    PLAN:    In order of problems listed above:  Preop colonoscopy/stomach pain -I am fine with her proceeding with colonoscopy.  She may hold her warfarin for 5 days prior with Lovenox bridge, we will let our pharmacy team know.  Resume afterwards.  Coronary artery disease status post CABG 1 -Atorvastatin, metoprolol. -Stress test in May 2019, low risk.    Occasional  angina -She is taking nitroglycerin for relief.  We will start back isosorbide 30 mg once a day.  If she must, she can take it in the evening time to help reduce headache. -I will also increase her metoprolol tartrate to 50 mg twice a day.  History of recurrent pulmonary embolism - Previously had tried to switch her over to Eliquis but she is back on Coumadin.  Lovenox bridge for colonoscopy. -Creatinine 1.6, hemoglobin 12.5  Hyperlipidemia -LDL 90, continue with atorvastatin.  Abnormal EKG -T-wave inversions noted in the anterior precordial leads chronic.  Essential hypertension -Continue to monitor.  No changes  Tobacco use -  Continue to encourage tobacco cessation  We will have her come back in 2 months for follow-up, check heart rate with increasing metoprolol and anginal report.  Medication Adjustments/Labs and Tests Ordered: Current medicines are reviewed at length with the patient today.  Concerns regarding medicines are outlined above.  Orders Placed This Encounter  Procedures  . EKG 12-Lead   Meds ordered this encounter  Medications  . metoprolol tartrate (LOPRESSOR) 50 MG tablet    Sig: Take 1 tablet (50 mg total) by mouth 2 (two) times daily.    Dispense:  180 tablet    Refill:  3  . isosorbide mononitrate (IMDUR) 30 MG 24 hr tablet    Sig: Take 1 tablet (30 mg total) by mouth daily.    Dispense:  90 tablet    Refill:  3    Signed, Amy SchultzMark Skains, MD  12/30/2018 4:33 PM    [facility]

## 2018-12-30 NOTE — Patient Instructions (Addendum)
Description   Continue taking 1/2 tablet daily except 1 tablet on Sundays and Thursdays. Recheck INR in 3 weeks. Call us with any concerns,  Main [phone] Coumadin Clinic [phone]. Please call once you have coloscopy scheduled.

## 2019-01-11 ENCOUNTER — Other Ambulatory Visit: Payer: Self-pay | Admitting: Cardiology

## 2019-01-11 ENCOUNTER — Telehealth: Payer: Self-pay | Admitting: Cardiology

## 2019-01-11 NOTE — Telephone Encounter (Signed)
Pt c/o medication issue:  1. Name of Medication: Prednisone 5 MG   2. How are you currently taking this medication (dosage and times per day)? Starts taking medication 01/12/19 once daily for 5 days  3. Are you having a reaction (difficulty breathing--STAT)? No   4. What is your medication issue? Patient is calling in regards to her starting to take  Prednisone 5 MG on 01/12/19 for 5 days wondering if it will interfere with her Coumadin appointment on 01/20/19. Please advise.

## 2019-01-11 NOTE — Telephone Encounter (Signed)
Returned call to pt. She states she has prednisone 5mg  tablets and will be taking 30mg , 25mg , 20mg , 15mg , 10mg , and then 5mg  on the last day. Clinic is closed for Thanksgiving, will not be able to check her INR during her taper at all. Advised her to cut back her Thursday warfarin dose from 1 tablet to 1/2 tablet, and to eat extra greens for the next few days. She verbalized understanding. Will keep INR appt next week as scheduled.

## 2019-01-20 ENCOUNTER — Other Ambulatory Visit: Payer: Self-pay

## 2019-01-20 ENCOUNTER — Ambulatory Visit (INDEPENDENT_AMBULATORY_CARE_PROVIDER_SITE_OTHER): Payer: Medicare Other | Admitting: *Deleted

## 2019-01-20 DIAGNOSIS — Z5181 Encounter for therapeutic drug level monitoring: Secondary | ICD-10-CM | POA: Diagnosis not present

## 2019-01-20 DIAGNOSIS — I2699 Other pulmonary embolism without acute cor pulmonale: Secondary | ICD-10-CM | POA: Diagnosis not present

## 2019-01-20 LAB — POCT INR: INR: 2.5 (ref 2.0–3.0)

## 2019-01-20 NOTE — Patient Instructions (Addendum)
  Description   Continue taking 1/2 tablet daily except 1 tablet on Sundays and Thursdays. Recheck INR in 4 weeks. Call us with any concerns,  Main [phone] Coumadin Clinic [phone]. Please call once you have coloscopy scheduled.

## 2019-02-15 ENCOUNTER — Telehealth: Payer: Self-pay | Admitting: *Deleted

## 2019-02-15 NOTE — Telephone Encounter (Signed)
This patient was previously cleared for this procedure by Dr Marlou Porch in Nov with recommendations to hold Coumadin 5 days pre op with Lovenox bridging.  Timing of her procedure is sometime in Jan or Feb.  Once her procedure is scheduled please let us know so we can check with her 30 days pre op for final clearance. I'll forward to our pharmacy department as well so they can arrange for Lovenox once her surgery date is set.   Kerin Ransom PA-C 02/15/2019 4:12 PM

## 2019-02-15 NOTE — Telephone Encounter (Signed)
   [facility] Pre-operative Risk Assessment    Request for surgical clearance: THIS IS A NEW REQUEST. LOOKS LIKE PROCEDURE WAS POSTPONED TO JAN/FEB 2021  1. What type of surgery is being performed? COLONOSCOPY/ENDOSCOPY   2. When is this surgery scheduled? TBD (SOMETIME JAN OR FEB)   3. What type of clearance is required (medical clearance vs. Pharmacy clearance to hold med vs. Both)? BOTH  4. Are there any medications that need to be held prior to surgery and how long?COUMADIN X 5 DAYS PRIOR TO PROCEDURE   5. Practice name and name of physician performing surgery? [facility]; DR. Alessandra Bevels   6. What is your office phone number [phone]    7.   What is your office fax number [phone]  8.   Anesthesia type (None, local, MAC, general) ? PROPOFOL    Julaine Hua 02/15/2019, 3:42 PM  _________________________________________________________________   (provider comments below)

## 2019-02-23 ENCOUNTER — Ambulatory Visit: Payer: Medicare Other

## 2019-02-24 NOTE — Progress Notes (Signed)
CARDIOLOGY OFFICE NOTE  Date:  03/01/2019    Amy Rivers Date of Birth: Jul 16, 1965 Medical Record #[id number]  PCP:  Jackie Plum, MD  Cardiologist:  Rick Duff  Chief Complaint  Patient presents with  . Follow-up    Seen for Dr. Anne Fu    History of Present Illness: Amy Rivers is a 54 y.o. female who presents today for a 2 month check. Seen for Dr. Anne Fu.   She has a history of CAD with prior CABG x 1, HLD, and recurrent PE - on chronic anticoagulation on Coumadin. She has a history of smoking, chronic fatigue. Has been tried on Eliquis but unsuccessful. Has had tendency towards chest pain. She has not tolerated Imdur. She does use some prn NTG. She was asking about colonoscopy and was given the ok with Lovenox bridging. He added back Imdur (to take at night) along with increase in her beta blocker at her last visit.   The patient does not have symptoms concerning for COVID-19 infection (fever, chills, cough, or new shortness of breath).   Comes in today. Here alone. She is not sure how she is doing. She has ran out of her GERD medicine - not sure how long she has been out of - does not know when she last had refilled. She notes her memory is "bad". She is wanting an aid - she had this before in [place]. Apparently she is eligible but this has not come to fruition here in [place]. Her history is a little hard to follow. She notes she is just scared - wants to not go there but "has a lot of stuff wrong with me". She has had bouts of indigestion. Got scared. Took antacid medicine for stomach pain. This was a few days ago.  Her OTC med is not really helping. She admits she is nervous and panicky. She thinks her PCP has done recent labs - she is not able to tell me who this is - can't remember.   Past Medical History:  Diagnosis Date  . Allergic rhinitis   . Anticoagulant long-term use   . Bipolar 1 disorder (HCC)   . CAD (coronary artery disease), native coronary  artery    ANGINA PRESENCE UNSPECIFIED  . CHF (congestive heart failure) (HCC)   . COPD (chronic obstructive pulmonary disease) (HCC)   . DDD (degenerative disc disease), lumbar   . Fibromyalgia   . Fibromyalgia   . GERD (gastroesophageal reflux disease)   . H/O degenerative disc disease   . Hx of tobacco use, presenting hazards to health   . Hyperlipidemia   . Hypertension   . Low back pain   . Obesity (BMI 30.0-34.9)   . Other chronic pain   . Pulmonary embolism (HCC)   . Scoliosis     Past Surgical History:  Procedure Laterality Date  . ABDOMINAL HYSTERECTOMY    . ABDOMINAL SURGERY    . CARDIAC SURGERY       Medications: Current Meds  Medication Sig  . amLODipine (NORVASC) 2.5 MG tablet Take 2.5 mg by mouth daily.  Marland Kitchen atorvastatin (LIPITOR) 80 MG tablet Take 80 mg by mouth at bedtime.  Marland Kitchen buPROPion (WELLBUTRIN SR) 150 MG 12 hr tablet Take 150 mg by mouth 2 (two) times daily.  Marland Kitchen gabapentin (NEURONTIN) 100 MG capsule Take 100 mg by mouth 2 (two) times daily.   . hydrOXYzine (ATARAX/VISTARIL) 50 MG tablet Take 50 mg by mouth as needed.  . metoprolol tartrate (LOPRESSOR)  50 MG tablet Take 1 tablet (50 mg total) by mouth 2 (two) times daily.  . montelukast (SINGULAIR) 10 MG tablet Take 10 mg by mouth daily.  . nitroGLYCERIN (NITROSTAT) 0.4 MG SL tablet Place 1 tablet (0.4 mg total) under the tongue every 5 (five) minutes x 3 doses as needed for chest pain.  Marland Kitchen QUEtiapine (SEROQUEL) 200 MG tablet Take 200 mg by mouth at bedtime.  . topiramate (TOPAMAX) 100 MG tablet Take 100 mg by mouth daily.  Marland Kitchen topiramate (TOPAMAX) 25 MG tablet Take 25 mg by mouth at bedtime.  . traMADol (ULTRAM) 50 MG tablet Take 50 mg by mouth as needed.  . VENTOLIN HFA 108 (90 Base) MCG/ACT inhaler Inhale 2 puffs into the lungs 2 (two) times daily.   Marland Kitchen VRAYLAR 4.5 MG CAPS Take 1 capsule by mouth daily.  Marland Kitchen warfarin (COUMADIN) 5 MG tablet Take 1/2 tablet daily except 1 tablet on Sunday and Thursday or TAKE AS  DIRECTED BY COUMADIN CLINIC  . [DISCONTINUED] nitroGLYCERIN (NITROSTAT) 0.4 MG SL tablet Place 1 tablet (0.4 mg total) under the tongue every 5 (five) minutes x 3 doses as needed for chest pain.     Allergies: Allergies  Allergen Reactions  . Keflet [Cephalexin] Anaphylaxis  . Morphine And Related Hives, Shortness Of Breath, Nausea And Vomiting and Rash  . Sulfa Antibiotics Anaphylaxis  . Iodine Hives  . Strawberry Extract Hives and Nausea Only  . Cymbalta [Duloxetine Hcl] Hives and Rash    Social History: The patient  reports that she has been smoking. She has never used smokeless tobacco. She reports that she does not drink alcohol or use drugs.   Family History: The patient's family history includes Arthritis in her father; Hepatitis in her mother.   Review of Systems: Please see the history of present illness.   All other systems are reviewed and negative.   Physical Exam: VS:  BP 102/60   Pulse 70   Ht 5' 4.5" (1.638 m)   Wt 204 lb 12.8 oz (92.9 kg)   LMP  (LMP Unknown)   SpO2 98%   BMI 34.61 kg/m  .  BMI Body mass index is 34.61 kg/m.  Wt Readings from Last 3 Encounters:  03/01/19 204 lb 12.8 oz (92.9 kg)  12/30/18 206 lb (93.4 kg)  07/06/18 213 lb (96.6 kg)    General: Alert and in no acute distress. Very tangential - got tearful during the visit. Very emotional at times.  HEENT: Normal.  Neck: Supple, no JVD, carotid bruits, or masses noted.  Cardiac: Regular rate and rhythm. No murmurs, rubs, or gallops. No edema.  Respiratory:  Lungs are clear to auscultation bilaterally with normal work of breathing.  GI: Soft and nontender.  MS: No deformity or atrophy. Gait and ROM intact.  Skin: Warm and dry. Color is normal.  Neuro:  Strength and sensation are intact and no gross focal deficits noted.  Psych: Alert, appropriate and with normal affect.   LABORATORY DATA:  EKG:  EKG is ordered today. This demonstrates NSR.  Lab Results  Component Value Date    WBC 5.7 06/03/2017   HGB 12.0 06/03/2017   HCT 38.3 06/03/2017   PLT 298 06/03/2017   GLUCOSE 68 06/03/2017   CHOL 139 06/03/2017   TRIG 88 06/03/2017   HDL 47 06/03/2017   LDLCALC 74 06/03/2017   ALT 11 06/03/2017   AST 21 06/03/2017   NA 144 06/03/2017   K 4.4 06/03/2017   CL 108 (  H) 06/03/2017   CREATININE 0.88 06/03/2017   BUN 8 06/03/2017   CO2 22 06/03/2017   INR 2.5 01/20/2019     BNP (last 3 results) No results for input(s): BNP in the last 8760 hours.  ProBNP (last 3 results) No results for input(s): PROBNP in the last 8760 hours.   Other Studies Reviewed Today:  Nuclear stress test 06/16/2017:  Nuclear stress EF: 57%.  During infusion there was accentuation of the T wave abnormalities noted on baseline EKG  The study is normal.  This is a low risk study.  The left ventricular ejection fraction is normal (55-65%).   ASSESSMENT & PLAN:    1. CAD with prior CABG - low risk Myoview from 2019 - seems to be having more GI issues - she is not able to tolerate Imdur due to headache. On low dose Norvasc - BP is soft. Adding back PPI today. EKG actually looks better today.   2. HTN - BP looks good - no changes made today.   3. HLD - on statin therapy - she says she has had recent labs with her new PCP  4. Tobacco abuse - she is not ready to stop - clearly a means of her being able to cope with life.   5.  Prior PE - on coumadin.   6. Chronic anticoagulation - rechecking INR today.   7. Chronically abnormal EKG - EKG today looks better - less T wave changes.   8. GERD - adding back PPI therapy today - she was given clearance back at last visit for EGD/colonoscopy - she says this is still on hold - not really clear as to why - but if does proceed - would need bridging.   9. Bipolar illness - she admits to memory issues - she is going to see if her daughter will help oversee her medicines. I suspect there is some compliance due to her memory issues.   10.  COVID-19 Education: The signs and symptoms of COVID-19 were discussed with the patient and how to seek care for testing (follow up with PCP or arrange E-visit).  The importance of social distancing, staying at home, hand hygiene and wearing a mask when out in public were discussed today.  Current medicines are reviewed with the patient today.  The patient does not have concerns regarding medicines other than what has been noted above.  The following changes have been made:  See above.  Labs/ tests ordered today include:    Orders Placed This Encounter  Procedures  . EKG 12-Lead     Disposition:   FU with Dr. Marlou Porch in 2 to 3 months.   Patient is agreeable to this plan and will call if any problems develop in the interim.   SignedTruitt Merle, NP  03/01/2019 3:38 PM  [facility] [address] [place], [place]  [address] Phone: [phone] Fax: [phone]

## 2019-03-01 ENCOUNTER — Encounter (INDEPENDENT_AMBULATORY_CARE_PROVIDER_SITE_OTHER): Payer: Self-pay

## 2019-03-01 ENCOUNTER — Ambulatory Visit (INDEPENDENT_AMBULATORY_CARE_PROVIDER_SITE_OTHER): Payer: Medicare Other

## 2019-03-01 ENCOUNTER — Encounter: Payer: Self-pay | Admitting: Nurse Practitioner

## 2019-03-01 ENCOUNTER — Other Ambulatory Visit: Payer: Self-pay

## 2019-03-01 ENCOUNTER — Ambulatory Visit (INDEPENDENT_AMBULATORY_CARE_PROVIDER_SITE_OTHER): Payer: Medicare Other | Admitting: Nurse Practitioner

## 2019-03-01 VITALS — BP 102/60 | HR 70 | Ht 64.5 in | Wt 204.8 lb

## 2019-03-01 DIAGNOSIS — Z86711 Personal history of pulmonary embolism: Secondary | ICD-10-CM | POA: Diagnosis not present

## 2019-03-01 DIAGNOSIS — E782 Mixed hyperlipidemia: Secondary | ICD-10-CM

## 2019-03-01 DIAGNOSIS — I2699 Other pulmonary embolism without acute cor pulmonale: Secondary | ICD-10-CM

## 2019-03-01 DIAGNOSIS — Z7901 Long term (current) use of anticoagulants: Secondary | ICD-10-CM

## 2019-03-01 DIAGNOSIS — Z720 Tobacco use: Secondary | ICD-10-CM | POA: Diagnosis not present

## 2019-03-01 DIAGNOSIS — I2510 Atherosclerotic heart disease of native coronary artery without angina pectoris: Secondary | ICD-10-CM

## 2019-03-01 DIAGNOSIS — Z5181 Encounter for therapeutic drug level monitoring: Secondary | ICD-10-CM | POA: Diagnosis not present

## 2019-03-01 DIAGNOSIS — I10 Essential (primary) hypertension: Secondary | ICD-10-CM

## 2019-03-01 DIAGNOSIS — Z7189 Other specified counseling: Secondary | ICD-10-CM

## 2019-03-01 LAB — POCT INR: INR: 2.9 (ref 2.0–3.0)

## 2019-03-01 MED ORDER — NITROGLYCERIN 0.4 MG SL SUBL: 0.4000 mg | SUBLINGUAL_TABLET | SUBLINGUAL | 3 refills | Status: AC | PRN

## 2019-03-01 MED ORDER — PANTOPRAZOLE SODIUM 40 MG PO TBEC: 40.0000 mg | DELAYED_RELEASE_TABLET | Freq: Every day | ORAL | 3 refills | Status: AC

## 2019-03-01 NOTE — Patient Instructions (Signed)
Description   Continue taking 1/2 tablet daily except 1 tablet on Sundays and Thursdays. Recheck INR in 4 weeks. Call us with any concerns,  Main [phone] Coumadin Clinic [phone]. Please call once you have coloscopy scheduled.

## 2019-03-01 NOTE — Patient Instructions (Addendum)
After Visit Summary:  We will be checking the following labs today - NONE   Medication Instructions:    Continue with your current medicines. BUT   I am putting you back on the Protonix - this will help with your stomach and reflux.   If you need a refill on your cardiac medications before your next appointment, please call your pharmacy.     Testing/Procedures To Be Arranged:  N/A  Follow-Up:   See Dr. Anne Fu in 2 to 3 months.     At [facility], you and your health needs are our priority.  As part of our continuing mission to provide you with exceptional heart care, we have created designated Provider Care Teams.  These Care Teams include your primary Cardiologist (physician) and Advanced Practice Providers (APPs -  Physician Assistants and Nurse Practitioners) who all work together to provide you with the care you need, when you need it.  Special Instructions:  . Stay safe, stay home, wash your hands for at least 20 seconds and wear a mask when out in public.  . It was good to talk with you today.  . Try to work on your smoking.    Call the [facility] office at [phone] if you have any questions, problems or concerns.

## 2019-03-09 ENCOUNTER — Other Ambulatory Visit: Payer: Self-pay | Admitting: Gastroenterology

## 2019-03-10 ENCOUNTER — Telehealth: Payer: Self-pay | Admitting: *Deleted

## 2019-03-10 NOTE — Telephone Encounter (Signed)
See clearance notes back 02/15/19. Dr. Moss Mc GI. Pt is now scheduled for 04/07/19. Ph# [phone]; fax# [phone]. NOTE the fax number I was given today is different than what was on the 02/15/19 clearance. No need to duplicate surg clearance request, please see 02/15/20 clearance request.

## 2019-03-10 NOTE — Telephone Encounter (Signed)
Dr. Moss Mc GI. Pt is now scheduled for 04/07/19. Ph# [phone]; fax# [phone]. NOTE the fax number I was given today is different than what was on the 02/15/19     Primary Cardiologist: Donato Schultz, MD  Chart reviewed as part of pre-operative protocol coverage. Patient was contacted 03/10/2019 in reference to pre-operative risk assessment for pending surgery as outlined below.  Evani Shrider was last seen on 03/01/2019 by Norma Fredrickson, NP.  Since that day, Hiilei Gerst has done well with no new cardiac complaitns.  Therefore, based on ACC/AHA guidelines, the patient would be at acceptable risk for the planned procedure without further cardiovascular testing.   The procedure has now been scheduled for  04/07/19. She is on warfarin for history or recurrent PE. She will need to hold warfarin for 5 days prior to the procedure with Lovenox bridging. This will be arranged at her coumadin clinic appointment on 03/29/19. The patient is aware.   I will route this recommendation to the requesting party via Epic fax function and remove from pre-op pool.  Please call with questions.  Berton Bon, NP 03/10/2019, 1:14 PM

## 2019-03-29 ENCOUNTER — Ambulatory Visit (INDEPENDENT_AMBULATORY_CARE_PROVIDER_SITE_OTHER): Payer: Medicare Other | Admitting: *Deleted

## 2019-03-29 ENCOUNTER — Other Ambulatory Visit: Payer: Self-pay

## 2019-03-29 DIAGNOSIS — I2699 Other pulmonary embolism without acute cor pulmonale: Secondary | ICD-10-CM

## 2019-03-29 DIAGNOSIS — Z5181 Encounter for therapeutic drug level monitoring: Secondary | ICD-10-CM

## 2019-03-29 LAB — POCT INR: INR: 2.3 (ref 2.0–3.0)

## 2019-03-29 MED ORDER — ENOXAPARIN SODIUM 150 MG/ML ~~LOC~~ SOLN: 150.0000 mg | SUBCUTANEOUS | 1 refills | Status: AC

## 2019-03-29 NOTE — Patient Instructions (Addendum)
04/01/2019: Last dose of Coumadin.  04/02/2019: No Coumadin or Lovenox.  04/03/2019: Inject Lovenox 150mg  in the fatty abdominal tissue at least 2 inches from the belly button daily, 8pm rotate sites. No Coumadin.  04/04/2019: Inject Lovenox in the fatty tissue every at 8pm. No Coumadin.  04/05/2019: Inject Lovenox in the fatty tissue every at 8pm. No Coumadin.   04/06/2019: No Lovenox and No Coumadin.  04/07/2019: Procedure Day - No Lovenox - Resume Coumadin in the evening or as directed by doctor (take an extra half tablet with usual dose).  04/08/2019: Resume Lovenox inject in the fatty tissue at 8am and take Coumadin (take an extra half tablet with usual dose).   04/09/2019: Inject Lovenox in the fatty tissue at 8am and take Coumadin.  04/10/2019: Inject Lovenox in the fatty tissue at 8am and take Coumadin.  04/11/2019: Inject Lovenox in the fatty tissue at 8am and take Coumadin.  04/12/2019: Inject Lovenox in the fatty tissue at 8am and take Coumadin.  04/13/2019: Inject Lovenox in the fatty tissue at 8am and report to Coumadin appt to check INR.    Description   Continue taking 1/2 tablet daily except 1 tablet on Sundays and Thursdays. Recheck INR 1 week post procedure. Follow bridge instructions. Call [phone] with any concerns,  Main [phone] Cou[facility]in Clinic [phone].

## 2019-04-04 ENCOUNTER — Other Ambulatory Visit (HOSPITAL_COMMUNITY)
Admission: RE | Admit: 2019-04-04 | Discharge: 2019-04-04 | Disposition: A | Discharge status: Home / Self Care | Payer: Medicare Other | Source: Ambulatory Visit | Attending: Gastroenterology | Admitting: Gastroenterology

## 2019-04-04 DIAGNOSIS — Z20822 Contact with and (suspected) exposure to covid-19: Secondary | ICD-10-CM | POA: Insufficient documentation

## 2019-04-04 DIAGNOSIS — Z01812 Encounter for preprocedural laboratory examination: Secondary | ICD-10-CM | POA: Diagnosis present

## 2019-04-04 LAB — SARS CORONAVIRUS 2 (TAT 6-24 HRS): SARS Coronavirus 2: NEGATIVE

## 2019-04-07 ENCOUNTER — Encounter (HOSPITAL_COMMUNITY)
Admission: RE | Disposition: A | Discharge status: Home / Self Care | Payer: Self-pay | Source: Home / Self Care | Attending: Gastroenterology

## 2019-04-07 ENCOUNTER — Ambulatory Visit (HOSPITAL_COMMUNITY)
Admission: RE | Admit: 2019-04-07 | Discharge: 2019-04-07 | Disposition: A | Discharge status: Home / Self Care | Payer: Medicare Other | Attending: Gastroenterology | Admitting: Gastroenterology

## 2019-04-07 ENCOUNTER — Ambulatory Visit (HOSPITAL_COMMUNITY): Payer: Medicare Other | Admitting: Certified Registered Nurse Anesthetist

## 2019-04-07 ENCOUNTER — Other Ambulatory Visit: Payer: Self-pay

## 2019-04-07 ENCOUNTER — Encounter (HOSPITAL_COMMUNITY): Payer: Self-pay | Admitting: Gastroenterology

## 2019-04-07 DIAGNOSIS — E669 Obesity, unspecified: Secondary | ICD-10-CM | POA: Diagnosis not present

## 2019-04-07 DIAGNOSIS — Z91018 Allergy to other foods: Secondary | ICD-10-CM | POA: Insufficient documentation

## 2019-04-07 DIAGNOSIS — M5136 Other intervertebral disc degeneration, lumbar region: Secondary | ICD-10-CM | POA: Insufficient documentation

## 2019-04-07 DIAGNOSIS — Z6841 Body Mass Index (BMI) 40.0 and over, adult: Secondary | ICD-10-CM | POA: Insufficient documentation

## 2019-04-07 DIAGNOSIS — J449 Chronic obstructive pulmonary disease, unspecified: Secondary | ICD-10-CM | POA: Diagnosis not present

## 2019-04-07 DIAGNOSIS — K219 Gastro-esophageal reflux disease without esophagitis: Secondary | ICD-10-CM | POA: Diagnosis not present

## 2019-04-07 DIAGNOSIS — Z79899 Other long term (current) drug therapy: Secondary | ICD-10-CM | POA: Diagnosis not present

## 2019-04-07 DIAGNOSIS — K5730 Diverticulosis of large intestine without perforation or abscess without bleeding: Secondary | ICD-10-CM | POA: Insufficient documentation

## 2019-04-07 DIAGNOSIS — K648 Other hemorrhoids: Secondary | ICD-10-CM | POA: Insufficient documentation

## 2019-04-07 DIAGNOSIS — K5900 Constipation, unspecified: Secondary | ICD-10-CM | POA: Insufficient documentation

## 2019-04-07 DIAGNOSIS — R1310 Dysphagia, unspecified: Secondary | ICD-10-CM | POA: Diagnosis not present

## 2019-04-07 DIAGNOSIS — I110 Hypertensive heart disease with heart failure: Secondary | ICD-10-CM | POA: Diagnosis not present

## 2019-04-07 DIAGNOSIS — M419 Scoliosis, unspecified: Secondary | ICD-10-CM | POA: Insufficient documentation

## 2019-04-07 DIAGNOSIS — E785 Hyperlipidemia, unspecified: Secondary | ICD-10-CM | POA: Insufficient documentation

## 2019-04-07 DIAGNOSIS — K625 Hemorrhage of anus and rectum: Secondary | ICD-10-CM | POA: Insufficient documentation

## 2019-04-07 DIAGNOSIS — F319 Bipolar disorder, unspecified: Secondary | ICD-10-CM | POA: Diagnosis not present

## 2019-04-07 DIAGNOSIS — I509 Heart failure, unspecified: Secondary | ICD-10-CM | POA: Diagnosis not present

## 2019-04-07 DIAGNOSIS — Z86711 Personal history of pulmonary embolism: Secondary | ICD-10-CM | POA: Insufficient documentation

## 2019-04-07 DIAGNOSIS — M797 Fibromyalgia: Secondary | ICD-10-CM | POA: Insufficient documentation

## 2019-04-07 DIAGNOSIS — F17200 Nicotine dependence, unspecified, uncomplicated: Secondary | ICD-10-CM | POA: Diagnosis not present

## 2019-04-07 DIAGNOSIS — Z7901 Long term (current) use of anticoagulants: Secondary | ICD-10-CM | POA: Diagnosis not present

## 2019-04-07 DIAGNOSIS — Z8371 Family history of colonic polyps: Secondary | ICD-10-CM | POA: Diagnosis not present

## 2019-04-07 DIAGNOSIS — Z8261 Family history of arthritis: Secondary | ICD-10-CM | POA: Diagnosis not present

## 2019-04-07 DIAGNOSIS — Z90710 Acquired absence of both cervix and uterus: Secondary | ICD-10-CM | POA: Insufficient documentation

## 2019-04-07 DIAGNOSIS — I2510 Atherosclerotic heart disease of native coronary artery without angina pectoris: Secondary | ICD-10-CM | POA: Insufficient documentation

## 2019-04-07 DIAGNOSIS — Z91041 Radiographic dye allergy status: Secondary | ICD-10-CM | POA: Insufficient documentation

## 2019-04-07 DIAGNOSIS — K2970 Gastritis, unspecified, without bleeding: Secondary | ICD-10-CM | POA: Insufficient documentation

## 2019-04-07 DIAGNOSIS — Z8379 Family history of other diseases of the digestive system: Secondary | ICD-10-CM | POA: Insufficient documentation

## 2019-04-07 DIAGNOSIS — Z888 Allergy status to other drugs, medicaments and biological substances status: Secondary | ICD-10-CM | POA: Insufficient documentation

## 2019-04-07 HISTORY — PX: ESOPHAGOGASTRODUODENOSCOPY (EGD) WITH PROPOFOL: SHX5813

## 2019-04-07 HISTORY — PX: MALONEY DILATION: SHX5535

## 2019-04-07 HISTORY — PX: BIOPSY: SHX5522

## 2019-04-07 HISTORY — PX: COLONOSCOPY WITH PROPOFOL: SHX5780

## 2019-04-07 SURGERY — COLONOSCOPY WITH PROPOFOL
Anesthesia: Monitor Anesthesia Care

## 2019-04-07 MED ORDER — LACTATED RINGERS IV SOLN
INTRAVENOUS | Status: DC
  Administered 2019-04-07: 1000 mL via INTRAVENOUS

## 2019-04-07 MED ORDER — PROPOFOL 10 MG/ML IV BOLUS
INTRAVENOUS | Status: DC | PRN
  Administered 2019-04-07: 100 mg via INTRAVENOUS

## 2019-04-07 MED ORDER — EPHEDRINE SULFATE 50 MG/ML IJ SOLN
INTRAMUSCULAR | Status: DC | PRN
  Administered 2019-04-07: 10 mg via INTRAVENOUS

## 2019-04-07 MED ORDER — SODIUM CHLORIDE 0.9 % IV SOLN: INTRAVENOUS | Status: DC

## 2019-04-07 MED ORDER — LIDOCAINE 2% (20 MG/ML) 5 ML SYRINGE
INTRAMUSCULAR | Status: DC | PRN
  Administered 2019-04-07: 60 mg via INTRAVENOUS

## 2019-04-07 MED ORDER — PROPOFOL 500 MG/50ML IV EMUL
INTRAVENOUS | Status: DC | PRN
  Administered 2019-04-07: 125 ug/kg/min via INTRAVENOUS

## 2019-04-07 SURGICAL SUPPLY — 24 items

## 2019-04-07 NOTE — Anesthesia Preprocedure Evaluation (Addendum)
Anesthesia Evaluation  Patient identified by MRN, date of birth, ID band Patient awake    Reviewed: Allergy & Precautions, NPO status , Patient's Chart, lab work & pertinent test results, reviewed documented beta blocker date and time   Airway Mallampati: II  TM Distance: >3 FB Neck ROM: Full    Dental  (+) Teeth Intact, Dental Advisory Given   Pulmonary COPD,  COPD inhaler, Current Smoker and Patient abstained from smoking., PE   Pulmonary exam normal breath sounds clear to auscultation       Cardiovascular hypertension, Pt. on medications and Pt. on home beta blockers + CAD and +CHF  Normal cardiovascular exam Rhythm:Regular Rate:Normal     Neuro/Psych PSYCHIATRIC DISORDERS Bipolar Disorder negative neurological ROS     GI/Hepatic Neg liver ROS, GERD  Medicated, Rectal Bleeding, Esophageal Dysphagia   Endo/Other  negative endocrine ROS  Renal/GU negative Renal ROS     Musculoskeletal  (+) Arthritis , Fibromyalgia -  Abdominal   Peds  Hematology  (+) Blood dyscrasia (Warfarin), ,   Anesthesia Other Findings Day of surgery medications reviewed with the patient.  Reproductive/Obstetrics                            Anesthesia Physical Anesthesia Plan  ASA: III  Anesthesia Plan: MAC   Post-op Pain Management:    Induction: Intravenous  PONV Risk Score and Plan: 1 and Propofol infusion and Treatment may vary due to age or medical condition  Airway Management Planned: Natural Airway and Nasal Cannula  Additional Equipment:   Intra-op Plan:   Post-operative Plan:   Informed Consent: I have reviewed the patients History and Physical, chart, labs and discussed the procedure including the risks, benefits and alternatives for the proposed anesthesia with the patient or authorized representative who has indicated his/her understanding and acceptance.     Dental advisory given  Plan  Discussed with: CRNA  Anesthesia Plan Comments:        Anesthesia Quick Evaluation

## 2019-04-07 NOTE — H&P (Signed)
Primary Care Physician:  Vladimir Crofts, FNP Primary Gastroenterologist:  Dr. Levora Angel  Reason for Visit : Rectal bleeding and dysphagia  HPI: Amy Rivers is a 54 y.o. female was seen in the office for rectal bleeding and dysphagia.. Currently on pantoprazole. Complaining of ongoing reflux symptoms despite of taking pantoprazole. Also complaining of sensation of food getting stuck in the midesophagus. Mostly solid foods. Complaining of intermittent rectal bleeding mostly after constipation.  Complaining of diarrhea alternating with constipation. Denies unintentional weight loss.        Had colonoscopy around 5-6 years ago in [place] which was normal according to patient. Family history of colon polyps in mother. No known family history of colon cancer.  Past Medical History:  Diagnosis Date  . Allergic rhinitis   . Anticoagulant long-term use   . Bipolar 1 disorder (HCC)   . CAD (coronary artery disease), native coronary artery    ANGINA PRESENCE UNSPECIFIED  . CHF (congestive heart failure) (HCC)   . COPD (chronic obstructive pulmonary disease) (HCC)   . DDD (degenerative disc disease), lumbar   . Fibromyalgia   . Fibromyalgia   . GERD (gastroesophageal reflux disease)   . H/O degenerative disc disease   . Hx of tobacco use, presenting hazards to health   . Hyperlipidemia   . Hypertension   . Low back pain   . Obesity (BMI 30.0-34.9)   . Other chronic pain   . Pulmonary embolism (HCC)   . Scoliosis     Past Surgical History:  Procedure Laterality Date  . ABDOMINAL HYSTERECTOMY    . ABDOMINAL SURGERY    . CARDIAC SURGERY      Prior to Admission medications   Medication Sig Start Date End Date Taking? Authorizing Provider  albuterol (ACCUNEB) 1.25 MG/3ML nebulizer solution Take 1 ampule by nebulization every 6 (six) hours as needed for wheezing.   Yes [provider]  amLODipine (NORVASC) 2.5 MG tablet Take 2.5 mg by mouth daily.   Yes [provider]   atorvastatin (LIPITOR) 80 MG tablet Take 80 mg by mouth at bedtime. 10/03/16  Yes [provider]  dicyclomine (BENTYL) 20 MG tablet Take 20 mg by mouth daily as needed (stomach pain).  03/14/19  Yes [provider]  gabapentin (NEURONTIN) 100 MG capsule Take 100 mg by mouth 3 (three) times daily.    Yes [provider]  hydrOXYzine (ATARAX/VISTARIL) 50 MG tablet Take 50 mg by mouth at bedtime.  01/24/19  Yes [provider]  Melatonin 5 MG TABS Take 5 mg by mouth at bedtime as needed (Sleep).   Yes [provider]  metoprolol tartrate (LOPRESSOR) 50 MG tablet Take 1 tablet (50 mg total) by mouth 2 (two) times daily. 12/30/18  Yes Jake Bathe, MD  montelukast (SINGULAIR) 10 MG tablet Take 10 mg by mouth daily. 12/24/18  Yes [provider]  nitroGLYCERIN (NITROSTAT) 0.4 MG SL tablet Place 1 tablet (0.4 mg total) under the tongue every 5 (five) minutes x 3 doses as needed for chest pain. 03/01/19  Yes Rosalio Macadamia, NP  pantoprazole (PROTONIX) 40 MG tablet Take 1 tablet (40 mg total) by mouth daily. 03/01/19  Yes Rosalio Macadamia, NP  polyethylene glycol powder (GLYCOLAX/MIRALAX) 17 GM/SCOOP powder Take 17 g by mouth daily. 03/09/19  Yes [provider]  prazosin (MINIPRESS) 2 MG capsule Take 2 mg by mouth at bedtime. 03/01/19  Yes [provider]  QUEtiapine (SEROQUEL) 100 MG tablet Take 100 mg  by mouth at bedtime.  01/24/19  Yes [provider]  topiramate (TOPAMAX) 100 MG tablet Take 100 mg by mouth at bedtime.  01/24/19  Yes [provider]  traMADol (ULTRAM) 50 MG tablet Take 50 mg by mouth daily as needed for moderate pain or severe pain.  01/16/19  Yes [provider]  VENTOLIN HFA 108 (90 Base) MCG/ACT inhaler Inhale 2 puffs into the lungs 2 (two) times daily.  03/23/17  Yes [provider]  VRAYLAR 4.5 MG CAPS Take 4.5 mg by mouth daily.  02/03/19  Yes [provider]  warfarin  (COUMADIN) 5 MG tablet Take 1/2 tablet daily except 1 tablet on Sunday and Thursday or TAKE AS DIRECTED BY COUMADIN CLINIC Patient taking differently: Take 2.5-5 mg by mouth See admin instructions. Take 2.5 mg  tablet daily except 5 mg tablet on Sunday and Thursday or TAKE AS DIRECTED BY COUMADIN CLINIC 12/05/18  Yes Jake Bathe, MD  amlodipine-benazepril (LOTREL) 2.5-10 MG capsule Take 1 capsule by mouth daily. 03/22/19   [provider]  enoxaparin (LOVENOX) 150 MG/ML injection Inject 1 mL (150 mg total) into the skin daily. 03/29/19   Jake Bathe, MD    Scheduled Meds: Continuous Infusions: . sodium chloride     PRN Meds:.  Allergies as of 03/09/2019 - Review Complete 03/01/2019  Allergen Reaction Noted  . Keflet [cephalexin] Anaphylaxis 06/26/2016  . Morphine and related Hives, Shortness Of Breath, Nausea And Vomiting, and Rash 06/26/2016  . Sulfa antibiotics Anaphylaxis 05/24/2013  . Iodine Hives 07/22/2010  . Strawberry extract Hives and Nausea Only 10/05/2016  . Cymbalta [duloxetine hcl] Hives and Rash 06/26/2016    Family History  Problem Relation Age of Onset  . Hepatitis Mother        C  . Arthritis Father     Social History   Socioeconomic History  . Marital status: Widowed    Spouse name: Not on file  . Number of children: Not on file  . Years of education: Not on file  . Highest education level: Not on file  Occupational History  . Not on file  Tobacco Use  . Smoking status: Current Every Day Smoker  . Smokeless tobacco: Never Used  Substance and Sexual Activity  . Alcohol use: No  . Drug use: No  . Sexual activity: Not on file  Other Topics Concern  . Not on file  Social History Narrative  . Not on file   Social Determinants of Health   Financial Resource Strain:   . Difficulty of Paying Living Expenses: Not on file  Food Insecurity:   . Worried About Programme researcher, broadcasting/film/video in the Last Year: Not on file  . Ran Out of Food in the Last  Year: Not on file  Transportation Needs:   . Lack of Transportation (Medical): Not on file  . Lack of Transportation (Non-Medical): Not on file  Physical Activity:   . Days of Exercise per Week: Not on file  . Minutes of Exercise per Session: Not on file  Stress:   . Feeling of Stress : Not on file  Social Connections:   . Frequency of Communication with Friends and Family: Not on file  . Frequency of Social Gatherings with Friends and Family: Not on file  . Attends Religious Services: Not on file  . Active Member of Clubs or Organizations: Not on file  . Attends Banker Meetings: Not on file  . Marital Status: Not  on file  Intimate Partner Violence:   . Fear of Current or Ex-Partner: Not on file  . Emotionally Abused: Not on file  . Physically Abused: Not on file  . Sexually Abused: Not on file    Review of Systems: All negative except as stated above in HPI.  Physical Exam: Vital signs: Vitals:   04/07/19 0747  BP: (!) 130/92  Pulse: 60  Resp: 20  Temp: 98.3 F (36.8 C)  SpO2: 99%     General:   Alert,  Well-developed, well-nourished, pleasant and cooperative in NAD Lungs:  Clear throughout to auscultation.   No wheezes, crackles, or rhonchi. No acute distress. Heart:  Regular rate and rhythm; no murmurs, clicks, rubs,  or gallops. Abdomen: Soft, nontender, nondistended, bowel sounds present Rectal:  Deferred  GI:  Lab Results: No results for input(s): WBC, HGB, HCT, PLT in the last 72 hours. BMET No results for input(s): NA, K, CL, CO2, GLUCOSE, BUN, CREATININE, CALCIUM in the last 72 hours. LFT No results for input(s): PROT, ALBUMIN, AST, ALT, ALKPHOS, BILITOT, BILIDIR, IBILI in the last 72 hours. PT/INR No results for input(s): LABPROT, INR in the last 72 hours.   Studies/Results: No results found.  Impression/Plan: Rectal bleeding. Esophageal dysphagia Chronic GERD History of pulmonary embolism.  Coumadin on hold since February 15.  Last  dose of Lovenox on February 17.  Recommendations ------------------------ -Proceed with EGD with dilation and colonoscopy today  Risks (bleeding, infection, bowel perforation that could require surgery, sedation-related changes in cardiopulmonary systems), benefits (identification and possible treatment of source of symptoms, exclusion of certain causes of symptoms), and alternatives (watchful waiting, radiographic imaging studies, empiric medical treatment)  were explained to patient in detail and patient wishes to proceed.    LOS: 0 days   Otis Brace  MD, FACP 04/07/2019, 8:22 AM  Contact #  [phone]

## 2019-04-07 NOTE — Transfer of Care (Signed)
Immediate Anesthesia Transfer of Care Note  Patient: Amy Rivers  Procedure(s) Performed: Procedure(s): COLONOSCOPY WITH PROPOFOL (N/A) ESOPHAGOGASTRODUODENOSCOPY (EGD) WITH PROPOFOL (N/A) BIOPSY MALONEY DILATION  Patient Location: Endo/Pacu  Anesthesia Type:MAC  Level of Consciousness: Alert, Awake, Oriented  Airway & Oxygen Therapy: Patient Spontanous Breathing  Post-op Assessment: Report given to RN  Post vital signs: Reviewed and stable  Last Vitals:  Vitals:   04/07/19 0747  BP: (!) 130/92  Pulse: 60  Resp: 20  Temp: 36.8 C  SpO2: 97%    Complications: No apparent anesthesia complications

## 2019-04-07 NOTE — Discharge Instructions (Signed)

## 2019-04-07 NOTE — Op Note (Signed)
[facility] Patient Name: Amy Rivers Procedure Date: 04/07/2019 MRN: [id number] Attending MD: Kathi Der , MD Date of Birth: 04-Jun-1965 CSN: [id number] Age: 54 Admit Type: Inpatient Procedure:                Upper GI endoscopy Indications:              Dysphagia Providers:                Kathi Der, MD, Estella Husk, RN, Dwain Sarna, RN, Wanita Chamberlain, Technician Referring MD:              Medicines:                Sedation Administered by an Anesthesia Professional Complications:            No immediate complications. Estimated Blood Loss:     Estimated blood loss was minimal. Procedure:                Pre-Anesthesia Assessment:                           - Prior to the procedure, a History and Physical                            was performed, and patient medications and                            allergies were reviewed. The patient's tolerance of                            previous anesthesia was also reviewed. The risks                            and benefits of the procedure and the sedation                            options and risks were discussed with the patient.                            All questions were answered, and informed consent                            was obtained. Prior Anticoagulants: The patient                            last took Coumadin (warfarin) 5 days and Lovenox                            (enoxaparin) 2 days prior to the procedure. ASA                            Grade Assessment: III - A patient with severe  systemic disease. After reviewing the risks and                            benefits, the patient was deemed in satisfactory                            condition to undergo the procedure.                           After obtaining informed consent, the endoscope was                            passed under direct vision. Throughout the   procedure, the patient's blood pressure, pulse, and                            oxygen saturations were monitored continuously. The                            GIF-H190 ([id number]) Olympus gastroscope was                            introduced through the mouth, and advanced to the                            second part of duodenum. The upper GI endoscopy was                            accomplished without difficulty. The patient                            tolerated the procedure well. Scope In: Scope Out: Findings:      The Z-line was regular and was found 36 cm from the incisors.      No endoscopic abnormality was evident in the esophagus to explain the       patient's complaint of dysphagia. It was decided, however, to proceed       with dilation of the entire esophagus. The scope was withdrawn. Dilation       was performed with a Maloney dilator with no resistance at 48 Fr. The       dilation site was examined following endoscope reinsertion and showed no       bleeding, mucosal tear or perforation.      Scattered mild inflammation characterized by congestion (edema) and       erythema was found in the gastric antrum and in the prepyloric region of       the stomach. Biopsies were taken with a cold forceps for histology.      The cardia and gastric fundus were normal on retroflexion.      The duodenal bulb, first portion of the duodenum and second portion of       the duodenum were normal. Impression:               - Z-line regular, 36 cm from the incisors.                           -  No endoscopic esophageal abnormality to explain                            patient's dysphagia. Esophagus dilated. Dilated.                           - Gastritis. Biopsied.                           - Normal duodenal bulb, first portion of the                            duodenum and second portion of the duodenum. Moderate Sedation:      Moderate (conscious) sedation was personally administered by an        anesthesia professional. The following parameters were monitored: oxygen       saturation, heart rate, blood pressure, and response to care. Recommendation:           - Patient has a contact number available for                            emergencies. The signs and symptoms of potential                            delayed complications were discussed with the                            patient. Return to normal activities tomorrow.                            Written discharge instructions were provided to the                            patient.                           - Resume previous diet.                           - Continue present medications.                           - Await pathology results.                           - Perform a colonoscopy today. Procedure Code(s):        --- Professional ---                           [phone], Esophagogastroduodenoscopy, flexible,                            transoral; with biopsy, single or multiple                           43450, Dilation of esophagus, by unguided sound or  bougie, single or multiple passes Diagnosis Code(s):        --- Professional ---                           R13.10, Dysphagia, unspecified                           K29.70, Gastritis, unspecified, without bleeding CPT copyright 2019 [employer]. All rights reserved. The codes documented in this report are preliminary and upon coder review may  be revised to meet current compliance requirements. Kathi Der, MD Kathi Der, MD 04/07/2019 9:16:45 AM Number of Addenda: 0

## 2019-04-07 NOTE — Op Note (Signed)
[facility] Patient Name: Amy Rivers Procedure Date: 04/07/2019 MRN: [id number] Attending MD: Kathi Der , MD Date of Birth: 12/02/1965 CSN: [id number] Age: 54 Admit Type: Inpatient Procedure:                Colonoscopy Indications:              Last colonoscopy: date unknown (unable to locate                            last colonoscopy report), Last colonoscopy 5 years                            ago, Rectal bleeding, Family history of colonic                            polyps in a first-degree relative Providers:                Kathi Der, MD, Estella Husk, RN, Dwain Sarna, RN, Wanita Chamberlain, Technician Referring MD:              Medicines:                Sedation Administered by an Anesthesia Professional Complications:            No immediate complications. Estimated Blood Loss:     Estimated blood loss was minimal. Procedure:                Pre-Anesthesia Assessment:                           - Prior to the procedure, a History and Physical                            was performed, and patient medications and                            allergies were reviewed. The patient's tolerance of                            previous anesthesia was also reviewed. The risks                            and benefits of the procedure and the sedation                            options and risks were discussed with the patient.                            All questions were answered, and informed consent                            was obtained. Prior Anticoagulants: The patient  last took Coumadin (warfarin) 5 days and Lovenox                            (enoxaparin) 2 days prior to the procedure. ASA                            Grade Assessment: III - A patient with severe                            systemic disease. After reviewing the risks and                            benefits, the patient was deemed in  satisfactory                            condition to undergo the procedure.                           - Prior to the procedure, a History and Physical                            was performed, and patient medications and                            allergies were reviewed. The patient's tolerance of                            previous anesthesia was also reviewed. The risks                            and benefits of the procedure and the sedation                            options and risks were discussed with the patient.                            All questions were answered, and informed consent                            was obtained. Prior Anticoagulants: The patient                            last took Coumadin (warfarin) 5 days and Lovenox                            (enoxaparin) 2 days prior to the procedure. ASA                            Grade Assessment: III - A patient with severe                            systemic disease. After reviewing the risks and  benefits, the patient was deemed in satisfactory                            condition to undergo the procedure.                           After obtaining informed consent, the colonoscope                            was passed under direct vision. Throughout the                            procedure, the patient's blood pressure, pulse, and                            oxygen saturations were monitored continuously. The                            PCF-H190DL ([id number] Olympus pediatric colonscope                            was introduced through the anus and advanced to the                            the terminal ileum, with identification of the                            appendiceal orifice and IC valve. The colonoscopy                            was performed without difficulty. The patient                            tolerated the procedure well. The quality of the                            bowel  preparation was good. Scope In: 8:53:08 AM Scope Out: 9:05:41 AM Scope Withdrawal Time: 0 hours 6 minutes 50 seconds  Total Procedure Duration: 0 hours 12 minutes 33 seconds  Findings:      Hemorrhoids were found on perianal exam.      The terminal ileum appeared normal.      A few diverticula were found in the sigmoid colon, transverse colon and       hepatic flexure.      Internal hemorrhoids were found during retroflexion. The hemorrhoids       were medium-sized. Impression:               - Hemorrhoids found on perianal exam.                           - The examined portion of the ileum was normal.                           - Diverticulosis in the sigmoid colon, in the  transverse colon and at the hepatic flexure.                           - Internal hemorrhoids.                           - No specimens collected. Moderate Sedation:      Moderate (conscious) sedation was personally administered by an       anesthesia professional. The following parameters were monitored: oxygen       saturation, heart rate, blood pressure, and response to care. Recommendation:           - Patient has a contact number available for                            emergencies. The signs and symptoms of potential                            delayed complications were discussed with the                            patient. Return to normal activities tomorrow.                            Written discharge instructions were provided to the                            patient.                           - Resume previous diet.                           - Continue present medications.                           - Repeat colonoscopy in 5 years for screening                            purposes.                           - Return to my office PRN.                           - Resume Coumadin (warfarin) today and Lovenox                            (enoxaparin) today at prior doses. Procedure  Code(s):        --- Professional ---                           [phone], Colonoscopy, flexible; diagnostic, including                            collection of specimen(s) by brushing or washing,  when performed (separate procedure) Diagnosis Code(s):        --- Professional ---                           K64.8, Other hemorrhoids                           K62.5, Hemorrhage of anus and rectum                           Z83.71, Family history of colonic polyps                           K57.30, Diverticulosis of large intestine without                            perforation or abscess without bleeding CPT copyright 2019 American Medical Association. All rights reserved. The codes documented in this report are preliminary and upon coder review may  be revised to meet current compliance requirements. Kathi Der, MD Kathi Der, MD 04/07/2019 9:20:49 AM Number of Addenda: 0

## 2019-04-07 NOTE — Anesthesia Postprocedure Evaluation (Signed)
Anesthesia Post Note  Patient: Amy Rivers  Procedure(s) Performed: COLONOSCOPY WITH PROPOFOL (N/A ) ESOPHAGOGASTRODUODENOSCOPY (EGD) WITH PROPOFOL (N/A ) BIOPSY MALONEY DILATION     Patient location during evaluation: Endoscopy Anesthesia Type: MAC Level of consciousness: awake and alert Pain management: pain level controlled Vital Signs Assessment: post-procedure vital signs reviewed and stable Respiratory status: spontaneous breathing, nonlabored ventilation and respiratory function stable Cardiovascular status: stable and blood pressure returned to baseline Postop Assessment: no apparent nausea or vomiting Anesthetic complications: no    Last Vitals:  Vitals:   04/07/19 0920 04/07/19 0928  BP: (!) 136/99 (!) 144/80  Pulse: 69 60  Resp: (!) 23 15  Temp:    SpO2: 100% 100%    Last Pain:  Vitals:   04/07/19 0928  TempSrc:   PainSc: 3                  Catalina Gravel

## 2019-04-10 ENCOUNTER — Encounter: Payer: Self-pay | Admitting: *Deleted

## 2019-04-11 LAB — SURGICAL PATHOLOGY

## 2019-04-13 ENCOUNTER — Other Ambulatory Visit: Payer: Self-pay

## 2019-04-13 ENCOUNTER — Ambulatory Visit (INDEPENDENT_AMBULATORY_CARE_PROVIDER_SITE_OTHER): Payer: Medicare Other | Admitting: *Deleted

## 2019-04-13 DIAGNOSIS — Z5181 Encounter for therapeutic drug level monitoring: Secondary | ICD-10-CM

## 2019-04-13 DIAGNOSIS — I2699 Other pulmonary embolism without acute cor pulmonale: Secondary | ICD-10-CM

## 2019-04-13 LAB — POCT INR: INR: 2 (ref 2.0–3.0)

## 2019-04-13 NOTE — Patient Instructions (Signed)
Description   Stop Lovenox, continue taking 1/2 tablet daily except 1 tablet on Sundays and Thursdays. Recheck INR in 3 weeks. Call us with any concerns,  Main [phone] [facility] [phone].

## 2019-04-14 ENCOUNTER — Other Ambulatory Visit: Payer: Self-pay | Admitting: Physical Medicine and Rehabilitation

## 2019-04-14 MED ORDER — METHOCARBAMOL 500 MG PO TABS: 500.0000 mg | ORAL_TABLET | Freq: Four times a day (QID) | ORAL | 1 refills | Status: DC | PRN

## 2019-05-11 ENCOUNTER — Ambulatory Visit (INDEPENDENT_AMBULATORY_CARE_PROVIDER_SITE_OTHER): Payer: Medicare Other | Admitting: Cardiology

## 2019-05-11 ENCOUNTER — Other Ambulatory Visit: Payer: Self-pay

## 2019-05-11 ENCOUNTER — Encounter: Payer: Self-pay | Admitting: Cardiology

## 2019-05-11 VITALS — BP 114/76 | HR 54 | Resp 15 | Ht 64.5 in | Wt 207.6 lb

## 2019-05-11 DIAGNOSIS — Z86711 Personal history of pulmonary embolism: Secondary | ICD-10-CM | POA: Diagnosis not present

## 2019-05-11 DIAGNOSIS — Z720 Tobacco use: Secondary | ICD-10-CM | POA: Diagnosis not present

## 2019-05-11 DIAGNOSIS — I2510 Atherosclerotic heart disease of native coronary artery without angina pectoris: Secondary | ICD-10-CM | POA: Diagnosis not present

## 2019-05-11 NOTE — Patient Instructions (Signed)
Medication Instructions:  The current medical regimen is effective;  continue present plan and medications.  *If you need a refill on your cardiac medications before your next appointment, please call your pharmacy*  Follow-Up: At [facility], you and your health needs are our priority.  As part of our continuing mission to provide you with exceptional heart care, we have created designated Provider Care Teams.  These Care Teams include your primary Cardiologist (physician) and Advanced Practice Providers (APPs -  Physician Assistants and Nurse Practitioners) who all work together to provide you with the care you need, when you need it.  We recommend signing up for the patient portal called "MyChart".  Sign up information is provided on this After Visit Summary.  MyChart is used to connect with patients for Virtual Visits (Telemedicine).  Patients are able to view lab/test results, encounter notes, upcoming appointments, etc.  Non-urgent messages can be sent to your provider as well.   To learn more about what you can do with MyChart, go to [email or web address].    Your next appointment:   6 month(s)  The format for your next appointment:   In Person  Provider:   Norma Fredrickson, NP AND 1 YEAR WITH DR Anne Fu.   Thank you for choosing [facility]!!     Biotin

## 2019-05-11 NOTE — Progress Notes (Signed)
Cardiology Office Note:    Date:  05/11/2019   ID:  [name], DOB 11/15/1965, MRN [id number]  PCP:  Vladimir Crofts, FNP  Cardiologist:  Donato Schultz, MD  Electrophysiologist:  None   Referring MD: Jackie Plum, MD    History of Present Illness:    Amy Rivers is a 54 y.o. 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  Prior history of smoking.  Tried Eliquis but unsuccessful.  Had not tolerated isosorbide in the past.  Occasionally will use as needed nitro.  Lovenox bridging was used for colonoscopy.  Was having sharp pains in chest. Lots of GERD.   She has 14 grandchildren she states.  89 that is 5 years old was looking for a house that she could stay with them.  She was touched.  Overall she seems to doing quite well.   Past Medical History:  Diagnosis Date  . Allergic rhinitis   . Anticoagulant long-term use   . Bipolar 1 disorder (HCC)   . CAD (coronary artery disease), native coronary artery    ANGINA PRESENCE UNSPECIFIED  . CHF (congestive heart failure) (HCC)   . COPD (chronic obstructive pulmonary disease) (HCC)   . DDD (degenerative disc disease), lumbar   . Fibromyalgia   . Fibromyalgia   . GERD (gastroesophageal reflux disease)   . H/O degenerative disc disease   . Hx of tobacco use, presenting hazards to health   . Hyperlipidemia   . Hypertension   . Low back pain   . Obesity (BMI 30.0-34.9)   . Other chronic pain   . Pulmonary embolism (HCC)   . Scoliosis     Past Surgical History:  Procedure Laterality Date  . ABDOMINAL HYSTERECTOMY    . ABDOMINAL SURGERY    . BIOPSY  04/07/2019   Procedure: BIOPSY;  Surgeon: Kathi Der, MD;  Location: [facility];  Service: Gastroenterology;;  . CARDIAC SURGERY    . COLONOSCOPY WITH PROPOFOL N/A 04/07/2019   Procedure: COLONOSCOPY WITH PROPOFOL;  Surgeon: Kathi Der, MD;  Location: [facility];  Service: Gastroenterology;   Laterality: N/A;  . ESOPHAGOGASTRODUODENOSCOPY (EGD) WITH PROPOFOL N/A 04/07/2019   Procedure: ESOPHAGOGASTRODUODENOSCOPY (EGD) WITH PROPOFOL;  Surgeon: Kathi Der, MD;  Location: [facility];  Service: Gastroenterology;  Laterality: N/A;  . MALONEY DILATION  04/07/2019   Procedure: Elease Hashimoto DILATION;  Surgeon: Kathi Der, MD;  Location: [facility];  Service: Gastroenterology;;    Current Medications: Current Meds  Medication Sig  . albuterol (ACCUNEB) 1.25 MG/3ML nebulizer solution Take 1 ampule by nebulization every 6 (six) hours as needed for wheezing.  Marland Kitchen amLODipine (NORVASC) 2.5 MG tablet Take 2.5 mg by mouth daily.  Marland Kitchen atorvastatin (LIPITOR) 80 MG tablet Take 80 mg by mouth at bedtime.  . dicyclomine (BENTYL) 20 MG tablet Take 20 mg by mouth daily as needed (stomach pain).   Marland Kitchen enoxaparin (LOVENOX) 150 MG/ML injection Inject 1 mL (150 mg total) into the skin daily.  Marland Kitchen gabapentin (NEURONTIN) 100 MG capsule Take 100 mg by mouth 3 (three) times daily.   . hydrOXYzine (ATARAX/VISTARIL) 50 MG tablet Take 50 mg by mouth at bedtime.   . Melatonin 5 MG TABS Take 5 mg by mouth at bedtime as needed (Sleep).  . metoprolol tartrate (LOPRESSOR) 50 MG tablet Take 1 tablet (50 mg total) by mouth 2 (two) times daily.  . montelukast (SINGULAIR) 10 MG tablet Take 10 mg by mouth daily.  . nitroGLYCERIN (NITROSTAT) 0.4  MG SL tablet Place 1 tablet (0.4 mg total) under the tongue every 5 (five) minutes x 3 doses as needed for chest pain.  . pantoprazole (PROTONIX) 40 MG tablet Take 1 tablet (40 mg total) by mouth daily.  . polyethylene glycol powder (GLYCOLAX/MIRALAX) 17 GM/SCOOP powder Take 17 g by mouth daily.  . prazosin (MINIPRESS) 2 MG capsule Take 2 mg by mouth at bedtime.  Marland Kitchen QUEtiapine (SEROQUEL) 100 MG tablet Take 100 mg by mouth at bedtime.   . topiramate (TOPAMAX) 100 MG tablet Take 100 mg by mouth at bedtime.   . traMADol (ULTRAM) 50 MG tablet Take 50 mg by mouth daily as needed for  moderate pain or severe pain.   . VENTOLIN HFA 108 (90 Base) MCG/ACT inhaler Inhale 2 puffs into the lungs 2 (two) times daily.   Marland Kitchen VRAYLAR 4.5 MG CAPS Take 4.5 mg by mouth daily.   Marland Kitchen warfarin (COUMADIN) 5 MG tablet Take 1/2 tablet daily except 1 tablet on Sunday and Thursday or TAKE AS DIRECTED BY COUMADIN CLINIC (Patient taking differently: Take 2.5-5 mg by mouth See admin instructions. Take 2.5 mg  tablet daily except 5 mg tablet on Sunday and Thursday or TAKE AS DIRECTED BY COUMADIN CLINIC)     Allergies:   Keflet [cephalexin], Morphine and related, Sulfa antibiotics, Iodine, Lyrica [pregabalin], Strawberry extract, and Cymbalta [duloxetine hcl]   Social History   Socioeconomic History  . Marital status: Widowed    Spouse name: Not on file  . Number of children: Not on file  . Years of education: Not on file  . Highest education level: Not on file  Occupational History  . Not on file  Tobacco Use  . Smoking status: Current Every Day Smoker  . Smokeless tobacco: Never Used  Substance and Sexual Activity  . Alcohol use: No  . Drug use: No  . Sexual activity: Not on file  Other Topics Concern  . Not on file  Social History Narrative  . Not on file   Social Determinants of Health   Financial Resource Strain:   . Difficulty of Paying Living Expenses:   Food Insecurity:   . Worried About Charity fundraiser in the Last Year:   . Arboriculturist in the Last Year:   Transportation Needs:   . Film/video editor (Medical):   Marland Kitchen Lack of Transportation (Non-Medical):   Physical Activity:   . Days of Exercise per Week:   . Minutes of Exercise per Session:   Stress:   . Feeling of Stress :   Social Connections:   . Frequency of Communication with Friends and Family:   . Frequency of Social Gatherings with Friends and Family:   . Attends Religious Services:   . Active Member of Clubs or Organizations:   . Attends Archivist Meetings:   Marland Kitchen Marital Status:       Family History: The patient's family history includes Arthritis in her father; Hepatitis in her mother.  ROS:   Please see the history of present illness.     All other systems reviewed and are negative.  EKGs/Labs/Other Studies Reviewed:    The following studies were reviewed today: Prior hospitalization reviewed  EKG:  EKG is not ordered today.    Recent Labs: No results found for requested labs within last 8760 hours.  Recent Lipid Panel    Component Value Date/Time   CHOL 139 06/03/2017 1456   TRIG 88 06/03/2017 1456   HDL  47 06/03/2017 1456   CHOLHDL 3.0 06/03/2017 1456   LDLCALC 74 06/03/2017 1456    Physical Exam:    VS:  BP 114/76   Pulse (!) 54   Resp 15   Ht 5' 4.5" (1.638 m)   Wt 207 lb 9.6 oz (94.2 kg)   LMP  (LMP Unknown)   SpO2 98%   BMI 35.08 kg/m     Wt Readings from Last 3 Encounters:  05/11/19 207 lb 9.6 oz (94.2 kg)  04/07/19 295 lb (133.8 kg)  03/01/19 204 lb 12.8 oz (92.9 kg)     GEN:  Well nourished, well developed in no acute distress HEENT: Normal NECK: No JVD; No carotid bruits LYMPHATICS: No lymphadenopathy CARDIAC: RRR, no murmurs, rubs, gallops RESPIRATORY:  Clear to auscultation without rales, wheezing or rhonchi  ABDOMEN: Soft, non-tender, non-distended MUSCULOSKELETAL:  No edema; No deformity  SKIN: Warm and dry NEUROLOGIC:  Alert and oriented x 3 PSYCHIATRIC:  Normal affect   ASSESSMENT:    1. Coronary artery disease involving native coronary artery of native heart without angina pectoris   2. History of pulmonary embolism   3. Tobacco abuse    PLAN:    In order of problems listed above:  CAD post CABG x1 -Overall doing well.  Sharp chest pains are to be expected at times.  Reassurance.  Continue with risk factor modification.  Prior PE recurrence -On Coumadin  GERD -PPI therapy.  Tobacco use -He is down to 7 cigarettes a day.  Excellent.  Continue to cut back.  She wants to be here for her  grandkids.  Hyperlipidemia -LDL 74 in the past.  Continue with statin therapy.  Hair loss -She has been taking metoprolol.  Suggested possible biotin supplement.   Medication Adjustments/Labs and Tests Ordered: Current medicines are reviewed at length with the patient today.  Concerns regarding medicines are outlined above.  No orders of the defined types were placed in this encounter.  No orders of the defined types were placed in this encounter.   Patient Instructions  Medication Instructions:  The current medical regimen is effective;  continue present plan and medications.  *If you need a refill on your cardiac medications before your next appointment, please call your pharmacy*  Follow-Up: At [facility], you and your health needs are our priority.  As part of our continuing mission to provide you with exceptional heart care, we have created designated Provider Care Teams.  These Care Teams include your primary Cardiologist (physician) and Advanced Practice Providers (APPs -  Physician Assistants and Nurse Practitioners) who all work together to provide you with the care you need, when you need it.  We recommend signing up for the patient portal called "MyChart".  Sign up information is provided on this After Visit Summary.  MyChart is used to connect with patients for Virtual Visits (Telemedicine).  Patients are able to view lab/test results, encounter notes, upcoming appointments, etc.  Non-urgent messages can be sent to your provider as well.   To learn more about what you can do with MyChart, go to [email or web address].    Your next appointment:   6 month(s)  The format for your next appointment:   In Person  Provider:   Norma Fredrickson, NP AND 1 YEAR WITH DR Anne Fu.   Thank you for choosing [facility]!!     Biotin     Signed, Donato Schultz, MD  05/11/2019 3:13 PM    [facility]

## 2019-05-12 ENCOUNTER — Ambulatory Visit (INDEPENDENT_AMBULATORY_CARE_PROVIDER_SITE_OTHER): Payer: Medicare Other | Admitting: *Deleted

## 2019-05-12 DIAGNOSIS — I2699 Other pulmonary embolism without acute cor pulmonale: Secondary | ICD-10-CM

## 2019-05-12 DIAGNOSIS — Z5181 Encounter for therapeutic drug level monitoring: Secondary | ICD-10-CM | POA: Diagnosis not present

## 2019-05-12 LAB — POCT INR: INR: 2 (ref 2.0–3.0)

## 2019-05-12 NOTE — Patient Instructions (Addendum)
Description   Today take 1 tablet then continue taking 1/2 tablet daily except 1 tablet on Sundays and Thursdays. Recheck INR in 2 weeks. Call us with any concerns,  Main [phone] [facility]in [facility] [phone].

## 2019-05-26 ENCOUNTER — Ambulatory Visit (INDEPENDENT_AMBULATORY_CARE_PROVIDER_SITE_OTHER): Payer: Medicare Other | Admitting: *Deleted

## 2019-05-26 ENCOUNTER — Other Ambulatory Visit: Payer: Self-pay

## 2019-05-26 DIAGNOSIS — I2699 Other pulmonary embolism without acute cor pulmonale: Secondary | ICD-10-CM | POA: Diagnosis not present

## 2019-05-26 DIAGNOSIS — Z5181 Encounter for therapeutic drug level monitoring: Secondary | ICD-10-CM | POA: Diagnosis not present

## 2019-05-26 LAB — POCT INR: INR: 1.6 — AB (ref 2.0–3.0)

## 2019-05-26 NOTE — Patient Instructions (Signed)
Description   Today take 1 tablet then continue taking 1/2 tablet daily except 1 tablet on Sundays and Thursdays. Recheck INR in 2 weeks. Call us with any concerns,  Main [phone] Coumadin Clinic [phone].

## 2019-07-12 ENCOUNTER — Other Ambulatory Visit: Payer: Self-pay

## 2019-07-12 ENCOUNTER — Ambulatory Visit (INDEPENDENT_AMBULATORY_CARE_PROVIDER_SITE_OTHER): Payer: Medicare Other | Admitting: *Deleted

## 2019-07-12 DIAGNOSIS — I2699 Other pulmonary embolism without acute cor pulmonale: Secondary | ICD-10-CM

## 2019-07-12 DIAGNOSIS — Z5181 Encounter for therapeutic drug level monitoring: Secondary | ICD-10-CM

## 2019-07-12 LAB — POCT INR: INR: 1.4 — AB (ref 2.0–3.0)

## 2019-07-12 NOTE — Patient Instructions (Signed)
Description   Today take 1 tablet and tomorrow take 1.5 tablets then continue taking 1/2 tablet daily except 1 tablet on Sundays and Thursdays. Recheck INR in 1 week. Call us with any concerns,  Main [phone] Coumadin Clinic [phone].

## 2019-08-03 ENCOUNTER — Other Ambulatory Visit: Payer: Self-pay | Admitting: Cardiology

## 2019-09-14 ENCOUNTER — Telehealth: Payer: Self-pay

## 2019-09-14 NOTE — Telephone Encounter (Signed)
lmom for overdue inr 

## 2019-09-21 ENCOUNTER — Telehealth: Payer: Self-pay | Admitting: Pharmacist

## 2019-09-21 NOTE — Telephone Encounter (Signed)
Called pt since she is overdue for INR check. She states she had level checked at PCP office yesterday and that they will be following her INRs now. Anticoag episode of care has been unlinked.

## 2019-11-02 ENCOUNTER — Telehealth: Payer: Self-pay | Admitting: *Deleted

## 2019-11-02 NOTE — Telephone Encounter (Signed)
  Patient Consent for Virtual Visit         Amy Rivers has provided verbal consent on 11/02/2019 for a virtual visit (video or telephone).   CONSENT FOR VIRTUAL VISIT FOR:  Amy Rivers  By participating in this virtual visit I agree to the following:  I hereby voluntarily request, consent and authorize [facility] and its employed or contracted physicians, physician assistants, nurse practitioners or other licensed health care professionals (the Practitioner), to provide me with telemedicine health care services (the "Services") as deemed necessary by the treating Practitioner. I acknowledge and consent to receive the Services by the Practitioner via telemedicine. I understand that the telemedicine visit will involve communicating with the Practitioner through live audiovisual communication technology and the disclosure of certain medical information by electronic transmission. I acknowledge that I have been given the opportunity to request an in-person assessment or other available alternative prior to the telemedicine visit and am voluntarily participating in the telemedicine visit.  I understand that I have the right to withhold or withdraw my consent to the use of telemedicine in the course of my care at any time, without affecting my right to future care or treatment, and that the Practitioner or I may terminate the telemedicine visit at any time. I understand that I have the right to inspect all information obtained and/or recorded in the course of the telemedicine visit and may receive copies of available information for a reasonable fee.  I understand that some of the potential risks of receiving the Services via telemedicine include:  Marland Kitchen Delay or interruption in medical evaluation due to technological equipment failure or disruption; . Information transmitted may not be sufficient (e.g. poor resolution of images) to allow for appropriate medical decision making by the Practitioner;  and/or  . In rare instances, security protocols could fail, causing a breach of personal health information.  Furthermore, I acknowledge that it is my responsibility to provide information about my medical history, conditions and care that is complete and accurate to the best of my ability. I acknowledge that Practitioner's advice, recommendations, and/or decision may be based on factors not within their control, such as incomplete or inaccurate data provided by me or distortions of diagnostic images or specimens that may result from electronic transmissions. I understand that the practice of medicine is not an exact science and that Practitioner makes no warranties or guarantees regarding treatment outcomes. I acknowledge that a copy of this consent can be made available to me via my patient portal [facility] MyChart), or I can request a printed copy by calling the office of [facility].    I understand that my insurance will be billed for this visit.   I have read or had this consent read to me. . I understand the contents of this consent, which adequately explains the benefits and risks of the Services being provided via telemedicine.  . I have been provided ample opportunity to ask questions regarding this consent and the Services and have had my questions answered to my satisfaction. . I give my informed consent for the services to be provided through the use of telemedicine in my medical care

## 2019-11-06 NOTE — Progress Notes (Signed)
Telehealth Visit     Virtual Visit via Telephone Note   This visit type was conducted due to national recommendations for restrictions regarding the COVID-19 Pandemic (e.g. social distancing) in an effort to limit this patient's exposure and mitigate transmission in our community.  Due to her co-morbid illnesses, this patient is at least at moderate risk for complications without adequate follow up.  This format is felt to be most appropriate for this patient at this time.  The patient did not have access to video technology/had technical difficulties with video requiring transitioning to audio format only (telephone).  All issues noted in this document were discussed and addressed.  No physical exam could be performed with this format.  Please refer to the patient's chart for her  consent to telehealth for [facility].   Evaluation Performed:  Follow-up visit   The patient was identified using 2 identifiers.   This visit type was conducted due to national recommendations for restrictions regarding the COVID-19 Pandemic (e.g. social distancing).  This format is felt to be most appropriate for this patient at this time.  All issues noted in this document were discussed and addressed.  No physical exam was performed (except for noted visual exam findings with Video Visits).  Please refer to the patient's chart (MyChart message for video visits and phone note for telephone visits) for the patient's consent to telehealth for [facility].  Date:  11/08/2019   ID:  Amy Rivers, DOB [birth year], MRN [id number]  Patient Location:  Unknown  Provider location:   Home  PCP:  Vladimir Crofts, FNP (Inactive)  Cardiologist:  Jerrye Bushy, MD  Electrophysiologist:  None   Chief Complaint:  Follow up  History of Present Illness:    Amy Rivers is a 54 y.o. female who presents via audio/video conferencing for a telehealth visit today.  Seen for Dr. Anne Fu.   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 Has been tried on Eliquis but unsuccessful. Has had tendency towards chronic chest pain. She does use some prn NTG.   Last seen in March by Dr. Anne Fu - felt to be doing well. Has continued to smoke.   Unknown if the patient has symptoms concerning for COVID-19 infection (fever, chills, cough, or new shortness of breath).   Did not answer call for visit today. Left message to call the office to reschedule.   Past Medical History:  Diagnosis Date  . Allergic rhinitis   . Anticoagulant long-term use   . Bipolar 1 disorder (HCC)   . CAD (coronary artery disease), native coronary artery    ANGINA PRESENCE UNSPECIFIED  . CHF (congestive heart failure) (HCC)   . COPD (chronic obstructive pulmonary disease) (HCC)   . DDD (degenerative disc disease), lumbar   . Fibromyalgia   . Fibromyalgia   . GERD (gastroesophageal reflux disease)   . H/O degenerative disc disease   . Hx of tobacco use, presenting hazards to health   . Hyperlipidemia   . Hypertension   . Low back pain   . Obesity (BMI 30.0-34.9)   . Other chronic pain   . Pulmonary embolism (HCC)   . Scoliosis    Past Surgical History:  Procedure Laterality Date  . ABDOMINAL HYSTERECTOMY    . ABDOMINAL SURGERY    . BIOPSY  04/07/2019   Procedure: BIOPSY;  Surgeon: Kathi Der, MD;  Location:  [facility];  Service: Gastroenterology;;  . CARDIAC SURGERY    . COLONOSCOPY WITH PROPOFOL N/A 04/07/2019   Procedure: COLONOSCOPY WITH PROPOFOL;  Surgeon: Kathi Der, MD;  Location: [facility];  Service: Gastroenterology;  Laterality: N/A;  . ESOPHAGOGASTRODUODENOSCOPY (EGD) WITH PROPOFOL N/A 04/07/2019   Procedure: ESOPHAGOGASTRODUODENOSCOPY (EGD) WITH PROPOFOL;  Surgeon: Kathi Der, MD;  Location: [facility];  Service: Gastroenterology;  Laterality: N/A;  . MALONEY DILATION  04/07/2019   Procedure: Elease Hashimoto  DILATION;  Surgeon: Kathi Der, MD;  Location: [facility];  Service: Gastroenterology;;     No outpatient medications have been marked as taking for the 11/13/19 encounter (Appointment) with Rosalio Macadamia, NP.     Allergies:   Keflet [cephalexin], Morphine and related, Sulfa antibiotics, Iodine, Lyrica [pregabalin], Strawberry extract, and Cymbalta [duloxetine hcl]   Social History   Tobacco Use  . Smoking status: Current Every Day Smoker  . Smokeless tobacco: Never Used  Vaping Use  . Vaping Use: Never used  Substance Use Topics  . Alcohol use: No  . Drug use: No     Family Hx: The patient's family history includes Arthritis in her father; Hepatitis in her mother.  ROS:   Please see the history of present illness.     Objective:    Vital Signs:  LMP  (LMP Unknown)    Wt Readings from Last 3 Encounters:  05/11/19 207 lb 9.6 oz (94.2 kg)  04/07/19 295 lb (133.8 kg)  03/01/19 204 lb 12.8 oz (92.9 kg)    Alert female in no acute distress.   Labs/Other Tests and Data Reviewed:    Lab Results  Component Value Date   WBC 5.7 06/03/2017   HGB 12.0 06/03/2017   HCT 38.3 06/03/2017   PLT 298 06/03/2017   GLUCOSE 68 06/03/2017   CHOL 139 06/03/2017   TRIG 88 06/03/2017   HDL 47 06/03/2017   LDLCALC 74 06/03/2017   ALT 11 06/03/2017   AST 21 06/03/2017   NA 144 06/03/2017   K 4.4 06/03/2017   CL 108 (H) 06/03/2017   CREATININE 0.88 06/03/2017   BUN 8 06/03/2017   CO2 22 06/03/2017   INR 1.4 (A) 07/12/2019     BNP (last 3 results) No results for input(s): BNP in the last 8760 hours.  ProBNP (last 3 results) No results for input(s): PROBNP in the last 8760 hours.    Prior CV studies:    The following studies were reviewed today:  Nuclear stress test 06/16/2017:  Nuclear stress EF: 57%.  During infusion there was accentuation of the T wave abnormalities noted on baseline EKG  The study is normal.  This is a low risk study.  The left  ventricular ejection fraction is normal (55-65%).   ASSESSMENT & PLAN:    1. CAD with prior CABG x 1 - low risk Myoview from 2019  2. HTN  3. HLD  4. Tobacco abuse  5. Prior PE - on coumadin  6. Chronic anticoagulation  7. GERD  8. Bipolar illness  Patient Risk:   After full review of this patient's clinical status, I feel that they are at least moderate risk at this time.  Time:   Today, I have spent 0 minutes with the patient with telehealth technology discussing the above issues.     Medication Adjustments/Labs and Tests Ordered: Current medicines are reviewed at length with the patient today.  Concerns regarding medicines are outlined above.   Tests Ordered: No orders of the defined  types were placed in this encounter.   Medication Changes: No orders of the defined types were placed in this encounter.   Disposition:  Message left to reschedule appointment.    Patient is agreeable to this plan and will call if any problems develop in the interim.   Avelina Laine, NP  11/08/2019 8:29 AM    [facility]

## 2019-11-13 ENCOUNTER — Other Ambulatory Visit: Payer: Self-pay

## 2019-11-13 ENCOUNTER — Telehealth (INDEPENDENT_AMBULATORY_CARE_PROVIDER_SITE_OTHER): Payer: Medicare Other | Admitting: Nurse Practitioner

## 2019-11-13 ENCOUNTER — Encounter: Payer: Self-pay | Admitting: Nurse Practitioner

## 2019-11-13 ENCOUNTER — Telehealth: Payer: Self-pay | Admitting: *Deleted

## 2019-11-13 DIAGNOSIS — I10 Essential (primary) hypertension: Secondary | ICD-10-CM

## 2019-11-13 DIAGNOSIS — E782 Mixed hyperlipidemia: Secondary | ICD-10-CM

## 2019-11-13 DIAGNOSIS — Z7901 Long term (current) use of anticoagulants: Secondary | ICD-10-CM

## 2019-11-13 DIAGNOSIS — Z720 Tobacco use: Secondary | ICD-10-CM

## 2019-11-13 DIAGNOSIS — I2510 Atherosclerotic heart disease of native coronary artery without angina pectoris: Secondary | ICD-10-CM

## 2019-11-13 DIAGNOSIS — Z7189 Other specified counseling: Secondary | ICD-10-CM

## 2019-11-13 NOTE — Telephone Encounter (Signed)
LVM X 2 to get pt ready for telehealth visit. Lvm pt will have too call office to reschedule.

## 2019-11-13 NOTE — Patient Instructions (Signed)
After Visit Summary:  We will be checking the following labs today - NONE   Medication Instructions:    Continue with your current medicines.    If you need a refill on your cardiac medications before your next appointment, please call your pharmacy.     Testing/Procedures To Be Arranged:  N/A  Follow-Up:   See     At [facility], you and your health needs are our priority.  As part of our continuing mission to provide you with exceptional heart care, we have created designated Provider Care Teams.  These Care Teams include your primary Cardiologist (physician) and Advanced Practice Providers (APPs -  Physician Assistants and Nurse Practitioners) who all work together to provide you with the care you need, when you need it.  Special Instructions:  . Stay safe, wash your hands for at least 20 seconds and wear a mask when needed.  . It was good to talk with you today.    Call the [facility] office at [phone] if you have any questions, problems or concerns.
